# Patient Record
Sex: Male | Born: 1955
Health system: Southern US, Community
[De-identification: ages and names within clinical notes are randomized; demographics above are authoritative.]

## PROBLEM LIST (undated history)

## (undated) ENCOUNTER — Emergency Department (HOSPITAL_BASED_OUTPATIENT_CLINIC_OR_DEPARTMENT_OTHER): Payer: Self-pay | Source: Home / Self Care

## (undated) DIAGNOSIS — K458 Other specified abdominal hernia without obstruction or gangrene: Secondary | ICD-10-CM

## (undated) DIAGNOSIS — K219 Gastro-esophageal reflux disease without esophagitis: Secondary | ICD-10-CM

## (undated) DIAGNOSIS — M549 Dorsalgia, unspecified: Secondary | ICD-10-CM

## (undated) DIAGNOSIS — I10 Essential (primary) hypertension: Secondary | ICD-10-CM

## (undated) DIAGNOSIS — J189 Pneumonia, unspecified organism: Secondary | ICD-10-CM

## (undated) HISTORY — PX: FERTILITY SURGERY: SHX945

## (undated) HISTORY — PX: BACK SURGERY: SHX140

---

## 2009-07-19 ENCOUNTER — Emergency Department (HOSPITAL_BASED_OUTPATIENT_CLINIC_OR_DEPARTMENT_OTHER): Admission: EM | Admit: 2009-07-19 | Discharge: 2009-07-19 | Payer: Self-pay | Admitting: Emergency Medicine

## 2010-05-26 ENCOUNTER — Emergency Department (HOSPITAL_BASED_OUTPATIENT_CLINIC_OR_DEPARTMENT_OTHER): Admission: EM | Admit: 2010-05-26 | Discharge: 2010-05-27 | Payer: Self-pay | Admitting: Emergency Medicine

## 2010-07-10 ENCOUNTER — Emergency Department (HOSPITAL_BASED_OUTPATIENT_CLINIC_OR_DEPARTMENT_OTHER): Admission: EM | Admit: 2010-07-10 | Discharge: 2010-07-10 | Payer: Self-pay | Admitting: Emergency Medicine

## 2010-10-20 ENCOUNTER — Emergency Department (HOSPITAL_BASED_OUTPATIENT_CLINIC_OR_DEPARTMENT_OTHER)
Admission: EM | Admit: 2010-10-20 | Discharge: 2010-10-21 | Disposition: A | Discharge status: Home / Self Care | Payer: Self-pay | Attending: Emergency Medicine | Admitting: Emergency Medicine

## 2010-10-20 DIAGNOSIS — M109 Gout, unspecified: Secondary | ICD-10-CM | POA: Insufficient documentation

## 2010-10-20 DIAGNOSIS — M25579 Pain in unspecified ankle and joints of unspecified foot: Secondary | ICD-10-CM | POA: Insufficient documentation

## 2011-11-08 ENCOUNTER — Encounter (HOSPITAL_BASED_OUTPATIENT_CLINIC_OR_DEPARTMENT_OTHER): Payer: Self-pay

## 2011-11-08 ENCOUNTER — Emergency Department (HOSPITAL_BASED_OUTPATIENT_CLINIC_OR_DEPARTMENT_OTHER)
Admission: EM | Admit: 2011-11-08 | Discharge: 2011-11-08 | Disposition: A | Discharge status: Home / Self Care | Payer: Self-pay | Attending: Emergency Medicine | Admitting: Emergency Medicine

## 2011-11-08 DIAGNOSIS — M109 Gout, unspecified: Secondary | ICD-10-CM | POA: Insufficient documentation

## 2011-11-08 DIAGNOSIS — M5430 Sciatica, unspecified side: Secondary | ICD-10-CM | POA: Insufficient documentation

## 2011-11-08 MED ORDER — KETOROLAC TROMETHAMINE 60 MG/2ML IM SOLN
60.0000 mg | Freq: Once | INTRAMUSCULAR | Status: AC
  Administered 2011-11-08: 60 mg via INTRAMUSCULAR
  Filled 2011-11-08: qty 2

## 2011-11-08 MED ORDER — PREDNISONE 10 MG PO TABS: 20.0000 mg | ORAL_TABLET | Freq: Every day | ORAL | Status: DC

## 2011-11-08 MED ORDER — METHOCARBAMOL 750 MG PO TABS: 750.0000 mg | ORAL_TABLET | Freq: Four times a day (QID) | ORAL | Status: AC

## 2011-11-08 MED ORDER — OXYCODONE-ACETAMINOPHEN 5-325 MG PO TABS: 2.0000 | ORAL_TABLET | ORAL | Status: AC | PRN

## 2011-11-08 NOTE — ED Provider Notes (Signed)
History     CSN: [id number]  Arrival date & time 11/08/11  1051   First MD Initiated Contact with Patient 11/08/11 1125      Chief Complaint  Patient presents with  . Back Pain  . Dizziness    (Consider location/radiation/quality/duration/timing/severity/associated sxs/prior treatment) Patient is a 56 y.o. male presenting with back pain. The history is provided by the patient.  Back Pain    patient complains of pain to his right buttock x2 weeks after twisting. Denies any lumbar pain. Pain starts around his right sciatic notch area and radiates down his right leg. No change in his bowel or bladder function. Patient is now dragging his foot. Has been using Motrin with relief. Pain described as sharp worse with standing and movement better with rest. Denies any perineal numbness  Past Medical History  Diagnosis Date  . Gout     History reviewed. No pertinent past surgical history.  History reviewed. No pertinent family history.  History  Substance Use Topics  . Smoking status: Never Smoker   . Smokeless tobacco: Never Used  . Alcohol Use: No      Review of Systems  Musculoskeletal: Positive for back pain.  All other systems reviewed and are negative.    Allergies  Review of patient's allergies indicates no known allergies.  Home Medications   Current Outpatient Rx  Name Route Sig Dispense Refill  . DICLOFENAC SODIUM 50 MG PO TBEC Oral Take 50 mg by mouth 2 (two) times daily.      BP 153/88  Pulse 91  Temp(Src) 98.2 F (36.8 C) (Oral)  Resp 18  Ht 6\' 1"  (1.854 m)  Wt 245 lb (111.131 kg)  BMI 32.32 kg/m2  SpO2 95%  Physical Exam  Nursing note and vitals reviewed. Constitutional: He is oriented to person, place, and time. He appears well-developed and well-nourished.  Non-toxic appearance. No distress.  HENT:  Head: Normocephalic and atraumatic.  Eyes: Conjunctivae, EOM and lids are normal. Pupils are equal, round, and reactive to light.  Neck: Normal  range of motion. Neck supple. No tracheal deviation present. No mass present.  Cardiovascular: Normal rate, regular rhythm and normal heart sounds.  Exam reveals no gallop.   No murmur heard. Pulmonary/Chest: Effort normal and breath sounds normal. No stridor. No respiratory distress. He has no decreased breath sounds. He has no wheezes. He has no rhonchi. He has no rales.  Abdominal: Soft. Normal appearance and bowel sounds are normal. He exhibits no distension. There is no tenderness. There is no rebound and no CVA tenderness.  Musculoskeletal: Normal range of motion. He exhibits no edema and no tenderness.       Pain to palpation right buttock and sciatic notch.  Neurological: He is alert and oriented to person, place, and time. He has normal strength. No cranial nerve deficit or sensory deficit. GCS eye subscore is 4. GCS verbal subscore is 5. GCS motor subscore is 6.       Normal neurological function at low extremity  Skin: Skin is warm and dry. No abrasion and no rash noted.  Psychiatric: He has a normal mood and affect. His speech is normal and behavior is normal.    ED Course  Procedures (including critical care time)  Labs Reviewed - No data to display No results found.   No diagnosis found.    MDM  Patient to be treated for sciatica and discharged home. Given Toradol injection here        Ethelene Browns  Deanna Artis, MD 11/08/11 [phone]

## 2011-11-08 NOTE — Discharge Instructions (Signed)

## 2011-11-08 NOTE — ED Notes (Signed)
Pt states that he has R lower back pain for the past two weeks.  Pain radiates over the R hip and down the R leg.  Pt states that he is also having some nausea and lightheadedness.

## 2012-07-31 ENCOUNTER — Emergency Department (HOSPITAL_BASED_OUTPATIENT_CLINIC_OR_DEPARTMENT_OTHER)
Admission: EM | Admit: 2012-07-31 | Discharge: 2012-07-31 | Disposition: A | Discharge status: Home / Self Care | Payer: Self-pay | Attending: Emergency Medicine | Admitting: Emergency Medicine

## 2012-07-31 ENCOUNTER — Emergency Department (HOSPITAL_BASED_OUTPATIENT_CLINIC_OR_DEPARTMENT_OTHER): Payer: Self-pay

## 2012-07-31 ENCOUNTER — Encounter (HOSPITAL_BASED_OUTPATIENT_CLINIC_OR_DEPARTMENT_OTHER): Payer: Self-pay | Admitting: Emergency Medicine

## 2012-07-31 DIAGNOSIS — R109 Unspecified abdominal pain: Secondary | ICD-10-CM

## 2012-07-31 DIAGNOSIS — R1031 Right lower quadrant pain: Secondary | ICD-10-CM | POA: Insufficient documentation

## 2012-07-31 DIAGNOSIS — J189 Pneumonia, unspecified organism: Secondary | ICD-10-CM | POA: Insufficient documentation

## 2012-07-31 DIAGNOSIS — M109 Gout, unspecified: Secondary | ICD-10-CM | POA: Insufficient documentation

## 2012-07-31 DIAGNOSIS — Z8719 Personal history of other diseases of the digestive system: Secondary | ICD-10-CM | POA: Insufficient documentation

## 2012-07-31 DIAGNOSIS — Z791 Long term (current) use of non-steroidal anti-inflammatories (NSAID): Secondary | ICD-10-CM | POA: Insufficient documentation

## 2012-07-31 DIAGNOSIS — R350 Frequency of micturition: Secondary | ICD-10-CM | POA: Insufficient documentation

## 2012-07-31 DIAGNOSIS — Z79899 Other long term (current) drug therapy: Secondary | ICD-10-CM | POA: Insufficient documentation

## 2012-07-31 HISTORY — DX: Other specified abdominal hernia without obstruction or gangrene: K45.8

## 2012-07-31 HISTORY — DX: Pneumonia, unspecified organism: J18.9

## 2012-07-31 LAB — CBC WITH DIFFERENTIAL/PLATELET
Basophils Absolute: 0 10*3/uL (ref 0.0–0.1)
HCT: 44.8 % (ref 39.0–52.0)
Hemoglobin: 15.4 g/dL (ref 13.0–17.0)
MCH: 27.4 pg (ref 26.0–34.0)
MCHC: 34.4 g/dL (ref 30.0–36.0)
Monocytes Absolute: 0.8 10*3/uL (ref 0.1–1.0)
Neutrophils Relative %: 77 % (ref 43–77)
WBC: 5.7 10*3/uL (ref 4.0–10.5)

## 2012-07-31 LAB — COMPREHENSIVE METABOLIC PANEL
Albumin: 4 g/dL (ref 3.5–5.2)
Calcium: 9.4 mg/dL (ref 8.4–10.5)
Chloride: 101 mEq/L (ref 96–112)
Creatinine, Ser: 1.5 mg/dL — ABNORMAL HIGH (ref 0.50–1.35)
GFR calc non Af Amer: 50 mL/min — ABNORMAL LOW (ref >90)
Glucose, Bld: 92 mg/dL (ref 70–99)
Potassium: 3.9 mEq/L (ref 3.5–5.1)
Sodium: 140 mEq/L (ref 135–145)
Total Protein: 7.4 g/dL (ref 6.0–8.3)

## 2012-07-31 LAB — URINALYSIS, ROUTINE W REFLEX MICROSCOPIC
Leukocytes, UA: NEGATIVE
Nitrite: NEGATIVE
Protein, ur: NEGATIVE mg/dL
Specific Gravity, Urine: 1.019 (ref 1.005–1.030)
pH: 6 (ref 5.0–8.0)

## 2012-07-31 MED ORDER — SODIUM CHLORIDE 0.9 % IV BOLUS (SEPSIS)
1000.0000 mL | Freq: Once | INTRAVENOUS | Status: AC
  Administered 2012-07-31: 1000 mL via INTRAVENOUS

## 2012-07-31 MED ORDER — ONDANSETRON HCL 4 MG/2ML IJ SOLN
4.0000 mg | Freq: Once | INTRAMUSCULAR | Status: AC
  Administered 2012-07-31: 4 mg via INTRAVENOUS
  Filled 2012-07-31: qty 2

## 2012-07-31 MED ORDER — AZITHROMYCIN 250 MG PO TABS
500.0000 mg | ORAL_TABLET | Freq: Once | ORAL | Status: AC
  Administered 2012-07-31: 500 mg via ORAL
  Filled 2012-07-31: qty 2

## 2012-07-31 MED ORDER — IOHEXOL 300 MG/ML  SOLN
80.0000 mL | Freq: Once | INTRAMUSCULAR | Status: AC | PRN
  Administered 2012-07-31: 80 mL via INTRAVENOUS

## 2012-07-31 MED ORDER — MORPHINE SULFATE 4 MG/ML IJ SOLN
4.0000 mg | Freq: Once | INTRAMUSCULAR | Status: AC
  Administered 2012-07-31: 4 mg via INTRAVENOUS
  Filled 2012-07-31: qty 1

## 2012-07-31 MED ORDER — AZITHROMYCIN 250 MG PO TABS: 250.0000 mg | ORAL_TABLET | Freq: Every day | ORAL | Status: DC

## 2012-07-31 MED ORDER — OXYCODONE-ACETAMINOPHEN 5-325 MG PO TABS: 1.0000 | ORAL_TABLET | Freq: Four times a day (QID) | ORAL | Status: DC | PRN

## 2012-07-31 NOTE — ED Notes (Signed)
Pt reports cold symptoms x 3 days, reports headache, nausea, vomited once, has taken tylenol for headache but nothing for cold

## 2012-07-31 NOTE — ED Notes (Signed)
Pt also reports increased frequency of urination, dysuria and difficulty keeping stream going

## 2012-07-31 NOTE — ED Provider Notes (Signed)
History     CSN: [id number]  Arrival date & time 07/31/12  1649   First MD Initiated Contact with Patient 07/31/12 1713      Chief Complaint  Patient presents with  . Urinary Frequency    (Consider location/radiation/quality/duration/timing/severity/associated sxs/prior treatment) HPI Comments: Patient presents with feeling nauseated, frequent urination for the past several days.  Started this morning with right lower quadrant pain and vomiting.  He denies fevers or chills.    Patient is a 56 y.o. male presenting with abdominal pain. The history is provided by the patient.  Abdominal Pain The primary symptoms of the illness include abdominal pain. Episode onset: today. The onset of the illness was sudden. The problem has been gradually worsening.  Associated with: nothing. Additional symptoms associated with the illness include anorexia and frequency. Symptoms associated with the illness do not include chills or constipation.    Past Medical History  Diagnosis Date  . Gout   . Lumbar hernia     History reviewed. No pertinent past surgical history.  No family history on file.  History  Substance Use Topics  . Smoking status: Never Smoker   . Smokeless tobacco: Never Used  . Alcohol Use: No      Review of Systems  Constitutional: Negative for chills.  Gastrointestinal: Positive for abdominal pain and anorexia. Negative for constipation.  Genitourinary: Positive for frequency.  All other systems reviewed and are negative.    Allergies  Review of patient's allergies indicates no known allergies.  Home Medications   Current Outpatient Rx  Name  Route  Sig  Dispense  Refill  . PREDNISONE 10 MG PO TABS   Oral   Take 2 tablets (20 mg total) by mouth daily.   10 tablet   0   . PREGABALIN 100 MG PO CAPS   Oral   Take 100 mg by mouth 2 (two) times daily.         Marland Kitchen DICLOFENAC SODIUM 50 MG PO TBEC   Oral   Take 50 mg by mouth 2 (two) times daily.            BP 144/101  Pulse 115  Temp 99.1 F (37.3 C) (Oral)  Resp 22  Ht 6\' 1"  (1.854 m)  Wt 240 lb (108.863 kg)  BMI 31.66 kg/m2  SpO2 98%  Physical Exam  Nursing note and vitals reviewed. Constitutional: He is oriented to person, place, and time. He appears well-developed and well-nourished.       Appears uncomfortable.  HENT:  Head: Normocephalic and atraumatic.  Mouth/Throat: Oropharynx is clear and moist.  Neck: Normal range of motion. Neck supple.  Cardiovascular: Normal rate and regular rhythm.   No murmur heard. Pulmonary/Chest: Effort normal and breath sounds normal. No respiratory distress.  Abdominal: Soft. He exhibits no distension.       There is ttp in the right lower quadrant without rebound or guarding.  Musculoskeletal: Normal range of motion. He exhibits no edema.  Lymphadenopathy:    He has no cervical adenopathy.  Neurological: He is alert and oriented to person, place, and time.  Skin: Skin is warm and dry. He is not diaphoretic.    ED Course  Procedures (including critical care time)   Labs Reviewed  URINALYSIS, ROUTINE W REFLEX MICROSCOPIC  CBC WITH DIFFERENTIAL  COMPREHENSIVE METABOLIC PANEL   No results found.   No diagnosis found.    MDM  The patient presents here with complaints of feeling nauseated, vomiting, and having  lower abd pain.  He also reports being sick with cough and congestion for the past week.  The workup shows labs that are reassuring, ct that does not show any surgical pathology, and chest xray with the suggestion of a retrocardiac pneumonia.  Will treat with antibiotics, pain meds, return prn.        Geoffery Lyons, MD 07/31/12 [phone]

## 2012-09-02 ENCOUNTER — Emergency Department (HOSPITAL_BASED_OUTPATIENT_CLINIC_OR_DEPARTMENT_OTHER)
Admission: EM | Admit: 2012-09-02 | Discharge: 2012-09-02 | Disposition: A | Discharge status: Home / Self Care | Payer: Self-pay | Attending: Emergency Medicine | Admitting: Emergency Medicine

## 2012-09-02 ENCOUNTER — Encounter (HOSPITAL_BASED_OUTPATIENT_CLINIC_OR_DEPARTMENT_OTHER): Payer: Self-pay | Admitting: *Deleted

## 2012-09-02 DIAGNOSIS — Z79899 Other long term (current) drug therapy: Secondary | ICD-10-CM | POA: Insufficient documentation

## 2012-09-02 DIAGNOSIS — M545 Low back pain, unspecified: Secondary | ICD-10-CM | POA: Insufficient documentation

## 2012-09-02 DIAGNOSIS — Z862 Personal history of diseases of the blood and blood-forming organs and certain disorders involving the immune mechanism: Secondary | ICD-10-CM | POA: Insufficient documentation

## 2012-09-02 DIAGNOSIS — Z8639 Personal history of other endocrine, nutritional and metabolic disease: Secondary | ICD-10-CM | POA: Insufficient documentation

## 2012-09-02 DIAGNOSIS — Z8719 Personal history of other diseases of the digestive system: Secondary | ICD-10-CM | POA: Insufficient documentation

## 2012-09-02 DIAGNOSIS — M549 Dorsalgia, unspecified: Secondary | ICD-10-CM

## 2012-09-02 DIAGNOSIS — R209 Unspecified disturbances of skin sensation: Secondary | ICD-10-CM | POA: Insufficient documentation

## 2012-09-02 MED ORDER — ONDANSETRON 8 MG PO TBDP
ORAL_TABLET | ORAL | Status: AC
  Administered 2012-09-02: 8 mg
  Filled 2012-09-02: qty 1

## 2012-09-02 MED ORDER — CYCLOBENZAPRINE HCL 10 MG PO TABS
10.0000 mg | ORAL_TABLET | Freq: Once | ORAL | Status: AC
  Administered 2012-09-02: 10 mg via ORAL
  Filled 2012-09-02: qty 1

## 2012-09-02 MED ORDER — OXYCODONE-ACETAMINOPHEN 5-325 MG PO TABS: 2.0000 | ORAL_TABLET | ORAL | Status: DC | PRN

## 2012-09-02 MED ORDER — HYDROMORPHONE HCL PF 2 MG/ML IJ SOLN
2.0000 mg | Freq: Once | INTRAMUSCULAR | Status: AC
  Administered 2012-09-02: 2 mg via INTRAMUSCULAR
  Filled 2012-09-02: qty 1

## 2012-09-02 MED ORDER — CYCLOBENZAPRINE HCL 10 MG PO TABS: 10.0000 mg | ORAL_TABLET | Freq: Two times a day (BID) | ORAL | Status: DC | PRN

## 2012-09-02 NOTE — ED Notes (Signed)
Pt remains nauseated at this time ODT Zofran on board x 10 min

## 2012-09-02 NOTE — ED Provider Notes (Signed)
History     CSN: [id number]  Arrival date & time 09/02/12  1800   First MD Initiated Contact with Patient 09/02/12 1906      Chief Complaint  Patient presents with  . Back Pain    (Consider location/radiation/quality/duration/timing/severity/associated sxs/prior treatment) HPI Comments: Patient is planning on getting his back pain fixed, however is attempting to get some medical assistance like medicare or medicaid to help.    Patient is a 57 y.o. male presenting with back pain. The history is provided by the patient.  Back Pain  This is a chronic problem. The current episode started more than 2 days ago. The problem occurs constantly. The problem has not changed since onset.The pain is associated with no known injury. The pain is present in the lumbar spine. The quality of the pain is described as shooting. The pain radiates to the right thigh, right knee and right foot. The pain is at a severity of 10/10. The pain is severe. The symptoms are aggravated by twisting, bending and certain positions. The pain is the same all the time. Stiffness is present all day. Associated symptoms include tingling (R foot). Pertinent negatives include no chest pain, no fever, no abdominal pain, no abdominal swelling, no bowel incontinence, no perianal numbness, no bladder incontinence, no paresthesias, no paresis and no weakness.    Past Medical History  Diagnosis Date  . Gout   . Lumbar hernia     History reviewed. No pertinent past surgical history.  History reviewed. No pertinent family history.  History  Substance Use Topics  . Smoking status: Never Smoker   . Smokeless tobacco: Never Used  . Alcohol Use: No      Review of Systems  Constitutional: Negative for fever.  Respiratory: Negative for cough and shortness of breath.   Cardiovascular: Negative for chest pain.  Gastrointestinal: Negative for abdominal pain and bowel incontinence.  Genitourinary: Negative for bladder  incontinence.  Musculoskeletal: Positive for back pain.  Neurological: Positive for tingling (R foot). Negative for weakness and paresthesias.  All other systems reviewed and are negative.    Allergies  Review of patient's allergies indicates no known allergies.  Home Medications   Current Outpatient Rx  Name  Route  Sig  Dispense  Refill  . AZITHROMYCIN 250 MG PO TABS   Oral   Take 1 tablet (250 mg total) by mouth daily.   4 tablet   0   . DICLOFENAC SODIUM 50 MG PO TBEC   Oral   Take 50 mg by mouth 2 (two) times daily.         . OXYCODONE-ACETAMINOPHEN 5-325 MG PO TABS   Oral   Take 1-2 tablets by mouth every 6 (six) hours as needed for pain.   15 tablet   0   . PREDNISONE 10 MG PO TABS   Oral   Take 2 tablets (20 mg total) by mouth daily.   10 tablet   0   . PREGABALIN 100 MG PO CAPS   Oral   Take 100 mg by mouth 2 (two) times daily.           BP 161/109  Pulse 82  Temp 98.6 F (37 C) (Oral)  Resp 16  Ht 6\' 1"  (1.854 m)  Wt 250 lb (113.399 kg)  BMI 32.98 kg/m2  SpO2 98%  Physical Exam  Nursing note and vitals reviewed. Constitutional: He is oriented to person, place, and time. He appears well-developed and well-nourished. No distress.  HENT:  Head: Normocephalic and atraumatic.  Mouth/Throat: No oropharyngeal exudate.  Eyes: EOM are normal. Pupils are equal, round, and reactive to light.  Neck: Normal range of motion. Neck supple.  Cardiovascular: Normal rate and regular rhythm.  Exam reveals no friction rub.   No murmur heard. Pulmonary/Chest: Effort normal and breath sounds normal. No respiratory distress. He has no wheezes. He has no rales.  Abdominal: He exhibits no distension. There is no tenderness. There is no rebound.  Musculoskeletal: He exhibits no edema.       Lumbar back: He exhibits decreased range of motion and tenderness. He exhibits no spasm.  Neurological: He is alert and oriented to person, place, and time.  Skin: He is not  diaphoretic.    ED Course  Procedures (including critical care time)  Labs Reviewed - No data to display No results found.   1. Back pain       MDM   56 rolled male with history of bulging disc has been seen by spinal surgeon but has not had his back repaired presents with back pain. No acute worsening or acute event that these and a worsening. States the pain is on the right side radiates down the back of his right leg. Has some tingling in his foot. He complains of no bowel or bladder problems. He denies any perianal numbness. He denies fevers and IV drug use. No history of cancer. He has a normal set of vital signs. He has some lower back tenderness to palpation on the right side. He has +1 reflexes in his Achilles bilaterally and good strength and sensation in both his legs. I suspect his pain is due to his chronic back pain. Will give IM Dilaudid and given some pain meds to go home.        Elwin Mocha, MD 09/03/12 [phone]

## 2012-09-02 NOTE — ED Notes (Signed)
Pt c/o lower back pain which radiates down right leg x 3 days 

## 2012-09-06 NOTE — ED Provider Notes (Signed)
I saw and evaluated the patient, reviewed the resident's note and I agree with the findings and plan.  Brenten Janney K Linker, MD 09/06/12 1119 

## 2012-11-29 ENCOUNTER — Other Ambulatory Visit: Payer: Self-pay | Admitting: Neurosurgery

## 2012-11-29 DIAGNOSIS — M47817 Spondylosis without myelopathy or radiculopathy, lumbosacral region: Secondary | ICD-10-CM

## 2012-11-29 DIAGNOSIS — M48061 Spinal stenosis, lumbar region without neurogenic claudication: Secondary | ICD-10-CM

## 2012-11-29 DIAGNOSIS — M5137 Other intervertebral disc degeneration, lumbosacral region: Secondary | ICD-10-CM

## 2012-11-29 DIAGNOSIS — M545 Low back pain: Secondary | ICD-10-CM

## 2012-11-29 DIAGNOSIS — M5126 Other intervertebral disc displacement, lumbar region: Secondary | ICD-10-CM

## 2012-12-06 ENCOUNTER — Ambulatory Visit
Admission: RE | Admit: 2012-12-06 | Discharge: 2012-12-06 | Disposition: A | Discharge status: Home / Self Care | Payer: Medicaid Other | Source: Ambulatory Visit | Attending: Neurosurgery | Admitting: Neurosurgery

## 2012-12-06 DIAGNOSIS — M48061 Spinal stenosis, lumbar region without neurogenic claudication: Secondary | ICD-10-CM

## 2012-12-06 DIAGNOSIS — M545 Low back pain: Secondary | ICD-10-CM

## 2012-12-06 DIAGNOSIS — M5126 Other intervertebral disc displacement, lumbar region: Secondary | ICD-10-CM

## 2012-12-06 DIAGNOSIS — M47817 Spondylosis without myelopathy or radiculopathy, lumbosacral region: Secondary | ICD-10-CM

## 2012-12-06 DIAGNOSIS — M5137 Other intervertebral disc degeneration, lumbosacral region: Secondary | ICD-10-CM

## 2012-12-20 ENCOUNTER — Encounter (HOSPITAL_BASED_OUTPATIENT_CLINIC_OR_DEPARTMENT_OTHER): Payer: Self-pay | Admitting: *Deleted

## 2012-12-20 DIAGNOSIS — Z79899 Other long term (current) drug therapy: Secondary | ICD-10-CM | POA: Insufficient documentation

## 2012-12-20 DIAGNOSIS — Z791 Long term (current) use of non-steroidal anti-inflammatories (NSAID): Secondary | ICD-10-CM | POA: Insufficient documentation

## 2012-12-20 DIAGNOSIS — M51379 Other intervertebral disc degeneration, lumbosacral region without mention of lumbar back pain or lower extremity pain: Secondary | ICD-10-CM | POA: Insufficient documentation

## 2012-12-20 DIAGNOSIS — Z862 Personal history of diseases of the blood and blood-forming organs and certain disorders involving the immune mechanism: Secondary | ICD-10-CM | POA: Insufficient documentation

## 2012-12-20 DIAGNOSIS — Z8639 Personal history of other endocrine, nutritional and metabolic disease: Secondary | ICD-10-CM | POA: Insufficient documentation

## 2012-12-20 DIAGNOSIS — M5137 Other intervertebral disc degeneration, lumbosacral region: Secondary | ICD-10-CM | POA: Insufficient documentation

## 2012-12-20 DIAGNOSIS — Z8739 Personal history of other diseases of the musculoskeletal system and connective tissue: Secondary | ICD-10-CM | POA: Insufficient documentation

## 2012-12-20 NOTE — ED Notes (Signed)
Pt c/o lower back pain x 1 day 

## 2012-12-21 ENCOUNTER — Emergency Department (HOSPITAL_BASED_OUTPATIENT_CLINIC_OR_DEPARTMENT_OTHER)
Admission: EM | Admit: 2012-12-21 | Discharge: 2012-12-21 | Disposition: A | Discharge status: Home / Self Care | Payer: Medicaid Other | Attending: Emergency Medicine | Admitting: Emergency Medicine

## 2012-12-21 DIAGNOSIS — M5136 Other intervertebral disc degeneration, lumbar region: Secondary | ICD-10-CM

## 2012-12-21 MED ORDER — OXYCODONE-ACETAMINOPHEN 5-325 MG PO TABS: 1.0000 | ORAL_TABLET | Freq: Four times a day (QID) | ORAL | Status: DC | PRN

## 2012-12-21 MED ORDER — MELOXICAM 7.5 MG PO TABS: 7.5000 mg | ORAL_TABLET | Freq: Every day | ORAL | Status: DC

## 2012-12-21 MED ORDER — KETOROLAC TROMETHAMINE 60 MG/2ML IM SOLN
60.0000 mg | Freq: Once | INTRAMUSCULAR | Status: AC
  Administered 2012-12-21: 60 mg via INTRAMUSCULAR
  Filled 2012-12-21: qty 2

## 2012-12-21 MED ORDER — METHOCARBAMOL 500 MG PO TABS: 500.0000 mg | ORAL_TABLET | Freq: Two times a day (BID) | ORAL | Status: DC

## 2012-12-21 MED ORDER — METHOCARBAMOL 500 MG PO TABS
1000.0000 mg | ORAL_TABLET | Freq: Once | ORAL | Status: AC
  Administered 2012-12-21: 1000 mg via ORAL
  Filled 2012-12-21: qty 2

## 2012-12-21 MED ORDER — OXYCODONE-ACETAMINOPHEN 5-325 MG PO TABS
2.0000 | ORAL_TABLET | Freq: Once | ORAL | Status: AC
  Administered 2012-12-21: 2 via ORAL
  Filled 2012-12-21 (×2): qty 2

## 2012-12-21 NOTE — ED Provider Notes (Signed)
History     CSN: [id number]  Arrival date & time 12/20/12  2244   First MD Initiated Contact with Patient 12/21/12 [phone]      Chief Complaint  Patient presents with  . Back Pain    (Consider location/radiation/quality/duration/timing/severity/associated sxs/prior treatment) Patient is a 57 y.o. male presenting with back pain. The history is provided by the patient. No language interpreter was used.  Back Pain Location:  Lumbar spine Quality:  Stabbing Radiates to:  Does not radiate Pain severity:  Severe Pain is:  Same all the time Onset quality:  Gradual Duration:  12 months (more than 1 year) Progression:  Worsening Chronicity:  Chronic Context: not MCA and not MVA   Relieved by:  Nothing Worsened by:  Nothing tried Ineffective treatments:  Narcotics and NSAIDs Associated symptoms: no abdominal pain, no abdominal swelling, no bladder incontinence, no bowel incontinence, no chest pain, no dysuria, no fever, no headaches, no leg pain, no numbness, no paresthesias, no pelvic pain, no perianal numbness, no tingling, no weakness and no weight loss   Risk factors: no hx of cancer     Past Medical History  Diagnosis Date  . Gout   . Lumbar hernia     History reviewed. No pertinent past surgical history.  History reviewed. No pertinent family history.  History  Substance Use Topics  . Smoking status: Never Smoker   . Smokeless tobacco: Never Used  . Alcohol Use: No      Review of Systems  Constitutional: Negative for fever and weight loss.  Cardiovascular: Negative for chest pain.  Gastrointestinal: Negative for abdominal pain and bowel incontinence.  Genitourinary: Negative for bladder incontinence, dysuria and pelvic pain.  Musculoskeletal: Positive for back pain.  Neurological: Negative for tingling, weakness, numbness, headaches and paresthesias.  All other systems reviewed and are negative.    Allergies  Review of patient's allergies indicates no known  allergies.  Home Medications   Current Outpatient Rx  Name  Route  Sig  Dispense  Refill  . HYDROcodone-acetaminophen (NORCO/VICODIN) 5-325 MG per tablet   Oral   Take 1 tablet by mouth every 6 (six) hours as needed for pain.         Marland Kitchen azithromycin (ZITHROMAX) 250 MG tablet   Oral   Take 1 tablet (250 mg total) by mouth daily.   4 tablet   0   . cyclobenzaprine (FLEXERIL) 10 MG tablet   Oral   Take 1 tablet (10 mg total) by mouth 2 (two) times daily as needed for muscle spasms.   20 tablet   0   . diclofenac (VOLTAREN) 50 MG EC tablet   Oral   Take 50 mg by mouth 2 (two) times daily.         Marland Kitchen oxyCODONE-acetaminophen (PERCOCET) 5-325 MG per tablet   Oral   Take 2 tablets by mouth every 4 (four) hours as needed for pain.   20 tablet   0   . oxyCODONE-acetaminophen (PERCOCET/ROXICET) 5-325 MG per tablet   Oral   Take 1-2 tablets by mouth every 6 (six) hours as needed for pain.   15 tablet   0   . predniSONE (DELTASONE) 10 MG tablet   Oral   Take 2 tablets (20 mg total) by mouth daily.   10 tablet   0   . pregabalin (LYRICA) 100 MG capsule   Oral   Take 100 mg by mouth 2 (two) times daily.  BP 160/96  Pulse 96  Temp(Src) 98.5 F (36.9 C) (Oral)  Resp 18  Ht 6\' 1"  (1.854 m)  Wt 255 lb (115.667 kg)  BMI 33.65 kg/m2  SpO2 96%  Physical Exam  Constitutional: He is oriented to person, place, and time. He appears well-developed and well-nourished. No distress.  HENT:  Head: Normocephalic and atraumatic.  Mouth/Throat: Oropharynx is clear and moist.  Eyes: Conjunctivae are normal. Pupils are equal, round, and reactive to light.  Neck: Normal range of motion. Neck supple.  Cardiovascular: Normal rate, regular rhythm and intact distal pulses.   Pulmonary/Chest: Effort normal and breath sounds normal. He has no wheezes. He has no rales.  Abdominal: Soft. Bowel sounds are normal. There is no tenderness. There is no rebound and no guarding.   Musculoskeletal: Normal range of motion.  Neurological: He is alert and oriented to person, place, and time. He has normal reflexes. He exhibits normal muscle tone.  L5/s1 intact intact perineal sensation  Skin: Skin is warm and dry.    ED Course  Procedures (including critical care time)  Labs Reviewed - No data to display No results found.   No diagnosis found.    MDM  No new symptoms since MRI performed is following with Dr. Glee Arvin of neurosurgery will change meds to percocet and add a muscle relaxant and an anti inflamatory.         Jasmine Awe, MD 12/21/12 [phone]

## 2012-12-21 NOTE — ED Notes (Signed)
Pt c/o lower back pain. Pt had MRI 12/06/12. Pt states he only has vicodin at home for pain. Pt states he took a vicodin at 2pm yesterday without relief of pain.

## 2012-12-31 ENCOUNTER — Other Ambulatory Visit: Payer: Self-pay | Admitting: Neurosurgery

## 2012-12-31 ENCOUNTER — Encounter (HOSPITAL_COMMUNITY): Payer: Self-pay | Admitting: *Deleted

## 2012-12-31 ENCOUNTER — Encounter (HOSPITAL_COMMUNITY): Payer: Self-pay | Admitting: Pharmacy Technician

## 2012-12-31 NOTE — Progress Notes (Signed)
Pt said he had an EKG in the past year at [facility] . I faxed a request to [facility].  PCP is  [facility] in [place], I faxed results of [facility] Assessment.

## 2013-01-01 ENCOUNTER — Encounter (HOSPITAL_COMMUNITY)
Admission: RE | Disposition: A | Discharge status: Home / Self Care | Payer: Self-pay | Source: Ambulatory Visit | Attending: Neurosurgery

## 2013-01-01 ENCOUNTER — Encounter (HOSPITAL_COMMUNITY): Payer: Self-pay | Admitting: *Deleted

## 2013-01-01 ENCOUNTER — Other Ambulatory Visit: Payer: Self-pay

## 2013-01-01 ENCOUNTER — Ambulatory Visit (HOSPITAL_COMMUNITY): Payer: Medicaid Other

## 2013-01-01 ENCOUNTER — Ambulatory Visit (HOSPITAL_COMMUNITY): Payer: Medicaid Other | Admitting: *Deleted

## 2013-01-01 ENCOUNTER — Ambulatory Visit (HOSPITAL_COMMUNITY)
Admission: RE | Admit: 2013-01-01 | Discharge: 2013-01-03 | Disposition: A | Discharge status: Home / Self Care | Payer: Medicaid Other | Source: Ambulatory Visit | Attending: Neurosurgery | Admitting: Neurosurgery

## 2013-01-01 DIAGNOSIS — I10 Essential (primary) hypertension: Secondary | ICD-10-CM | POA: Insufficient documentation

## 2013-01-01 DIAGNOSIS — M5126 Other intervertebral disc displacement, lumbar region: Secondary | ICD-10-CM | POA: Insufficient documentation

## 2013-01-01 DIAGNOSIS — Z79899 Other long term (current) drug therapy: Secondary | ICD-10-CM | POA: Insufficient documentation

## 2013-01-01 HISTORY — DX: Gastro-esophageal reflux disease without esophagitis: K21.9

## 2013-01-01 HISTORY — PX: LUMBAR LAMINECTOMY/DECOMPRESSION MICRODISCECTOMY: SHX5026

## 2013-01-01 HISTORY — DX: Essential (primary) hypertension: I10

## 2013-01-01 HISTORY — DX: Pneumonia, unspecified organism: J18.9

## 2013-01-01 LAB — CBC
MCH: 27 pg (ref 26.0–34.0)
MCHC: 33.8 g/dL (ref 30.0–36.0)
Platelets: 155 10*3/uL (ref 150–400)
RDW: 12.9 % (ref 11.5–15.5)

## 2013-01-01 LAB — BASIC METABOLIC PANEL
BUN: 17 mg/dL (ref 6–23)
Calcium: 9.4 mg/dL (ref 8.4–10.5)
GFR calc non Af Amer: 43 mL/min — ABNORMAL LOW (ref >90)
Glucose, Bld: 95 mg/dL (ref 70–99)

## 2013-01-01 LAB — SURGICAL PCR SCREEN: MRSA, PCR: NEGATIVE

## 2013-01-01 SURGERY — LUMBAR LAMINECTOMY/DECOMPRESSION MICRODISCECTOMY 1 LEVEL
Anesthesia: General | Site: Back | Laterality: Right | Wound class: Clean

## 2013-01-01 MED ORDER — PHENOL 1.4 % MT LIQD: 1.0000 | OROMUCOSAL | Status: DC | PRN

## 2013-01-01 MED ORDER — HYDROMORPHONE HCL PF 1 MG/ML IJ SOLN
0.2500 mg | INTRAMUSCULAR | Status: DC | PRN
  Administered 2013-01-01 (×4): 0.5 mg via INTRAVENOUS

## 2013-01-01 MED ORDER — CEFAZOLIN SODIUM 1-5 GM-% IV SOLN
1.0000 g | Freq: Three times a day (TID) | INTRAVENOUS | Status: AC
  Administered 2013-01-01 (×2): 1 g via INTRAVENOUS
  Filled 2013-01-01 (×2): qty 50

## 2013-01-01 MED ORDER — PROPOFOL 10 MG/ML IV BOLUS
INTRAVENOUS | Status: DC | PRN
  Administered 2013-01-01: 200 mg via INTRAVENOUS

## 2013-01-01 MED ORDER — DEXAMETHASONE SODIUM PHOSPHATE 4 MG/ML IJ SOLN
INTRAMUSCULAR | Status: DC | PRN
  Administered 2013-01-01: 8 mg via INTRAVENOUS

## 2013-01-01 MED ORDER — MUPIROCIN 2 % EX OINT
TOPICAL_OINTMENT | CUTANEOUS | Status: AC
  Administered 2013-01-01: 1
  Filled 2013-01-01: qty 22

## 2013-01-01 MED ORDER — OXYCODONE-ACETAMINOPHEN 5-325 MG PO TABS
1.0000 | ORAL_TABLET | ORAL | Status: DC | PRN
  Administered 2013-01-01 – 2013-01-03 (×7): 2 via ORAL
  Filled 2013-01-01 (×7): qty 2

## 2013-01-01 MED ORDER — CYCLOBENZAPRINE HCL 10 MG PO TABS
10.0000 mg | ORAL_TABLET | Freq: Two times a day (BID) | ORAL | Status: DC | PRN
Start: 2013-01-01 — End: 2013-01-01

## 2013-01-01 MED ORDER — ACETAMINOPHEN 325 MG PO TABS: 650.0000 mg | ORAL_TABLET | ORAL | Status: DC | PRN

## 2013-01-01 MED ORDER — 0.9 % SODIUM CHLORIDE (POUR BTL) OPTIME
TOPICAL | Status: DC | PRN
  Administered 2013-01-01: 1000 mL

## 2013-01-01 MED ORDER — ONDANSETRON HCL 4 MG/2ML IJ SOLN: 4.0000 mg | Freq: Four times a day (QID) | INTRAMUSCULAR | Status: DC | PRN

## 2013-01-01 MED ORDER — SODIUM CHLORIDE 0.9 % IV SOLN
250.0000 mL | INTRAVENOUS | Status: DC
  Administered 2013-01-01: 250 mL via INTRAVENOUS

## 2013-01-01 MED ORDER — LIDOCAINE-EPINEPHRINE 1 %-1:100000 IJ SOLN
INTRAMUSCULAR | Status: DC | PRN
  Administered 2013-01-01: 10 mL via INTRADERMAL

## 2013-01-01 MED ORDER — OXYCODONE HCL 5 MG PO TABS: 5.0000 mg | ORAL_TABLET | Freq: Once | ORAL | Status: DC | PRN

## 2013-01-01 MED ORDER — PANTOPRAZOLE SODIUM 40 MG PO TBEC
40.0000 mg | DELAYED_RELEASE_TABLET | Freq: Every day | ORAL | Status: DC
  Administered 2013-01-01 – 2013-01-03 (×3): 40 mg via ORAL
  Filled 2013-01-01 (×3): qty 1

## 2013-01-01 MED ORDER — LIDOCAINE HCL (CARDIAC) 20 MG/ML IV SOLN
INTRAVENOUS | Status: DC | PRN
  Administered 2013-01-01: 80 mg via INTRAVENOUS

## 2013-01-01 MED ORDER — GLYCOPYRROLATE 0.2 MG/ML IJ SOLN
INTRAMUSCULAR | Status: DC | PRN
  Administered 2013-01-01: 0.6 mg via INTRAVENOUS

## 2013-01-01 MED ORDER — LACTATED RINGERS IV SOLN
INTRAVENOUS | Status: DC | PRN
  Administered 2013-01-01 (×2): via INTRAVENOUS

## 2013-01-01 MED ORDER — THROMBIN 5000 UNITS EX SOLR
CUTANEOUS | Status: DC | PRN
  Administered 2013-01-01 (×2): 5000 [IU] via TOPICAL

## 2013-01-01 MED ORDER — ROCURONIUM BROMIDE 100 MG/10ML IV SOLN
INTRAVENOUS | Status: DC | PRN
  Administered 2013-01-01: 40 mg via INTRAVENOUS

## 2013-01-01 MED ORDER — SODIUM CHLORIDE 0.9 % IV SOLN
INTRAVENOUS | Status: AC
  Filled 2013-01-01: qty 500

## 2013-01-01 MED ORDER — SUCCINYLCHOLINE CHLORIDE 20 MG/ML IJ SOLN
INTRAMUSCULAR | Status: DC | PRN
  Administered 2013-01-01: 140 mg via INTRAVENOUS

## 2013-01-01 MED ORDER — COLCHICINE 0.6 MG PO TABS
0.6000 mg | ORAL_TABLET | ORAL | Status: DC
Start: 2013-01-02 — End: 2013-01-03
  Administered 2013-01-02: 0.6 mg via ORAL
  Filled 2013-01-01: qty 1

## 2013-01-01 MED ORDER — HYDROMORPHONE HCL PF 1 MG/ML IJ SOLN
INTRAMUSCULAR | Status: AC
  Filled 2013-01-01: qty 1

## 2013-01-01 MED ORDER — BUPIVACAINE HCL (PF) 0.25 % IJ SOLN
INTRAMUSCULAR | Status: DC | PRN
  Administered 2013-01-01: 10 mL

## 2013-01-01 MED ORDER — LIDOCAINE HCL 4 % MT SOLN
OROMUCOSAL | Status: DC | PRN
  Administered 2013-01-01: 4 mL via TOPICAL

## 2013-01-01 MED ORDER — FENTANYL CITRATE 0.05 MG/ML IJ SOLN
INTRAMUSCULAR | Status: DC | PRN
  Administered 2013-01-01: 50 ug via INTRAVENOUS
  Administered 2013-01-01 (×2): 100 ug via INTRAVENOUS

## 2013-01-01 MED ORDER — ALUM & MAG HYDROXIDE-SIMETH 200-200-20 MG/5ML PO SUSP: 30.0000 mL | Freq: Four times a day (QID) | ORAL | Status: DC | PRN

## 2013-01-01 MED ORDER — CYCLOBENZAPRINE HCL 10 MG PO TABS
10.0000 mg | ORAL_TABLET | Freq: Three times a day (TID) | ORAL | Status: DC | PRN
  Administered 2013-01-01 – 2013-01-03 (×5): 10 mg via ORAL
  Filled 2013-01-01 (×5): qty 1

## 2013-01-01 MED ORDER — ONDANSETRON HCL 4 MG/2ML IJ SOLN
INTRAMUSCULAR | Status: DC | PRN
  Administered 2013-01-01: 4 mg via INTRAVENOUS

## 2013-01-01 MED ORDER — OXYCODONE HCL 5 MG/5ML PO SOLN: 5.0000 mg | Freq: Once | ORAL | Status: DC | PRN

## 2013-01-01 MED ORDER — HYDROMORPHONE HCL PF 1 MG/ML IJ SOLN
0.5000 mg | INTRAMUSCULAR | Status: DC | PRN
  Administered 2013-01-01: 1 mg via INTRAVENOUS
  Filled 2013-01-01: qty 1

## 2013-01-01 MED ORDER — CEFAZOLIN SODIUM-DEXTROSE 2-3 GM-% IV SOLR
INTRAVENOUS | Status: AC
  Administered 2013-01-01: 2 g via INTRAVENOUS
  Filled 2013-01-01: qty 50

## 2013-01-01 MED ORDER — SODIUM CHLORIDE 0.9 % IJ SOLN: 3.0000 mL | INTRAMUSCULAR | Status: DC | PRN

## 2013-01-01 MED ORDER — ACETAMINOPHEN 650 MG RE SUPP: 650.0000 mg | RECTAL | Status: DC | PRN

## 2013-01-01 MED ORDER — LISINOPRIL 5 MG PO TABS
5.0000 mg | ORAL_TABLET | Freq: Every day | ORAL | Status: DC
  Administered 2013-01-01 – 2013-01-03 (×3): 5 mg via ORAL
  Filled 2013-01-01 (×3): qty 1

## 2013-01-01 MED ORDER — SODIUM CHLORIDE 0.9 % IR SOLN
Status: DC | PRN
  Administered 2013-01-01: 10:00:00

## 2013-01-01 MED ORDER — MENTHOL 3 MG MT LOZG
1.0000 | LOZENGE | OROMUCOSAL | Status: DC | PRN
  Administered 2013-01-01: 3 mg via ORAL
  Filled 2013-01-01: qty 9

## 2013-01-01 MED ORDER — HYDROCODONE-ACETAMINOPHEN 5-325 MG PO TABS: 1.0000 | ORAL_TABLET | Freq: Four times a day (QID) | ORAL | Status: DC | PRN

## 2013-01-01 MED ORDER — BACITRACIN 50000 UNITS IM SOLR
INTRAMUSCULAR | Status: AC
  Filled 2013-01-01: qty 1

## 2013-01-01 MED ORDER — HEMOSTATIC AGENTS (NO CHARGE) OPTIME
TOPICAL | Status: DC | PRN
  Administered 2013-01-01: 1 via TOPICAL

## 2013-01-01 MED ORDER — ONDANSETRON HCL 4 MG/2ML IJ SOLN: 4.0000 mg | INTRAMUSCULAR | Status: DC | PRN

## 2013-01-01 MED ORDER — NEOSTIGMINE METHYLSULFATE 1 MG/ML IJ SOLN
INTRAMUSCULAR | Status: DC | PRN
  Administered 2013-01-01: 5 mg via INTRAVENOUS

## 2013-01-01 MED ORDER — SODIUM CHLORIDE 0.9 % IJ SOLN
3.0000 mL | Freq: Two times a day (BID) | INTRAMUSCULAR | Status: DC
  Administered 2013-01-01 – 2013-01-02 (×3): 3 mL via INTRAVENOUS

## 2013-01-01 MED ORDER — AMOXICILLIN 500 MG PO CAPS: 500.0000 mg | ORAL_CAPSULE | Freq: Two times a day (BID) | ORAL | Status: DC

## 2013-01-01 MED ORDER — MIDAZOLAM HCL 5 MG/5ML IJ SOLN
INTRAMUSCULAR | Status: DC | PRN
  Administered 2013-01-01: 2 mg via INTRAVENOUS

## 2013-01-01 MED ORDER — ARTIFICIAL TEARS OP OINT
TOPICAL_OINTMENT | OPHTHALMIC | Status: DC | PRN
  Administered 2013-01-01: 1 via OPHTHALMIC

## 2013-01-01 SURGICAL SUPPLY — 57 items
BAG DECANTER FOR FLEXI CONT (MISCELLANEOUS) ×2 IMPLANT
BENZOIN TINCTURE PRP APPL 2/3 (GAUZE/BANDAGES/DRESSINGS) ×2 IMPLANT
BLADE SURG 11 STRL SS (BLADE) ×2 IMPLANT
BLADE SURG ROTATE 9660 (MISCELLANEOUS) ×2 IMPLANT
BRUSH SCRUB EZ PLAIN DRY (MISCELLANEOUS) ×2 IMPLANT
BUR MATCHSTICK NEURO 3.0 LAGG (BURR) ×2 IMPLANT
BUR PRECISION FLUTE 6.0 (BURR) ×2 IMPLANT
CANISTER SUCTION 2500CC (MISCELLANEOUS) ×2 IMPLANT
CLOTH BEACON ORANGE TIMEOUT ST (SAFETY) ×2 IMPLANT
CONT SPEC 4OZ CLIKSEAL STRL BL (MISCELLANEOUS) ×2 IMPLANT
DECANTER SPIKE VIAL GLASS SM (MISCELLANEOUS) ×2 IMPLANT
DERMABOND ADVANCED (GAUZE/BANDAGES/DRESSINGS) ×1
DERMABOND ADVANCED .7 DNX12 (GAUZE/BANDAGES/DRESSINGS) ×1 IMPLANT
DRAPE LAPAROTOMY 100X72X124 (DRAPES) ×2 IMPLANT
DRAPE MICROSCOPE LEICA (MISCELLANEOUS) ×2 IMPLANT
DRAPE MICROSCOPE ZEISS OPMI (DRAPES) IMPLANT
DRAPE POUCH INSTRU U-SHP 10X18 (DRAPES) ×2 IMPLANT
DRAPE PROXIMA HALF (DRAPES) IMPLANT
DRAPE SURG 17X23 STRL (DRAPES) ×2 IMPLANT
DRESSING TELFA 8X3 (GAUZE/BANDAGES/DRESSINGS) ×2 IMPLANT
DRSG OPSITE 4X5.5 SM (GAUZE/BANDAGES/DRESSINGS) ×2 IMPLANT
DURAPREP 26ML APPLICATOR (WOUND CARE) ×2 IMPLANT
ELECT BLADE 4.0 EZ CLEAN MEGAD (MISCELLANEOUS) ×2
ELECT REM PT RETURN 9FT ADLT (ELECTROSURGICAL) ×2
ELECTRODE BLDE 4.0 EZ CLN MEGD (MISCELLANEOUS) ×1 IMPLANT
ELECTRODE REM PT RTRN 9FT ADLT (ELECTROSURGICAL) ×1 IMPLANT
GAUZE SPONGE 4X4 16PLY XRAY LF (GAUZE/BANDAGES/DRESSINGS) IMPLANT
GLOVE BIO SURGEON STRL SZ8 (GLOVE) ×2 IMPLANT
GLOVE BIOGEL PI IND STRL 8.5 (GLOVE) ×1 IMPLANT
GLOVE BIOGEL PI INDICATOR 8.5 (GLOVE) ×1
GLOVE EXAM NITRILE LRG STRL (GLOVE) IMPLANT
GLOVE EXAM NITRILE MD LF STRL (GLOVE) ×2 IMPLANT
GLOVE EXAM NITRILE XL STR (GLOVE) IMPLANT
GLOVE EXAM NITRILE XS STR PU (GLOVE) IMPLANT
GLOVE INDICATOR 8.5 STRL (GLOVE) ×2 IMPLANT
GLOVE SURG SS PI 8.0 STRL IVOR (GLOVE) ×4 IMPLANT
GOWN BRE IMP SLV AUR LG STRL (GOWN DISPOSABLE) IMPLANT
GOWN BRE IMP SLV AUR XL STRL (GOWN DISPOSABLE) ×4 IMPLANT
GOWN STRL REIN 2XL LVL4 (GOWN DISPOSABLE) ×2 IMPLANT
KIT BASIN OR (CUSTOM PROCEDURE TRAY) ×2 IMPLANT
KIT ROOM TURNOVER OR (KITS) ×2 IMPLANT
NEEDLE HYPO 22GX1.5 SAFETY (NEEDLE) ×2 IMPLANT
NEEDLE SPNL 22GX3.5 QUINCKE BK (NEEDLE) ×2 IMPLANT
NS IRRIG 1000ML POUR BTL (IV SOLUTION) ×2 IMPLANT
PACK LAMINECTOMY NEURO (CUSTOM PROCEDURE TRAY) ×2 IMPLANT
RUBBERBAND STERILE (MISCELLANEOUS) ×4 IMPLANT
SPONGE GAUZE 4X4 12PLY (GAUZE/BANDAGES/DRESSINGS) ×2 IMPLANT
SPONGE SURGIFOAM ABS GEL SZ50 (HEMOSTASIS) ×2 IMPLANT
STRIP CLOSURE SKIN 1/2X4 (GAUZE/BANDAGES/DRESSINGS) ×2 IMPLANT
SUT VIC AB 0 CT1 18XCR BRD8 (SUTURE) ×1 IMPLANT
SUT VIC AB 0 CT1 8-18 (SUTURE) ×1
SUT VIC AB 2-0 CT1 18 (SUTURE) ×2 IMPLANT
SUT VICRYL 4-0 PS2 18IN ABS (SUTURE) ×2 IMPLANT
SYR 20ML ECCENTRIC (SYRINGE) ×2 IMPLANT
TOWEL OR 17X24 6PK STRL BLUE (TOWEL DISPOSABLE) ×2 IMPLANT
TOWEL OR 17X26 10 PK STRL BLUE (TOWEL DISPOSABLE) ×2 IMPLANT
WATER STERILE IRR 1000ML POUR (IV SOLUTION) ×2 IMPLANT

## 2013-01-01 NOTE — Transfer of Care (Signed)
Immediate Anesthesia Transfer of Care Note  Patient: Aaron Marquez  Procedure(s) Performed: Procedure(s) with comments: LUMBAR LAMINECTOMY/DECOMPRESSION MICRODISCECTOMY 1 LEVEL (Right) - Lumbar Laminectomies Lumbar Four-Five, Decompression, Discectomy   Patient Location: PACU  Anesthesia Type:General  Level of Consciousness: sedated  Airway & Oxygen Therapy: Patient Spontanous Breathing and Patient connected to face mask oxygen  Post-op Assessment: Report given to PACU RN and Post -op Vital signs reviewed and stable  Post vital signs: Reviewed and stable  Complications: No apparent anesthesia complications

## 2013-01-01 NOTE — Op Note (Signed)
Preoperative diagnosis: Right L5 radiculopathy from herniated nucleus pulposus L4-5 right   Postoperative diagnosis: Same  Procedure: Lumbar laminectomy microdiscectomy L4-5 on the right with microscopic dissection of the right L4 nerve root microscopic discectomy.  Surgeon: Jillyn Hidden Onya Eutsler  Anesthesia: Gen.  EBL: Minimal  History of present illness: Patient is a very pleasant 57 year old some is a progress worsening back and probably right leg pain and tingling on for over a year now patient initially respond epidurals however has not responded recently. The pain would radiate down his back of his right leg and the back of his calf and top of his foot consistent with an L5 nerve root pattern imaging showed a disc herniation L4-5 displacing the right L5 nerve root and the patient takes her treatment progression of clinical syndrome and imaging findings I recommended laminectomy microdiscectomy I went over the risks benefits of the operation with him as well as perioperative course expectations about alternatives of surgery and he understands and agrees to proceed forward.  Operative procedure: Patient brought into the or was induced under general anesthesia positioned prone the Wilson frame his back was prepped and draped in routine sterile fashion. Preoperative x-ray localize the appropriate levels after infiltration of 10 cc lidocaine with epi a midline incision was made and Bovie left car was used to take down the subcutaneous tissues and subperiosteal dissections care lamina of L4-5 on the right. Interoperative X. identify the appropriate level so than the inferior aspect of the lamina of L4 medial facet complex and superior aspect of the lamina of L5 was drilled down a high-speed drill laminotomy was begun with a 3 minute Kerrison punch exposing the ligamentum flavum which was removed in piecemeal fashion exposing the thecal sac. Under microscopic illumination the L5 pedicle was palpated the L5 nerve  was identified and the foramen was unroofed to identify the medial margin of the L5 pedicle. Margins superiorly the disc spaces identified epidural veins are coagulated and using a 4 Penfield the nerve was dissected off of the disc herniations still partially contained within the ligament just on the inferior aspect of the disc space at L4-5 the disc space was incised with lumbar scalpel large free fragment was immediately teased out from underneath the L5 nerve root and the disc spaces cleanout pituitary rongeurs. At the discectomy there is no further stenosis of the L5 nerve root or central canal thecal sac the L5 foramen was widely patent the wounds and copiously irrigated meticulous hemostasis was maintained Gelfoam was overlaid top of the dura and the wounds closed in layers with interrupted Vicryl and the skin was closed with a running 4 subcuticular benzo and Steri-Strips were applied patient recovered in stable condition. At the end of case all needle counts and sponge counts were correct.

## 2013-01-01 NOTE — Anesthesia Preprocedure Evaluation (Addendum)
Anesthesia Evaluation  Patient identified by MRN, date of birth, ID band Patient awake    Reviewed: Allergy & Precautions, H&P , NPO status , Patient's Chart, lab work & pertinent test results, reviewed documented beta blocker date and time   Airway Mallampati: II  Neck ROM: full    Dental  (+) Edentulous Upper and Edentulous Lower   Pulmonary          Cardiovascular hypertension, Pt. on medications     Neuro/Psych    GI/Hepatic GERD-  Medicated,  Endo/Other  obese  Renal/GU      Musculoskeletal   Abdominal   Peds  Hematology   Anesthesia Other Findings   Reproductive/Obstetrics                          Anesthesia Physical Anesthesia Plan  ASA: II  Anesthesia Plan: General   Post-op Pain Management:    Induction: Intravenous  Airway Management Planned: Oral ETT  Additional Equipment:   Intra-op Plan:   Post-operative Plan: Extubation in OR  Informed Consent: I have reviewed the patients History and Physical, chart, labs and discussed the procedure including the risks, benefits and alternatives for the proposed anesthesia with the patient or authorized representative who has indicated his/her understanding and acceptance.   Dental advisory given  Plan Discussed with: CRNA, Anesthesiologist and Surgeon  Anesthesia Plan Comments:        Anesthesia Quick Evaluation

## 2013-01-01 NOTE — Preoperative (Signed)
Beta Blockers   Reason not to administer Beta Blockers:Not Applicable 

## 2013-01-01 NOTE — H&P (Signed)
Aaron Marquez is an 57 y.o. male.   Chief Complaint: Back and right leg pain HPI: Patient presents to 41-year-old gentleman sent over a year of back and probably right buttock and leg pain rating down to his hamstring and his calf in the top of foot his big toe he initially presented over year ago with more S1 root symptoms however those resolved and he developed more L5 root symptoms with pelvis foot and big toe experiencing numbness tingling and pain. He undergone previous epidural injections with Dr. Mariel [place] and responded initially very well however last several weeks and months of progressively worse. He denies any pain down his left leg denies any difficulty with bowel bladder the pain is been unrelenting and refractory anti-inflammatories physical therapy and narcotic pain medication. Imaging revealed lumbar spondylosis at 45 and 51 but the most significant was the right at L4-5 with it was a disc bulge possible herniation and severe lateral recess stenosis displacing the L5 nerve root on the right. This correlate with his clinical syndrome and due to his failure of conservative treatment imaging findings progression of clinical syndrome I recommended laminectomy microdiscectomy on the right L4-5. I went over the risks and benefits of the operation as well as perioperative course expectations of outcome alternatives of surgery and he understands and agrees to proceed forward.  Past Medical History  Diagnosis Date  . Gout   . Lumbar hernia   . Hypertension   . Pneumonia 07/31/12  . GERD (gastroesophageal reflux disease)     Past Surgical History  Procedure Laterality Date  . Fertility surgery      stent    History reviewed. No pertinent family history. Social History:  reports that he has never smoked. He has never used smokeless tobacco. He reports that he does not drink alcohol or use illicit drugs.  Allergies: No Known Allergies  Medications Prior to Admission  Medication  Sig Dispense Refill  . amoxicillin (AMOXIL) 500 MG capsule Take 500 mg by mouth 2 (two) times daily. On 10 day course for dental.      . colchicine 0.6 MG tablet Take 0.6 mg by mouth 2 (two) times a week.      Marland Kitchen HYDROcodone-acetaminophen (NORCO/VICODIN) 5-325 MG per tablet Take 1 tablet by mouth every 6 (six) hours as needed for pain.      Marland Kitchen lisinopril (PRINIVIL,ZESTRIL) 5 MG tablet Take 5 mg by mouth daily.      Marland Kitchen omeprazole (PRILOSEC) 20 MG capsule Take 20 mg by mouth 2 (two) times daily.      . cyclobenzaprine (FLEXERIL) 10 MG tablet Take 1 tablet (10 mg total) by mouth 2 (two) times daily as needed for muscle spasms.  20 tablet  0    Results for orders placed during the hospital encounter of 01/01/13 (from the past 48 hour(s))  BASIC METABOLIC PANEL     Status: Abnormal   Collection Time    01/01/13  7:17 AM      Result Value Range   Sodium 141  135 - 145 mEq/L   Potassium 3.8  3.5 - 5.1 mEq/L   Chloride 104  96 - 112 mEq/L   CO2 28  19 - 32 mEq/L   Glucose, Bld 95  70 - 99 mg/dL   BUN 17  6 - 23 mg/dL   Creatinine, Ser 2.95 (*) 0.50 - 1.35 mg/dL   Calcium 9.4  8.4 - 28.4 mg/dL   GFR calc non Af Amer 43 (*) >  90 mL/min   GFR calc Af Amer 49 (*) >90 mL/min   Comment:            The eGFR has been calculated     using the CKD EPI equation.     This calculation has not been     validated in all clinical     situations.     eGFR's persistently     <90 mL/min signify     possible Chronic Kidney Disease.  CBC     Status: Abnormal   Collection Time    01/01/13  7:17 AM      Result Value Range   WBC 4.8  4.0 - 10.5 K/uL   RBC 5.92 (*) 4.22 - 5.81 MIL/uL   Hemoglobin 16.0  13.0 - 17.0 g/dL   HCT 16.1  09.6 - 04.5 %   MCV 80.1  78.0 - 100.0 fL   MCH 27.0  26.0 - 34.0 pg   MCHC 33.8  30.0 - 36.0 g/dL   RDW 40.9  81.1 - 91.4 %   Platelets 155  150 - 400 K/uL   Dg Chest 2 View  01/01/2013   *RADIOLOGY REPORT*  Clinical Data: Preop lumbar surgery.  CHEST - 2 VIEW  Comparison:  07/31/2012  Findings: Heart is normal size.  Mediastinal contours within normal limits.  Lungs are clear.  No effusions.  No acute bony abnormality.  IMPRESSION: No active disease.   Original Report Authenticated By: Charlett Nose, M.D.    Review of Systems  Constitutional: Negative.   HENT: Negative.   Eyes: Negative.   Respiratory: Negative.   Cardiovascular: Negative.   Gastrointestinal: Negative.   Genitourinary: Negative.   Musculoskeletal: Positive for myalgias, back pain and joint pain.  Skin: Negative.   Neurological: Positive for tingling.  Endo/Heme/Allergies: Negative.   Psychiatric/Behavioral: Negative.     Blood pressure 141/92, pulse 92, temperature 98 F (36.7 C), temperature source Oral, resp. rate 20, height 6\' 1"  (1.854 m), weight 118.301 kg (260 lb 12.9 oz), SpO2 97.00%. Physical Exam  Constitutional: He is oriented to person, place, and time. He appears well-developed and well-nourished.  HENT:  Head: Normocephalic.  Eyes: Pupils are equal, round, and reactive to light.  Neck: Normal range of motion.  Cardiovascular: Normal rate.   Respiratory: Effort normal.  GI: Soft.  Musculoskeletal: Normal range of motion.  Neurological: He is alert and oriented to person, place, and time. He has normal strength. GCS eye subscore is 4. GCS verbal subscore is 5. GCS motor subscore is 6.  Reflex Scores:      Patellar reflexes are 0 on the right side and 0 on the left side.      Achilles reflexes are 0 on the right side and 0 on the left side. Strength is 5 out of 5 his iliopsoas, quads, hip she's, gastrocs, anterior tibialis, and EHL.     Assessment/Plan 56 year old presents for right-sided L4-5 laminectomy microdiscectomy  Terrea Bruster P 01/01/2013, 8:20 AM

## 2013-01-01 NOTE — Anesthesia Postprocedure Evaluation (Signed)
Anesthesia Post Note  Patient: Aaron Marquez  Procedure(s) Performed: Procedure(s) (LRB): LUMBAR LAMINECTOMY/DECOMPRESSION MICRODISCECTOMY 1 LEVEL (Right)  Anesthesia type: General  Patient location: PACU  Post pain: Pain level controlled and Adequate analgesia  Post assessment: Post-op Vital signs reviewed, Patient's Cardiovascular Status Stable, Respiratory Function Stable, Patent Airway and Pain level controlled  Last Vitals:  Filed Vitals:   01/01/13 1047  BP:   Pulse: 94  Temp:   Resp: 12    Post vital signs: Reviewed and stable  Level of consciousness: awake, alert  and oriented  Complications: No apparent anesthesia complications

## 2013-01-01 NOTE — Plan of Care (Signed)
Problem: Consults Goal: Diagnosis - Spinal Surgery Outcome: Completed/Met Date Met:  01/01/13 Microdiscectomy

## 2013-01-01 NOTE — Anesthesia Procedure Notes (Signed)
Procedure Name: Intubation Date/Time: 01/01/2013 8:59 AM Performed by: Aaron Marquez Pre-anesthesia Checklist: Patient identified, Emergency Drugs available, Suction available, Patient being monitored and Timeout performed Patient Re-evaluated:Patient Re-evaluated prior to inductionOxygen Delivery Method: Circle system utilized Preoxygenation: Pre-oxygenation with 100% oxygen Intubation Type: IV induction Ventilation: Mask ventilation without difficulty and Oral airway inserted - appropriate to patient size Laryngoscope Size: Mac and 3 Grade View: Grade II Tube type: Oral Tube size: 7.5 mm Number of attempts: 2 (DL x 1 with miler 2- unable to pick up epiglottis.  no attempt at placing ETT. Dl x 1 with MAC 3- grade2 view and succesful placement of ETT) Airway Equipment and Method: Lighted stylet Placement Confirmation: ETT inserted through vocal cords under direct vision Secured at: 23 cm Tube secured with: Tape Dental Injury: Teeth and Oropharynx as per pre-operative assessment

## 2013-01-02 NOTE — Progress Notes (Signed)
Subjective: Patient reports Overall is doing better leg pain is significantly improved he still has a low bit of irritation around the lateral aspect of his calf but is not experiencing any of that right upper hip and thigh pain  Objective: Vital signs in last 24 hours: Temp:  [96.9 F (36.1 C)-98.9 F (37.2 C)] 98.7 F (37.1 C) (05/29 0757) Pulse Rate:  [91-112] 96 (05/29 0757) Resp:  [10-18] 18 (05/29 0757) BP: (109-163)/(69-114) 122/83 mmHg (05/29 0757) SpO2:  [93 %-100 %] 96 % (05/29 0757)  Intake/Output from previous day: 05/28 0701 - 05/29 0700 In: 2100 [P.O.:600; I.V.:1500] Out: 725 [Urine:675; Blood:50] Intake/Output this shift:    Strength is 5 out of 5 wound is clean and dry  Lab Results:  Recent Labs  01/01/13 0717  WBC 4.8  HGB 16.0  HCT 47.4  PLT 155   BMET  Recent Labs  01/01/13 0717  NA 141  K 3.8  CL 104  CO2 28  GLUCOSE 95  BUN 17  CREATININE 1.72*  CALCIUM 9.4    Studies/Results: Dg Chest 2 View  01/01/2013   *RADIOLOGY REPORT*  Clinical Data: Preop lumbar surgery.  CHEST - 2 VIEW  Comparison: 07/31/2012  Findings: Heart is normal size.  Mediastinal contours within normal limits.  Lungs are clear.  No effusions.  No acute bony abnormality.  IMPRESSION: No active disease.   Original Report Authenticated By: Charlett Nose, M.D.   Dg Lumbar Spine 2-3 Views  01/01/2013   *RADIOLOGY REPORT*  Clinical Data: Instrument localization for right-sided L4 - L5 laminectomy  LUMBAR SPINE - 2-3 VIEW  Comparison: Lumbar spine MRI - 12/06/2012  Findings:  A single spot lateral projection intraoperative radiograph of the lumbar spine is provided for review.  Lumbar spine labeling is seen keeping with preprocedural lumbar spine MRI.  A radiopaque marking needle overlies the soft tissues posterior and superior to the L4 spinous process.  IMPRESSION: Intraoperative lumbar spine localization as above.   Original Report Authenticated By: Tacey Ruiz, MD     Assessment/Plan: Patient still has significant issues of pain control he is annulotomy his voiding but doesn't feel like the pain is under well and controlled he discharged so we'll continue the patient one more day observation with continued work to get his pain under control mobilization ambulation and we'll plan discharge tomorrow  LOS: 1 day     Aaron Marquez P 01/02/2013, 9:40 AM

## 2013-01-03 ENCOUNTER — Encounter (HOSPITAL_COMMUNITY): Payer: Self-pay | Admitting: Neurosurgery

## 2013-01-03 MED ORDER — CYCLOBENZAPRINE HCL 10 MG PO TABS: 10.0000 mg | ORAL_TABLET | Freq: Three times a day (TID) | ORAL | Status: DC | PRN

## 2013-01-03 MED ORDER — OXYCODONE-ACETAMINOPHEN 5-325 MG PO TABS: 1.0000 | ORAL_TABLET | ORAL | Status: DC | PRN

## 2013-01-03 NOTE — Progress Notes (Signed)
Pt given D/C instructions with Rx's, verbal understanding given. Pt D/C'd home via wheelchair @ 1035 per MD order. Rema Fendt, RN

## 2013-01-03 NOTE — Progress Notes (Signed)
Patient ID: Aaron Marquez, male   DOB: 04-26-1956, 57 y.o.   MRN: [id number] Patient states is doing well with improved leg pain he is ambulating he is voiding.  Strength out of 5 wound clean and dry.  Discharge him

## 2013-01-03 NOTE — Discharge Summary (Signed)
  Physician Discharge Summary  Patient ID: Aaron Marquez MRN: [id number] DOB/AGE: 11-Jan-1956 57 y.o.  Admit date: 01/01/2013 Discharge date: 01/03/2013  Admission Diagnoses: Right L5 radiculopathy from ruptured disc L4-5 right  Discharge Diagnoses: Same Active Problems:   * No active hospital problems. *   Discharged Condition: good  Hospital Course: Patient was admitted hospital underwent a laminectomy microdiscectomy postop patient did fairly well however a posterior they want still had some difficulties with pain control had some improvement is likely in the back pain was much worse he was very slow to mobilize. He was kept an additional day progressively mobilized today much better was stable and be discharged home on postop day 2  Consults: Significant Diagnostic Studies: Treatments: Right-sided L4-5 laminectomy discectomy Discharge Exam: Blood pressure 115/83, pulse 94, temperature 98.4 F (36.9 C), temperature source Oral, resp. rate 18, height 6\' 1"  (1.854 m), weight 118.301 kg (260 lb 12.9 oz), SpO2 96.00%. Strength out of 5 wound clean and  Disposition: Home     Medication List    TAKE these medications       amoxicillin 500 MG capsule  Commonly known as:  AMOXIL  Take 500 mg by mouth 2 (two) times daily. On 10 day course for dental.     colchicine 0.6 MG tablet  Take 0.6 mg by mouth 2 (two) times a week.     cyclobenzaprine 10 MG tablet  Commonly known as:  FLEXERIL  Take 1 tablet (10 mg total) by mouth 2 (two) times daily as needed for muscle spasms.     cyclobenzaprine 10 MG tablet  Commonly known as:  FLEXERIL  Take 1 tablet (10 mg total) by mouth 3 (three) times daily as needed for muscle spasms.     HYDROcodone-acetaminophen 5-325 MG per tablet  Commonly known as:  NORCO/VICODIN  Take 1 tablet by mouth every 6 (six) hours as needed for pain.     lisinopril 5 MG tablet  Commonly known as:  PRINIVIL,ZESTRIL  Take 5 mg by mouth daily.     omeprazole  20 MG capsule  Commonly known as:  PRILOSEC  Take 20 mg by mouth 2 (two) times daily.     oxyCODONE-acetaminophen 5-325 MG per tablet  Commonly known as:  PERCOCET/ROXICET  Take 1-2 tablets by mouth every 4 (four) hours as needed.           Follow-up Information   Follow up with Halston Fairclough P, MD.   Contact information:   [address] [place] [address] [phone]       Signed: Karmela Bram P 01/03/2013, 7:19 AM

## 2013-01-07 ENCOUNTER — Emergency Department (HOSPITAL_BASED_OUTPATIENT_CLINIC_OR_DEPARTMENT_OTHER): Payer: Medicaid Other

## 2013-01-07 ENCOUNTER — Emergency Department (HOSPITAL_BASED_OUTPATIENT_CLINIC_OR_DEPARTMENT_OTHER)
Admission: EM | Admit: 2013-01-07 | Discharge: 2013-01-07 | Disposition: A | Discharge status: Home / Self Care | Payer: Medicaid Other | Attending: Emergency Medicine | Admitting: Emergency Medicine

## 2013-01-07 ENCOUNTER — Encounter (HOSPITAL_BASED_OUTPATIENT_CLINIC_OR_DEPARTMENT_OTHER): Payer: Self-pay

## 2013-01-07 DIAGNOSIS — Z79899 Other long term (current) drug therapy: Secondary | ICD-10-CM | POA: Insufficient documentation

## 2013-01-07 DIAGNOSIS — Z8701 Personal history of pneumonia (recurrent): Secondary | ICD-10-CM | POA: Insufficient documentation

## 2013-01-07 DIAGNOSIS — M545 Low back pain, unspecified: Secondary | ICD-10-CM | POA: Insufficient documentation

## 2013-01-07 DIAGNOSIS — I10 Essential (primary) hypertension: Secondary | ICD-10-CM | POA: Insufficient documentation

## 2013-01-07 DIAGNOSIS — K219 Gastro-esophageal reflux disease without esophagitis: Secondary | ICD-10-CM | POA: Insufficient documentation

## 2013-01-07 DIAGNOSIS — Z862 Personal history of diseases of the blood and blood-forming organs and certain disorders involving the immune mechanism: Secondary | ICD-10-CM | POA: Insufficient documentation

## 2013-01-07 DIAGNOSIS — Z8639 Personal history of other endocrine, nutritional and metabolic disease: Secondary | ICD-10-CM | POA: Insufficient documentation

## 2013-01-07 DIAGNOSIS — Z8719 Personal history of other diseases of the digestive system: Secondary | ICD-10-CM | POA: Insufficient documentation

## 2013-01-07 DIAGNOSIS — M549 Dorsalgia, unspecified: Secondary | ICD-10-CM

## 2013-01-07 LAB — CBC WITH DIFFERENTIAL/PLATELET
Basophils Absolute: 0 10*3/uL (ref 0.0–0.1)
Eosinophils Absolute: 0.1 10*3/uL (ref 0.0–0.7)
Eosinophils Relative: 3 % (ref 0–5)
MCH: 27.6 pg (ref 26.0–34.0)
MCHC: 34.9 g/dL (ref 30.0–36.0)
MCV: 79 fL (ref 78.0–100.0)
Platelets: 155 10*3/uL (ref 150–400)
RDW: 12.6 % (ref 11.5–15.5)

## 2013-01-07 LAB — COMPREHENSIVE METABOLIC PANEL
ALT: 24 U/L (ref 0–53)
AST: 19 U/L (ref 0–37)
Calcium: 10.1 mg/dL (ref 8.4–10.5)
GFR calc Af Amer: 54 mL/min — ABNORMAL LOW (ref >90)
Glucose, Bld: 108 mg/dL — ABNORMAL HIGH (ref 70–99)
Sodium: 141 mEq/L (ref 135–145)
Total Protein: 7.5 g/dL (ref 6.0–8.3)

## 2013-01-07 MED ORDER — IOHEXOL 300 MG/ML  SOLN
100.0000 mL | Freq: Once | INTRAMUSCULAR | Status: AC | PRN
  Administered 2013-01-07: 100 mL via INTRAVENOUS

## 2013-01-07 NOTE — ED Notes (Signed)
Patient transported to CT 

## 2013-01-07 NOTE — ED Notes (Signed)
MD at bedside. 

## 2013-01-07 NOTE — ED Provider Notes (Signed)
History     CSN: [id number]  Arrival date & time 01/07/13  1716   First MD Initiated Contact with Patient 01/07/13 1729      Chief Complaint  Patient presents with  . Back Pain    (Consider location/radiation/quality/duration/timing/severity/associated sxs/prior treatment) HPI Comments: Patient with recent surgery to the lumbar spine 5 days ago.  He presents complaining of increased pain and swelling to the right lumbar region.  There is no bony ttp and the incision appears to be healing appropriately.  He denies any bowel or bladder complaints.  No fevers or chills.    Patient is a 57 y.o. male presenting with back pain. The history is provided by the patient.  Back Pain Location:  Lumbar spine Quality:  Stabbing Radiates to:  Does not radiate Pain severity:  Severe Pain is:  Same all the time Onset quality:  Gradual   Past Medical History  Diagnosis Date  . Gout   . Lumbar hernia   . Hypertension   . Pneumonia 07/31/12  . GERD (gastroesophageal reflux disease)     Past Surgical History  Procedure Laterality Date  . Fertility surgery      stent  . Lumbar laminectomy/decompression microdiscectomy Right 01/01/2013    Procedure: LUMBAR LAMINECTOMY/DECOMPRESSION MICRODISCECTOMY 1 LEVEL;  Surgeon: Mariam Dollar, MD;  Location: [facility];  Service: Neurosurgery;  Laterality: Right;  Lumbar Laminectomies Lumbar Four-Five, Decompression, Discectomy     No family history on file.  History  Substance Use Topics  . Smoking status: Never Smoker   . Smokeless tobacco: Never Used  . Alcohol Use: No      Review of Systems  Musculoskeletal: Positive for back pain.  All other systems reviewed and are negative.    Allergies  Review of patient's allergies indicates no known allergies.  Home Medications   Current Outpatient Rx  Name  Route  Sig  Dispense  Refill  . amoxicillin (AMOXIL) 500 MG capsule   Oral   Take 500 mg by mouth 2 (two) times daily. On 10 day course  for dental.         . colchicine 0.6 MG tablet   Oral   Take 0.6 mg by mouth 2 (two) times a week.         . cyclobenzaprine (FLEXERIL) 10 MG tablet   Oral   Take 1 tablet (10 mg total) by mouth 2 (two) times daily as needed for muscle spasms.   20 tablet   0   . cyclobenzaprine (FLEXERIL) 10 MG tablet   Oral   Take 1 tablet (10 mg total) by mouth 3 (three) times daily as needed for muscle spasms.   80 tablet   1   . HYDROcodone-acetaminophen (NORCO/VICODIN) 5-325 MG per tablet   Oral   Take 1 tablet by mouth every 6 (six) hours as needed for pain.         Marland Kitchen lisinopril (PRINIVIL,ZESTRIL) 5 MG tablet   Oral   Take 5 mg by mouth daily.         Marland Kitchen omeprazole (PRILOSEC) 20 MG capsule   Oral   Take 20 mg by mouth 2 (two) times daily.         Marland Kitchen oxyCODONE-acetaminophen (PERCOCET/ROXICET) 5-325 MG per tablet   Oral   Take 1-2 tablets by mouth every 4 (four) hours as needed.   80 tablet   0     BP 172/108  Temp(Src) 99.2 F (37.3 C) (Oral)  Resp 16  Ht 6\' 1"  (1.854 m)  Wt 260 lb (117.935 kg)  BMI 34.31 kg/m2  SpO2 98%  Physical Exam  Nursing note and vitals reviewed. Constitutional: He is oriented to person, place, and time. He appears well-developed and well-nourished. No distress.  HENT:  Head: Normocephalic and atraumatic.  Mouth/Throat: Oropharynx is clear and moist.  Neck: Normal range of motion. Neck supple.  Musculoskeletal: Normal range of motion.       Arms: There is a large, swollen are and ttp of the right lumbar region.      Neurological: He is alert and oriented to person, place, and time.  Strength is 5/5 in the ble.  Able to ambulate, but with pain.  Skin: Skin is warm and dry. He is not diaphoretic.    ED Course  Procedures (including critical care time)  Labs Reviewed  CBC WITH DIFFERENTIAL  COMPREHENSIVE METABOLIC PANEL   No results found.   No diagnosis found.    MDM  There is no evidence for spinal leak or hematoma on  the ct scan.  He appears comfortable and does not appear toxic.  He is able to stand and walk without difficulty and there is no bowel or bladder complaint.  Will discharge with continued pain meds, follow up with his surgeon in the next two days if not improving.          Geoffery Lyons, MD 01/07/13 [phone]

## 2013-01-07 NOTE — ED Notes (Signed)
C/o pain /swelling to right lower back this am-surgery 1 week ago to back-states he called surgeon office and was advised to come to ED-f/iu appt is in 2 weeks

## 2013-05-09 ENCOUNTER — Encounter (HOSPITAL_BASED_OUTPATIENT_CLINIC_OR_DEPARTMENT_OTHER): Payer: Self-pay

## 2013-05-09 ENCOUNTER — Emergency Department (HOSPITAL_BASED_OUTPATIENT_CLINIC_OR_DEPARTMENT_OTHER)
Admission: EM | Admit: 2013-05-09 | Discharge: 2013-05-09 | Disposition: A | Discharge status: Home / Self Care | Payer: Medicaid Other | Attending: Emergency Medicine | Admitting: Emergency Medicine

## 2013-05-09 DIAGNOSIS — Z8701 Personal history of pneumonia (recurrent): Secondary | ICD-10-CM | POA: Insufficient documentation

## 2013-05-09 DIAGNOSIS — K219 Gastro-esophageal reflux disease without esophagitis: Secondary | ICD-10-CM | POA: Insufficient documentation

## 2013-05-09 DIAGNOSIS — M109 Gout, unspecified: Secondary | ICD-10-CM | POA: Insufficient documentation

## 2013-05-09 DIAGNOSIS — Z79899 Other long term (current) drug therapy: Secondary | ICD-10-CM | POA: Insufficient documentation

## 2013-05-09 DIAGNOSIS — I10 Essential (primary) hypertension: Secondary | ICD-10-CM | POA: Insufficient documentation

## 2013-05-09 MED ORDER — OXYCODONE-ACETAMINOPHEN 5-325 MG PO TABS
2.0000 | ORAL_TABLET | Freq: Once | ORAL | Status: AC
Start: 2013-05-09 — End: 2013-05-09
  Administered 2013-05-09: 2 via ORAL
  Filled 2013-05-09: qty 2

## 2013-05-09 MED ORDER — OXYCODONE-ACETAMINOPHEN 5-325 MG PO TABS: 1.0000 | ORAL_TABLET | ORAL | Status: DC | PRN

## 2013-05-09 MED ORDER — PREDNISONE 10 MG PO TABS: 20.0000 mg | ORAL_TABLET | Freq: Every day | ORAL | Status: DC

## 2013-05-09 MED ORDER — PREDNISONE 50 MG PO TABS
60.0000 mg | ORAL_TABLET | Freq: Once | ORAL | Status: AC
  Administered 2013-05-09: 60 mg via ORAL
  Filled 2013-05-09 (×2): qty 1

## 2013-05-09 NOTE — ED Provider Notes (Signed)
CSN: [id number]     Arrival date & time 05/09/13  2131 History  This chart was scribed for Hilario Quarry, MD by Ronal Fear, ED Scribe. This patient was seen in room MH04/MH04 and the patient's care was started at 9:46 PM.    Chief Complaint  Patient presents with  . Elbow Pain    The history is provided by the patient. No language interpreter was used.  HPI Comments: Aaron Marquez is a 57 y.o. male with a hx of gout who presents to the Emergency Department complaining of elbow pain. The pain in his elbows feels like a flare up of gout onset 5x days ago.  Pt went to see his PCP 2x days ago and was given colchicein for the gout. Pt has a hx of gout that began in his late 40's typically in his feet and elbows but today's episode is the worst yet. Pt's shoulders have full rom. Pt denies any other medical problem, or allergies. Pt has a family hx of gout. A few months back pt was given steroids that relieved his pain.   Past Medical History  Diagnosis Date  . Gout   . Lumbar hernia   . Hypertension   . Pneumonia 07/31/12  . GERD (gastroesophageal reflux disease)    Past Surgical History  Procedure Laterality Date  . Fertility surgery      stent  . Lumbar laminectomy/decompression microdiscectomy Right 01/01/2013    Procedure: LUMBAR LAMINECTOMY/DECOMPRESSION MICRODISCECTOMY 1 LEVEL;  Surgeon: Mariam Dollar, MD;  Location: [facility];  Service: Neurosurgery;  Laterality: Right;  Lumbar Laminectomies Lumbar Four-Five, Decompression, Discectomy   . Back surgery     No family history on file. History  Substance Use Topics  . Smoking status: Never Smoker   . Smokeless tobacco: Never Used  . Alcohol Use: No    Review of Systems  Musculoskeletal: Positive for arthralgias.  All other systems reviewed and are negative.    Allergies  Review of patient's allergies indicates no known allergies.  Home Medications   Current Outpatient Rx  Name  Route  Sig  Dispense  Refill  .  amoxicillin (AMOXIL) 500 MG capsule   Oral   Take 500 mg by mouth 2 (two) times daily. On 10 day course for dental.         . colchicine 0.6 MG tablet   Oral   Take 0.6 mg by mouth 2 (two) times a week.         . cyclobenzaprine (FLEXERIL) 10 MG tablet   Oral   Take 1 tablet (10 mg total) by mouth 2 (two) times daily as needed for muscle spasms.   20 tablet   0   . cyclobenzaprine (FLEXERIL) 10 MG tablet   Oral   Take 1 tablet (10 mg total) by mouth 3 (three) times daily as needed for muscle spasms.   80 tablet   1   . HYDROcodone-acetaminophen (NORCO/VICODIN) 5-325 MG per tablet   Oral   Take 1 tablet by mouth every 6 (six) hours as needed for pain.         Marland Kitchen lisinopril (PRINIVIL,ZESTRIL) 5 MG tablet   Oral   Take 5 mg by mouth daily.         Marland Kitchen omeprazole (PRILOSEC) 20 MG capsule   Oral   Take 20 mg by mouth 2 (two) times daily.         Marland Kitchen oxyCODONE-acetaminophen (PERCOCET/ROXICET) 5-325 MG per tablet   Oral  Take 1-2 tablets by mouth every 4 (four) hours as needed.   80 tablet   0    BP 156/93  Pulse 96  Temp(Src) 98.7 F (37.1 C) (Oral)  Resp 20  Ht 6\' 1"  (1.854 m)  Wt 264 lb (119.75 kg)  BMI 34.84 kg/m2  SpO2 97% Physical Exam  Nursing note and vitals reviewed. Constitutional: He is oriented to person, place, and time. He appears well-developed and well-nourished. No distress.  HENT:  Head: Normocephalic and atraumatic.  Eyes: EOM are normal.  Neck: Neck supple. No tracheal deviation present.  Cardiovascular: Normal rate.   Pulmonary/Chest: Effort normal. No respiratory distress.  Musculoskeletal: He exhibits tenderness.  . Shoulders have full ROM.  Pain with swelling bilateral elbows with limited rom   Neurological: He is alert and oriented to person, place, and time.  Skin: Skin is warm and dry.  Psychiatric: He has a normal mood and affect. His behavior is normal.    ED Course  Procedures (including critical care time)  DIAGNOSTIC  STUDIES: Oxygen Saturation is 97% on RA, adequate by my interpretation.    COORDINATION OF CARE: 9:53 PM- Pt advised of plan for treatment prescription steroids and pain medications for his gout and pt agrees.       Labs Review Labs Reviewed - No data to display Imaging Review No results found.  MDM  No diagnosis found.  I personally performed the services described in this documentation, which was scribed in my presence. The recorded information has been reviewed and considered.    Hilario Quarry, MD 05/09/13 [phone]

## 2013-05-09 NOTE — ED Notes (Signed)
C/o "gout to both elbows" x 4 days-seen by PCP this week-was given injection and pain med-pain unrelieved

## 2013-09-19 IMAGING — CT CT L SPINE W/ CM
3 of 4 series · 12 of 33 positions shown, 13 images · IV contrast (omnipaque)
Comparison: MRI lumbar spine 12/06/2012.

CLINICAL DATA: Recent back surgery.  Right lumbar region swelling.

CT LUMBAR SPINE WITH CONTRAST
TECHNIQUE: Multidetector CT imaging of the lumbar spine was
performed with intravenous contrast administration. Multiplanar CT
image reconstructions were also generated. Body habitus is
increased, decreasing image quality.
Contrast: 100mL OMNIPAQUE IOHEXOL 300 MG/ML  SOLN

[Series 3: spine 2.0 b31s st · axial · 0.31mm/px · z∈[-294,-100]mm · 4 of 141 slices shown, 5 images]
[im 22/141  soft-tissue]
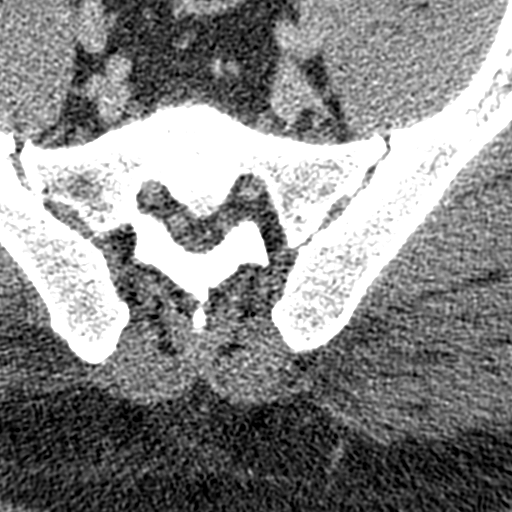
[im 22/141  bone]
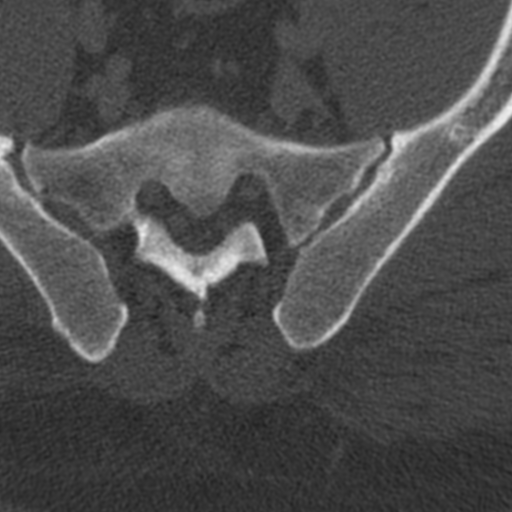
[im 54/141  bone]
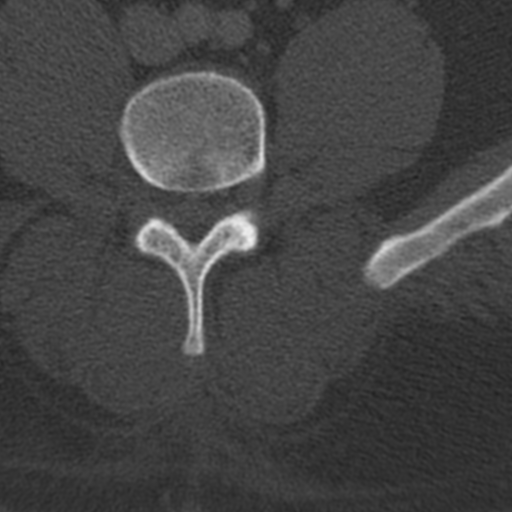
[im 87/141  bone]
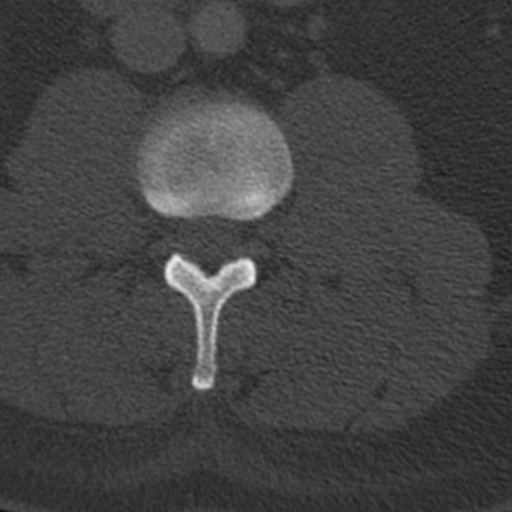
[im 119/141  bone]
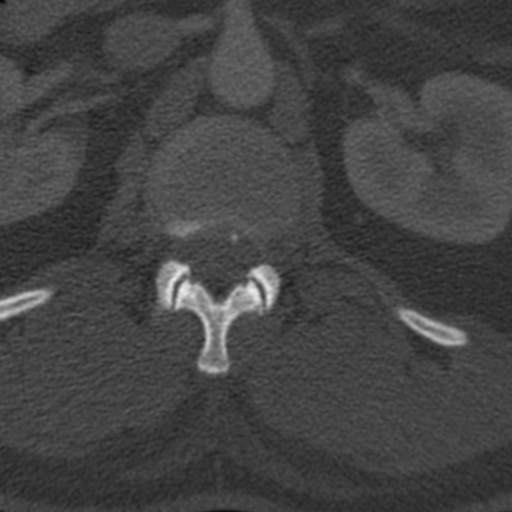

[Series 5: spine 2.0 sagittal bone · sagittal · 0.32mm/px · 5 of 81 slices shown]
[im 14/81  bone]
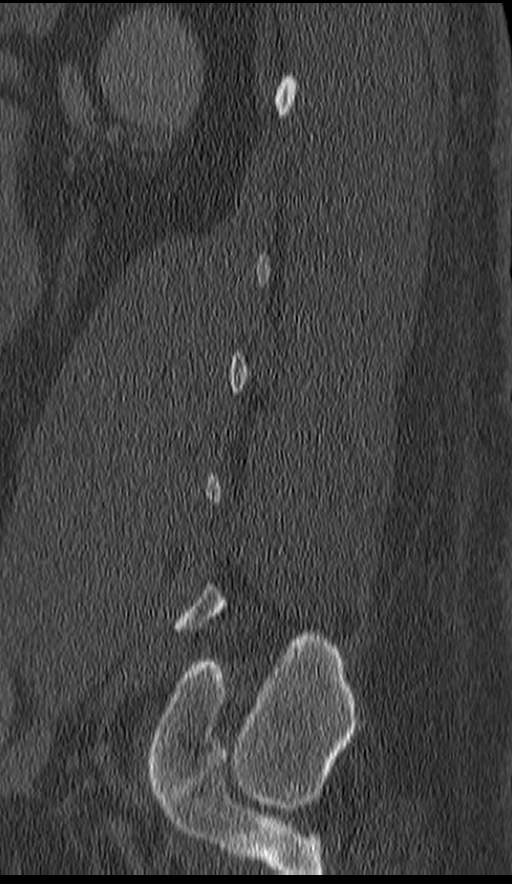
[im 27/81  bone]
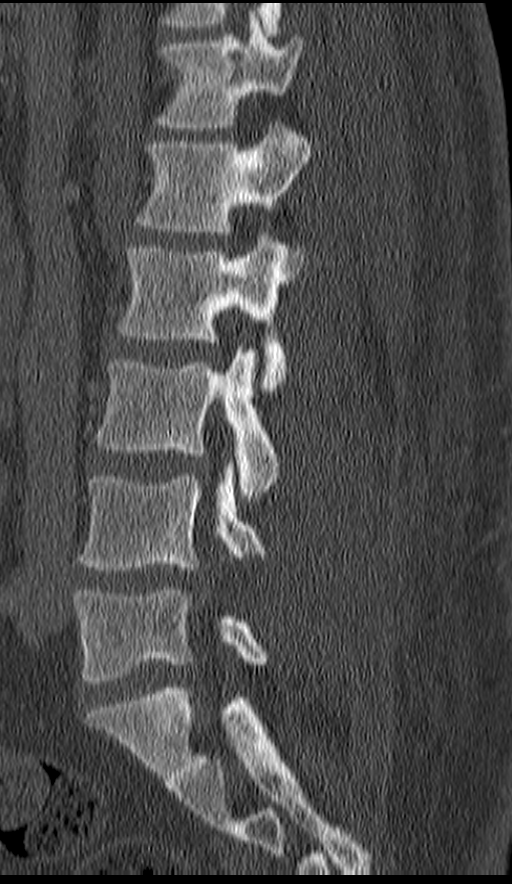
[im 41/81  bone]
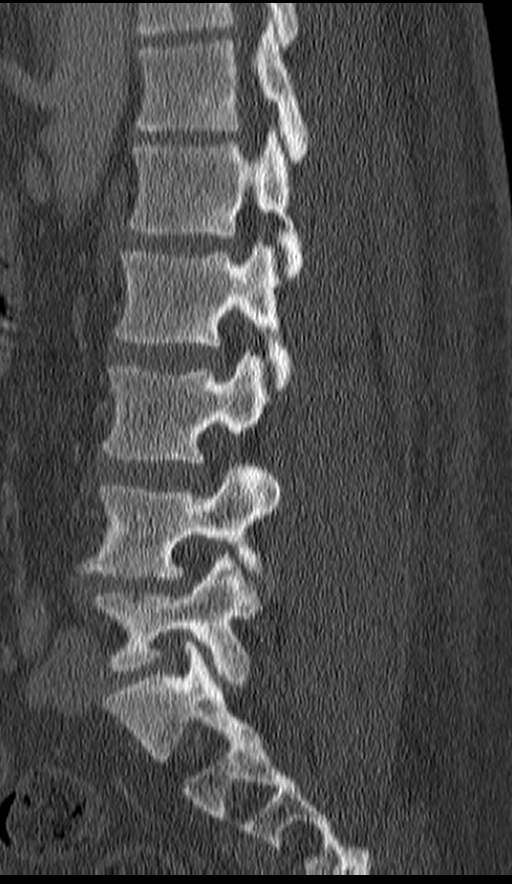
[im 54/81  bone]
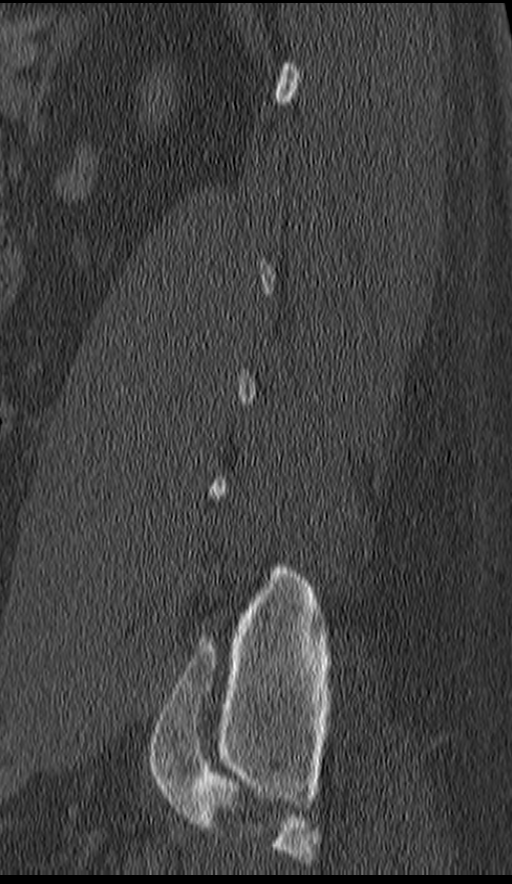
[im 67/81  bone]
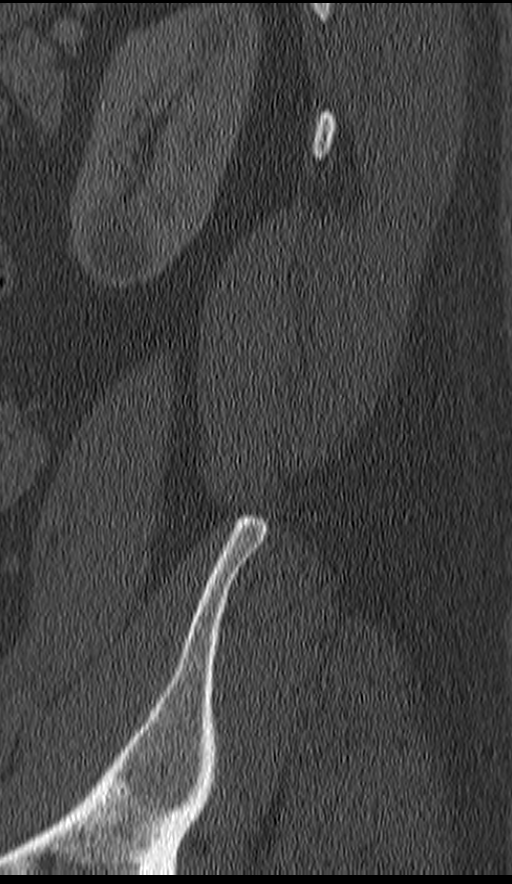

[Series 6: spine 2.0 coronal st · coronal · 0.33mm/px · 3 of 81 slices shown]
[im 17/81  bone]
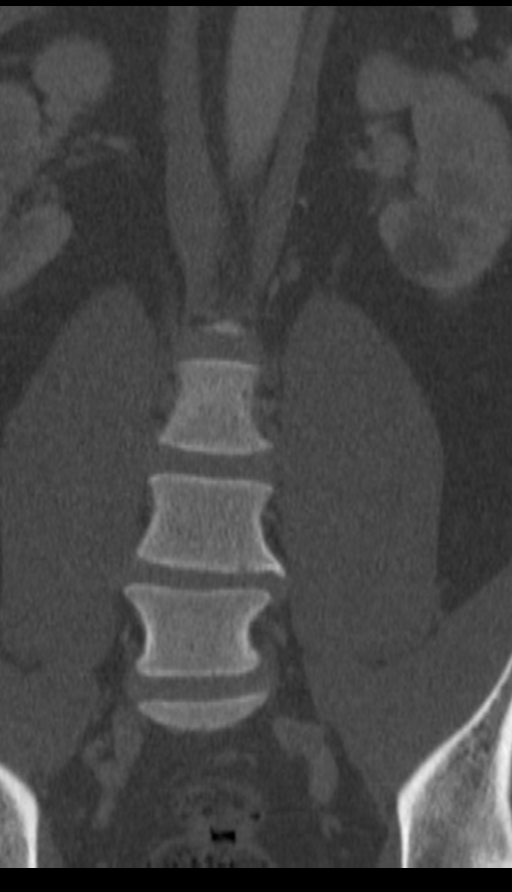
[im 33/81  bone]
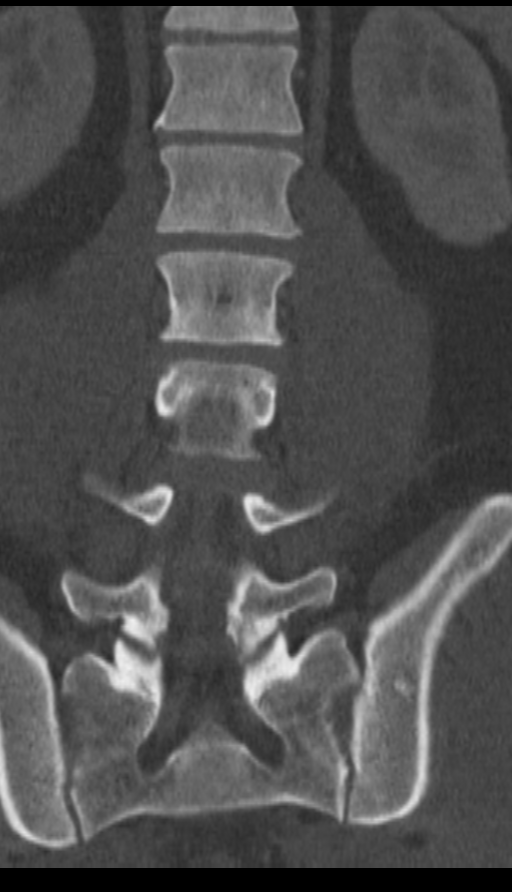
[im 49/81  bone]
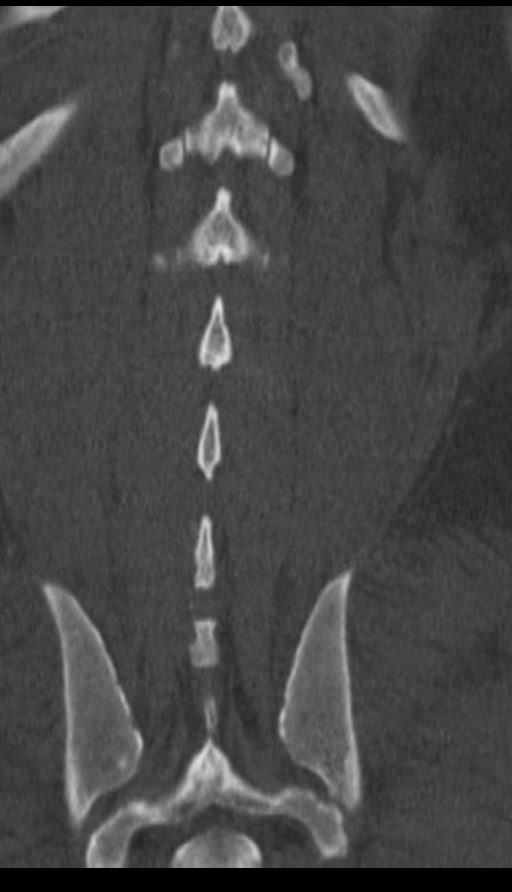

[12 of 33 positions shown; findings below may reference images not displayed]

FINDINGS: There has been a right L4-5 hemilaminectomy and
discectomy  on 01/01/2013.  There is no visible postoperative
complications such as large CSF leak or large subcutaneous
hematoma.  Mild stranding of the subcutaneous tissues with a small
amount of air is an expected postoperative finding.  The alignment
remains anatomic. No abnormal post contrast enhancement is evident.
No retroperitoneal lesions are seen.  There is chronic neural
foraminal narrowing at L5-S1 on the left related to bony
overgrowth.  There is no visible intraspinal hematoma within limits
of evaluation on this CT examination.  If there is strong concern
for a neural compressive lesion, MRI is more sensitive.
IMPRESSION: Anatomic alignment status post L4-L5 discectomy.  No visible
subcutaneous hematoma or cerebrospinal fluid leak.  No gross
intraspinal abnormality although if there is concern for
significant neural compression, MRI is more sensitive.

## 2014-01-27 ENCOUNTER — Emergency Department (HOSPITAL_BASED_OUTPATIENT_CLINIC_OR_DEPARTMENT_OTHER)
Admission: EM | Admit: 2014-01-27 | Discharge: 2014-01-27 | Disposition: A | Discharge status: Home / Self Care | Payer: Medicaid Other | Attending: Emergency Medicine | Admitting: Emergency Medicine

## 2014-01-27 ENCOUNTER — Encounter (HOSPITAL_BASED_OUTPATIENT_CLINIC_OR_DEPARTMENT_OTHER): Payer: Self-pay | Admitting: Emergency Medicine

## 2014-01-27 DIAGNOSIS — Z792 Long term (current) use of antibiotics: Secondary | ICD-10-CM | POA: Insufficient documentation

## 2014-01-27 DIAGNOSIS — M7711 Lateral epicondylitis, right elbow: Secondary | ICD-10-CM

## 2014-01-27 DIAGNOSIS — IMO0002 Reserved for concepts with insufficient information to code with codable children: Secondary | ICD-10-CM | POA: Insufficient documentation

## 2014-01-27 DIAGNOSIS — Z79899 Other long term (current) drug therapy: Secondary | ICD-10-CM | POA: Insufficient documentation

## 2014-01-27 DIAGNOSIS — I10 Essential (primary) hypertension: Secondary | ICD-10-CM | POA: Insufficient documentation

## 2014-01-27 DIAGNOSIS — M7710 Lateral epicondylitis, unspecified elbow: Secondary | ICD-10-CM | POA: Insufficient documentation

## 2014-01-27 DIAGNOSIS — K219 Gastro-esophageal reflux disease without esophagitis: Secondary | ICD-10-CM | POA: Insufficient documentation

## 2014-01-27 DIAGNOSIS — M109 Gout, unspecified: Secondary | ICD-10-CM | POA: Insufficient documentation

## 2014-01-27 DIAGNOSIS — J189 Pneumonia, unspecified organism: Secondary | ICD-10-CM | POA: Insufficient documentation

## 2014-01-27 MED ORDER — IBUPROFEN 800 MG PO TABS: 800.0000 mg | ORAL_TABLET | Freq: Three times a day (TID) | ORAL | Status: DC

## 2014-01-27 MED ORDER — CYCLOBENZAPRINE HCL 10 MG PO TABS: 10.0000 mg | ORAL_TABLET | Freq: Two times a day (BID) | ORAL | Status: DC | PRN

## 2014-01-27 MED ORDER — OXYCODONE-ACETAMINOPHEN 5-325 MG PO TABS: 1.0000 | ORAL_TABLET | ORAL | Status: DC | PRN

## 2014-01-27 NOTE — Discharge Instructions (Signed)
Tennis Elbow Your caregiver has diagnosed you with a condition often referred to as "tennis elbow." This results from small tears or soreness (inflammation) at the start (origin) of the extensor muscles of the forearm. Although the condition is often called tennis or golfer's elbow, it is caused by any repetitive action performed by your elbow. HOME CARE INSTRUCTIONS  If the condition has been short lived, rest may be the only treatment required. Using your opposite hand or arm to perform the task may help. Even changing your grip may help rest the extremity. These may even prevent the condition from recurring.  Longer standing problems, however, will often be relieved faster by:  Using anti-inflammatory agents.  Applying ice packs for 30 minutes at the end of the working day, at bed time, or when activities are finished.  Your caregiver may also have you wear a splint or sling. This will allow the inflamed tendon to heal. At times, steroid injections aided with a local anesthetic will be required along with splinting for 1 to 2 weeks. Two to three steroid injections will often solve the problem. In some long standing cases, the inflamed tendon does not respond to conservative (non-surgical) therapy. Then surgery may be required to repair it. MAKE SURE YOU:   Understand these instructions.  Will watch your condition.  Will get help right away if you are not doing well or get worse. Document Released: 07/24/2005 Document Revised: 10/16/2011 Document Reviewed: 03/11/2008 [employer] Patient Information 2015 [employer]. This information is not intended to replace advice given to you by your health care provider. Make sure you discuss any questions you have with your health care provider.  

## 2014-01-27 NOTE — ED Notes (Signed)
Pt c/o right posterior arm pain worse with movement. "may have injured it trying to get up". CMS intact, skin intact.

## 2014-01-27 NOTE — ED Provider Notes (Signed)
CSN: [id number]     Arrival date & time 01/27/14  1132 History   First MD Initiated Contact with Patient 01/27/14 1212     Chief Complaint  Patient presents with  . Arm Pain     (Consider location/radiation/quality/duration/timing/severity/associated sxs/prior Treatment) Patient is a 58 y.o. male presenting with arm pain. The history is provided by the patient. No language interpreter was used.  Arm Pain This is a new problem. The current episode started in the past 7 days. The problem occurs constantly. Pertinent negatives include no chills or fever. Associated symptoms comments: Pain in the right elbow for several days without known direct trauma. Worse with movement of the elbow or wrist. No redness, significant swelling or fever. .    Past Medical History  Diagnosis Date  . Gout   . Lumbar hernia   . Hypertension   . Pneumonia 07/31/12  . GERD (gastroesophageal reflux disease)    Past Surgical History  Procedure Laterality Date  . Fertility surgery      stent  . Lumbar laminectomy/decompression microdiscectomy Right 01/01/2013    Procedure: LUMBAR LAMINECTOMY/DECOMPRESSION MICRODISCECTOMY 1 LEVEL;  Surgeon: Mariam DollarGary P Cram, MD;  Location: [facility];  Service: Neurosurgery;  Laterality: Right;  Lumbar Laminectomies Lumbar Four-Five, Decompression, Discectomy   . Back surgery     No family history on file. History  Substance Use Topics  . Smoking status: Never Smoker   . Smokeless tobacco: Never Used  . Alcohol Use: No    Review of Systems  Constitutional: Negative for fever and chills.  Musculoskeletal: Negative.        See HPI  Skin: Negative.   Neurological: Negative.       Allergies  Review of patient's allergies indicates no known allergies.  Home Medications   Prior to Admission medications   Medication Sig Start Date End Date Taking? Authorizing Provider  amoxicillin (AMOXIL) 500 MG capsule Take 500 mg by mouth 2 (two) times daily. On 10 day course for  dental.    Historical Provider, MD  colchicine 0.6 MG tablet Take 0.6 mg by mouth 2 (two) times a week.    Historical Provider, MD  cyclobenzaprine (FLEXERIL) 10 MG tablet Take 1 tablet (10 mg total) by mouth 2 (two) times daily as needed for muscle spasms. 09/02/12   Dagmar HaitWilliam Blair Walden, MD  cyclobenzaprine (FLEXERIL) 10 MG tablet Take 1 tablet (10 mg total) by mouth 3 (three) times daily as needed for muscle spasms. 01/03/13   Mariam DollarGary P Cram, MD  HYDROcodone-acetaminophen (NORCO/VICODIN) 5-325 MG per tablet Take 1 tablet by mouth every 6 (six) hours as needed for pain.    Historical Provider, MD  lisinopril (PRINIVIL,ZESTRIL) 5 MG tablet Take 5 mg by mouth daily.    Historical Provider, MD  omeprazole (PRILOSEC) 20 MG capsule Take 20 mg by mouth 2 (two) times daily.    Historical Provider, MD  oxyCODONE-acetaminophen (PERCOCET/ROXICET) 5-325 MG per tablet Take 1-2 tablets by mouth every 4 (four) hours as needed. 01/03/13   Mariam DollarGary P Cram, MD  oxyCODONE-acetaminophen (PERCOCET/ROXICET) 5-325 MG per tablet Take 1 tablet by mouth every 4 (four) hours as needed for pain. 05/09/13   Hilario Quarryanielle S Ray, MD  predniSONE (DELTASONE) 10 MG tablet Take 2 tablets (20 mg total) by mouth daily. 05/09/13   Hilario Quarryanielle S Ray, MD   BP 164/95  Pulse 78  Temp(Src) 97.9 F (36.6 C) (Oral)  Resp 20  Ht 6\' 1"  (1.854 m)  Wt 262 lb (118.842 kg)  BMI 34.57 kg/m2  SpO2 98% Physical Exam  Constitutional: He is oriented to person, place, and time. He appears well-developed and well-nourished. No distress.  Neck: Normal range of motion.  Pulmonary/Chest: Effort normal.  Musculoskeletal: Normal range of motion.  Tenderness over proximal forearm without swelling or redness. No warmth No posterior elbow pain or swelling. FROM all joints of the arm with increased pain on wrist extension.   Neurological: He is alert and oriented to person, place, and time.  Skin: Skin is warm and dry.  Psychiatric: He has a normal mood and affect.     ED Course  Procedures (including critical care time) Labs Review Labs Reviewed - No data to display  Imaging Review No results found.   EKG Interpretation None      MDM   Final diagnoses:  None    1. Extensor tendonitis  No evidence of infection - no redness, warmth or induration. Tenderness and extensor symptoms c/w tendonitis.    Arnoldo HookerShari A Upstill, PA-C 01/27/14 1250

## 2014-01-27 NOTE — ED Provider Notes (Signed)
Medical screening examination/treatment/procedure(s) were performed by non-physician practitioner and as supervising physician I was immediately available for consultation/collaboration.   EKG Interpretation None        Melanie Belfi, MD 01/27/14 1434 

## 2014-03-01 ENCOUNTER — Encounter (HOSPITAL_BASED_OUTPATIENT_CLINIC_OR_DEPARTMENT_OTHER): Payer: Self-pay | Admitting: Emergency Medicine

## 2014-03-01 DIAGNOSIS — M109 Gout, unspecified: Secondary | ICD-10-CM | POA: Insufficient documentation

## 2014-03-01 DIAGNOSIS — I10 Essential (primary) hypertension: Secondary | ICD-10-CM | POA: Insufficient documentation

## 2014-03-01 DIAGNOSIS — Z79899 Other long term (current) drug therapy: Secondary | ICD-10-CM | POA: Insufficient documentation

## 2014-03-01 DIAGNOSIS — Z8701 Personal history of pneumonia (recurrent): Secondary | ICD-10-CM | POA: Insufficient documentation

## 2014-03-01 DIAGNOSIS — IMO0002 Reserved for concepts with insufficient information to code with codable children: Secondary | ICD-10-CM | POA: Insufficient documentation

## 2014-03-01 DIAGNOSIS — M5430 Sciatica, unspecified side: Secondary | ICD-10-CM | POA: Insufficient documentation

## 2014-03-01 DIAGNOSIS — K219 Gastro-esophageal reflux disease without esophagitis: Secondary | ICD-10-CM | POA: Insufficient documentation

## 2014-03-01 DIAGNOSIS — R0789 Other chest pain: Secondary | ICD-10-CM | POA: Insufficient documentation

## 2014-03-01 DIAGNOSIS — R079 Chest pain, unspecified: Secondary | ICD-10-CM | POA: Insufficient documentation

## 2014-03-02 ENCOUNTER — Emergency Department (HOSPITAL_BASED_OUTPATIENT_CLINIC_OR_DEPARTMENT_OTHER)
Admission: EM | Admit: 2014-03-02 | Discharge: 2014-03-02 | Disposition: A | Discharge status: Other Acute Inpatient Hospital | Payer: Medicaid Other | Attending: Emergency Medicine | Admitting: Emergency Medicine

## 2014-03-02 ENCOUNTER — Emergency Department (HOSPITAL_BASED_OUTPATIENT_CLINIC_OR_DEPARTMENT_OTHER): Payer: Medicaid Other

## 2014-03-02 DIAGNOSIS — M5431 Sciatica, right side: Secondary | ICD-10-CM

## 2014-03-02 DIAGNOSIS — R079 Chest pain, unspecified: Secondary | ICD-10-CM

## 2014-03-02 LAB — TROPONIN I: Troponin I: <0.3 ng/mL (ref <0.30)

## 2014-03-02 LAB — BASIC METABOLIC PANEL
Anion gap: 15 (ref 5–15)
BUN: 17 mg/dL (ref 6–23)
CO2: 23 meq/L (ref 19–32)
Calcium: 9.5 mg/dL (ref 8.4–10.5)
Chloride: 103 mEq/L (ref 96–112)
Creatinine, Ser: 1.6 mg/dL — ABNORMAL HIGH (ref 0.50–1.35)
GFR calc Af Amer: 54 mL/min — ABNORMAL LOW (ref >90)
GFR, EST NON AFRICAN AMERICAN: 46 mL/min — AB (ref >90)
GLUCOSE: 116 mg/dL — AB (ref 70–99)
POTASSIUM: 3.8 meq/L (ref 3.7–5.3)
Sodium: 141 mEq/L (ref 137–147)

## 2014-03-02 LAB — CBC
HCT: 47.6 % (ref 39.0–52.0)
HEMOGLOBIN: 16.3 g/dL (ref 13.0–17.0)
MCH: 26.9 pg (ref 26.0–34.0)
MCHC: 34.2 g/dL (ref 30.0–36.0)
MCV: 78.4 fL (ref 78.0–100.0)
Platelets: 144 10*3/uL — ABNORMAL LOW (ref 150–400)
RBC: 6.07 MIL/uL — AB (ref 4.22–5.81)
RDW: 13.5 % (ref 11.5–15.5)
WBC: 5 10*3/uL (ref 4.0–10.5)

## 2014-03-02 MED ORDER — NITROGLYCERIN 0.4 MG SL SUBL
0.4000 mg | SUBLINGUAL_TABLET | SUBLINGUAL | Status: DC | PRN
  Administered 2014-03-02: 0.4 mg via SUBLINGUAL
  Filled 2014-03-02: qty 1

## 2014-03-02 MED ORDER — ACETAMINOPHEN 325 MG PO TABS
650.0000 mg | ORAL_TABLET | Freq: Once | ORAL | Status: AC
  Administered 2014-03-02: 650 mg via ORAL
  Filled 2014-03-02: qty 2

## 2014-03-02 MED ORDER — ASPIRIN 81 MG PO CHEW
324.0000 mg | CHEWABLE_TABLET | Freq: Once | ORAL | Status: AC
  Administered 2014-03-02: 324 mg via ORAL
  Filled 2014-03-02: qty 4

## 2014-03-02 MED ORDER — ONDANSETRON HCL 4 MG/2ML IJ SOLN
4.0000 mg | Freq: Once | INTRAMUSCULAR | Status: AC
  Administered 2014-03-02: 4 mg via INTRAVENOUS
  Filled 2014-03-02: qty 2

## 2014-03-02 NOTE — ED Notes (Signed)
Pt. Resting quietly. Sinus on tele. Awaiting 2nd troponin results. VSS.

## 2014-03-02 NOTE — ED Notes (Signed)
Report given to [facility] 

## 2014-03-02 NOTE — ED Notes (Signed)
[facility] here to transport pt 

## 2014-03-02 NOTE — ED Provider Notes (Signed)
CSN: [id number]     Arrival date & time 03/01/14  2350 History   First MD Initiated Contact with Patient 03/02/14 0200     Chief Complaint  Patient presents with  . Chest Pain     (Consider location/radiation/quality/duration/timing/severity/associated sxs/prior Treatment) HPI This is a 58 year old male who states he began "feeling bad" yesterday evening about 6 PM. He states his left upper chest began hurting. He describes the pain vaguely but uses terms like "weight" and "tight". The pain was moderate at its worst and is improving and nearly gone presently. The pain was improved by lying on his right side and worsened with movement of his left arm. It was accompanied by nausea, shortness of breath and general malaise. He also developed epigastric discomfort eating dinner yesterday and had to quit eating. He did subsequently have a bowel movement. He is also complaining of a two-day history of right lower back pain radiating to his right buttock consistent with previous back pain. This pain is worse with movement of the right leg.  Past Medical History  Diagnosis Date  . Gout   . Lumbar hernia   . Hypertension   . Pneumonia 07/31/12  . GERD (gastroesophageal reflux disease)    Past Surgical History  Procedure Laterality Date  . Fertility surgery      stent  . Lumbar laminectomy/decompression microdiscectomy Right 01/01/2013    Procedure: LUMBAR LAMINECTOMY/DECOMPRESSION MICRODISCECTOMY 1 LEVEL;  Surgeon: Mariam DollarGary P Cram, MD;  Location: [facility];  Service: Neurosurgery;  Laterality: Right;  Lumbar Laminectomies Lumbar Four-Five, Decompression, Discectomy   . Back surgery     History reviewed. No pertinent family history. History  Substance Use Topics  . Smoking status: Never Smoker   . Smokeless tobacco: Never Used  . Alcohol Use: No    Review of Systems  All other systems reviewed and are negative.   Allergies  Review of patient's allergies indicates no known  allergies.  Home Medications   Prior to Admission medications   Medication Sig Start Date End Date Taking? Authorizing Provider  ibuprofen (ADVIL,MOTRIN) 800 MG tablet Take 1 tablet (800 mg total) by mouth 3 (three) times daily. 01/27/14  Yes Shari A Upstill, PA-C  lisinopril (PRINIVIL,ZESTRIL) 5 MG tablet Take 5 mg by mouth daily.   Yes Historical Provider, MD  omeprazole (PRILOSEC) 20 MG capsule Take 20 mg by mouth 2 (two) times daily.   Yes Historical Provider, MD  amoxicillin (AMOXIL) 500 MG capsule Take 500 mg by mouth 2 (two) times daily. On 10 day course for dental.    Historical Provider, MD  colchicine 0.6 MG tablet Take 0.6 mg by mouth 2 (two) times a week.    Historical Provider, MD  cyclobenzaprine (FLEXERIL) 10 MG tablet Take 1 tablet (10 mg total) by mouth 2 (two) times daily as needed for muscle spasms. 09/02/12   Dagmar HaitWilliam Blair Walden, MD  cyclobenzaprine (FLEXERIL) 10 MG tablet Take 1 tablet (10 mg total) by mouth 3 (three) times daily as needed for muscle spasms. 01/03/13   Mariam DollarGary P Cram, MD  cyclobenzaprine (FLEXERIL) 10 MG tablet Take 1 tablet (10 mg total) by mouth 2 (two) times daily as needed for muscle spasms. 01/27/14   Shari A Upstill, PA-C  HYDROcodone-acetaminophen (NORCO/VICODIN) 5-325 MG per tablet Take 1 tablet by mouth every 6 (six) hours as needed for pain.    Historical Provider, MD  oxyCODONE-acetaminophen (PERCOCET/ROXICET) 5-325 MG per tablet Take 1-2 tablets by mouth every 4 (four) hours as needed.  01/03/13   Mariam Dollar, MD  oxyCODONE-acetaminophen (PERCOCET/ROXICET) 5-325 MG per tablet Take 1 tablet by mouth every 4 (four) hours as needed for pain. 05/09/13   Hilario Quarry, MD  oxyCODONE-acetaminophen (PERCOCET/ROXICET) 5-325 MG per tablet Take 1-2 tablets by mouth every 4 (four) hours as needed for severe pain. 01/27/14   Shari A Upstill, PA-C  predniSONE (DELTASONE) 10 MG tablet Take 2 tablets (20 mg total) by mouth daily. 05/09/13   Hilario Quarry, MD   BP 126/72   Pulse 85  Resp 17  SpO2 96%  Physical Exam General: Well-developed, well-nourished male in no acute distress; appearance consistent with age of record HENT: normocephalic; atraumatic Eyes: pupils equal, round and reactive to light; extraocular muscles intact Neck: supple Heart: regular rate and rhythm; no murmur Lungs: clear to auscultation bilaterally Chest: Left upper chest wall tenderness which the patient states reproduces the pain of the chief complaint, pain also worse with passive range of motion of the left shoulder Abdomen: soft; nondistended; nontender; no masses or hepatosplenomegaly; bowel sounds present Back: Right lower back pain tenderness and pain on passive movement of right hip Extremities: No deformity; full range of motion except right hip limited due to pain; pulses normal Neurologic: Awake, alert and oriented; motor function intact in all extremities and symmetric; no facial droop Skin: Warm and dry Psychiatric: Flat affect    ED Course  Procedures (including critical care time)   MDM   Nursing notes and vitals signs, including pulse oximetry, reviewed.  Summary of this visit's results, reviewed by myself:   EKG Interpretation  Date/Time:  Sunday March 01 2014 23:58:07 EDT Ventricular Rate:  81 PR Interval:  188 QRS Duration: 76 QT Interval:  354 QTC Calculation: 411 R Axis:   8 Text Interpretation:  Normal sinus rhythm Possible Left atrial enlargement T wave abnormality, consider lateral ischemia Abnormal ECG No significant change was found Confirmed by Toyia Jelinek  MD, Jonny Ruiz ([id number]) on 03/02/2014 2:03:05 AM       Labs:  Results for orders placed during the hospital encounter of 03/02/14 (from the past 24 hour(s))  CBC     Status: Abnormal   Collection Time    03/02/14 12:20 AM      Result Value Ref Range   WBC 5.0  4.0 - 10.5 K/uL   RBC 6.07 (*) 4.22 - 5.81 MIL/uL   Hemoglobin 16.3  13.0 - 17.0 g/dL   HCT 60.4  54.0 - 98.1 %   MCV 78.4  78.0 -  100.0 fL   MCH 26.9  26.0 - 34.0 pg   MCHC 34.2  30.0 - 36.0 g/dL   RDW 19.1  47.8 - 29.5 %   Platelets 144 (*) 150 - 400 K/uL  BASIC METABOLIC PANEL     Status: Abnormal   Collection Time    03/02/14 12:20 AM      Result Value Ref Range   Sodium 141  137 - 147 mEq/L   Potassium 3.8  3.7 - 5.3 mEq/L   Chloride 103  96 - 112 mEq/L   CO2 23  19 - 32 mEq/L   Glucose, Bld 116 (*) 70 - 99 mg/dL   BUN 17  6 - 23 mg/dL   Creatinine, Ser 6.21 (*) 0.50 - 1.35 mg/dL   Calcium 9.5  8.4 - 30.8 mg/dL   GFR calc non Af Amer 46 (*) >90 mL/min   GFR calc Af Amer 54 (*) >90 mL/min   Anion gap  15  5 - 15  TROPONIN I     Status: None   Collection Time    03/02/14 12:20 AM      Result Value Ref Range   Troponin I <0.30  <0.30 ng/mL  TROPONIN I     Status: None   Collection Time    03/02/14  3:54 AM      Result Value Ref Range   Troponin I <0.30  <0.30 ng/mL    Imaging Studies: Dg Chest 2 View  03/02/2014   CLINICAL DATA:  Chest pain.  Leg pain.  Hip pain.  Nausea.  EXAM: CHEST  2 VIEW  COMPARISON:  01/01/2013  FINDINGS: Shallow inspiration. The heart size and mediastinal contours are within normal limits. Both lungs are clear. The visualized skeletal structures are unremarkable.  IMPRESSION: No active cardiopulmonary disease.   Electronically Signed   By: Burman Nieves M.D.   On: 03/02/2014 01:41   4:26 AM Pain-free after sublingual nitroglycerin. Chest no longer tender.  Dr. Linard Millers accepts for transfer to [facility].  Hanley Seamen, MD 03/02/14 [phone]

## 2014-03-02 NOTE — ED Notes (Signed)
Pt having right hip pain, pt pain radiates down his right leg. Pt states more pain in leg than chest, but his chest is hurting as well.

## 2014-03-02 NOTE — ED Notes (Signed)
Report given to Fulton MoleAlice, Charity fundraiserN at [facility]

## 2014-03-02 NOTE — ED Notes (Signed)
Pt left with [facility] at 0530

## 2014-03-24 ENCOUNTER — Encounter (HOSPITAL_BASED_OUTPATIENT_CLINIC_OR_DEPARTMENT_OTHER): Payer: Self-pay | Admitting: Emergency Medicine

## 2014-03-24 ENCOUNTER — Emergency Department (HOSPITAL_BASED_OUTPATIENT_CLINIC_OR_DEPARTMENT_OTHER)
Admission: EM | Admit: 2014-03-24 | Discharge: 2014-03-24 | Discharge status: Against Medical Advice | Payer: Medicaid Other | Attending: Emergency Medicine | Admitting: Emergency Medicine

## 2014-03-24 DIAGNOSIS — M549 Dorsalgia, unspecified: Secondary | ICD-10-CM | POA: Insufficient documentation

## 2014-03-24 DIAGNOSIS — M542 Cervicalgia: Secondary | ICD-10-CM | POA: Insufficient documentation

## 2014-03-24 DIAGNOSIS — I10 Essential (primary) hypertension: Secondary | ICD-10-CM | POA: Insufficient documentation

## 2014-03-24 NOTE — ED Notes (Signed)
Pt to room 1 in w/c, able to walk from w/c to bed. Pt reports 2 weeks of back and neck pain, denies any injury or trauma.

## 2014-03-24 NOTE — ED Notes (Signed)
Pt insists that he has to leave. Pt strongly encouraged to stay and await md eval. Pt states he has apt with his pcp on the 20th for pain meds, and that he has to be somewhere and cannot wait. Pt signs out ama, informed of all the risks of signing out ama and the benefits of staying for md exam. Pt verbalizes understanding, walked to check out desk with this rn.

## 2015-01-16 ENCOUNTER — Emergency Department (HOSPITAL_BASED_OUTPATIENT_CLINIC_OR_DEPARTMENT_OTHER)
Admission: EM | Admit: 2015-01-16 | Discharge: 2015-01-16 | Disposition: A | Discharge status: Home / Self Care | Payer: Medicare Other | Attending: Emergency Medicine | Admitting: Emergency Medicine

## 2015-01-16 ENCOUNTER — Encounter (HOSPITAL_BASED_OUTPATIENT_CLINIC_OR_DEPARTMENT_OTHER): Payer: Self-pay

## 2015-01-16 DIAGNOSIS — L237 Allergic contact dermatitis due to plants, except food: Secondary | ICD-10-CM | POA: Diagnosis not present

## 2015-01-16 DIAGNOSIS — K219 Gastro-esophageal reflux disease without esophagitis: Secondary | ICD-10-CM | POA: Insufficient documentation

## 2015-01-16 DIAGNOSIS — M109 Gout, unspecified: Secondary | ICD-10-CM | POA: Diagnosis not present

## 2015-01-16 DIAGNOSIS — Z79899 Other long term (current) drug therapy: Secondary | ICD-10-CM | POA: Insufficient documentation

## 2015-01-16 DIAGNOSIS — Z8701 Personal history of pneumonia (recurrent): Secondary | ICD-10-CM | POA: Diagnosis not present

## 2015-01-16 DIAGNOSIS — L255 Unspecified contact dermatitis due to plants, except food: Secondary | ICD-10-CM

## 2015-01-16 DIAGNOSIS — I10 Essential (primary) hypertension: Secondary | ICD-10-CM | POA: Insufficient documentation

## 2015-01-16 DIAGNOSIS — Z791 Long term (current) use of non-steroidal anti-inflammatories (NSAID): Secondary | ICD-10-CM | POA: Insufficient documentation

## 2015-01-16 DIAGNOSIS — R21 Rash and other nonspecific skin eruption: Secondary | ICD-10-CM | POA: Diagnosis present

## 2015-01-16 MED ORDER — HYDROXYZINE HCL 25 MG PO TABS: 25.0000 mg | ORAL_TABLET | Freq: Four times a day (QID) | ORAL | Status: DC

## 2015-01-16 MED ORDER — PREDNISONE 20 MG PO TABS: 60.0000 mg | ORAL_TABLET | Freq: Every day | ORAL | Status: DC

## 2015-01-16 MED ORDER — DIPHENHYDRAMINE HCL 2 % EX GEL: CUTANEOUS | Status: DC

## 2015-01-16 NOTE — Discharge Instructions (Signed)
Please follow with your primary care doctor in the next 2 days for a check-up. They must obtain records for further management.   Do not hesitate to return to the Emergency Department for any new, worsening or concerning symptoms.    Poison [employer] ivy is a rash caused by touching the leaves of the poison ivy plant. The rash often shows up 48 hours later. You might just have bumps, redness, and itching. Sometimes, blisters appear and break open. Your eyes may get puffy (swollen). Poison ivy often heals in 2 to 3 weeks without treatment. HOME CARE  If you touch poison ivy:  Wash your skin with soap and water right away. Wash under your fingernails. Do not rub the skin very hard.  Wash any clothes you were wearing.  Avoid poison ivy in the future. Poison ivy has 3 leaves on a stem.  Use medicine to help with itching as told by your doctor. Do not drive when you take this medicine.  Keep open sores dry, clean, and covered with a bandage and medicated cream, if needed.  Ask your doctor about medicine for children. GET HELP RIGHT AWAY IF:  You have open sores.  Redness spreads beyond the area of the rash.  There is yellowish white fluid (pus) coming from the rash.  Pain gets worse.  You have a temperature by mouth above 102 F (38.9 C), not controlled by medicine. MAKE SURE YOU:  Understand these instructions.  Will watch your condition.  Will get help right away if you are not doing well or get worse. Document Released: 08/26/2010 Document Revised: 10/16/2011 Document Reviewed: 08/26/2010 [facility] Patient Information 2015 [place], [place]. This information is not intended to replace advice given to you by your health care provider. Make sure you discuss any questions you have with your health care provider.

## 2015-01-16 NOTE — ED Notes (Signed)
Pt reports rash to left arm, groin, abdomen x2 weeks - rash itches, has worsened since being diagnosed with poison oak/ivy and being placed on prednisone and protonix.

## 2015-01-16 NOTE — ED Provider Notes (Signed)
CSN: [id number]     Arrival date & time 01/16/15  1737 History   First MD Initiated Contact with Patient 01/16/15 1758     Chief Complaint  Patient presents with  . Rash     (Consider location/radiation/quality/duration/timing/severity/associated sxs/prior Treatment) HPI  Aaron Marquez is a 59 y.o. male complaining of severe pruritic rash to left arm onset approximately 10 days ago. Patient saw his primary care physician at the beginning of the week, he was given a prescription for prednisone taper, he's taken approximately 5 days worth of medication he notes that the rash to the arm has improved however it is spread to the left abdomen and left groin area. Patient denies fever, chills, nausea, vomiting. Patient has been trying topical hydrocortisone ointment and calamine with little relief.   Past Medical History  Diagnosis Date  . Gout   . Lumbar hernia   . Hypertension   . Pneumonia 07/31/12  . GERD (gastroesophageal reflux disease)    Past Surgical History  Procedure Laterality Date  . Fertility surgery      stent  . Lumbar laminectomy/decompression microdiscectomy Right 01/01/2013    Procedure: LUMBAR LAMINECTOMY/DECOMPRESSION MICRODISCECTOMY 1 LEVEL;  Surgeon: Mariam Dollar, MD;  Location: [facility];  Service: Neurosurgery;  Laterality: Right;  Lumbar Laminectomies Lumbar Four-Five, Decompression, Discectomy   . Back surgery     History reviewed. No pertinent family history. History  Substance Use Topics  . Smoking status: Never Smoker   . Smokeless tobacco: Never Used  . Alcohol Use: No    Review of Systems  10 systems reviewed and found to be negative, except as noted in the HPI.   Allergies  Lisinopril  Home Medications   Prior to Admission medications   Medication Sig Start Date End Date Taking? Authorizing Provider  colchicine 0.6 MG tablet Take 0.6 mg by mouth 2 (two) times a week.   Yes Historical Provider, MD  ibuprofen (ADVIL,MOTRIN) 800 MG tablet  Take 1 tablet (800 mg total) by mouth 3 (three) times daily. 01/27/14  Yes Shari Upstill, PA-C  metoprolol succinate (TOPROL-XL) 50 MG 24 hr tablet Take 50 mg by mouth 2 (two) times daily. Take with or immediately following a meal.   Yes Historical Provider, MD  Pantoprazole Sodium (PROTONIX PO) Take by mouth.   Yes Historical Provider, MD  amoxicillin (AMOXIL) 500 MG capsule Take 500 mg by mouth 2 (two) times daily. On 10 day course for dental.    Historical Provider, MD  cyclobenzaprine (FLEXERIL) 10 MG tablet Take 1 tablet (10 mg total) by mouth 2 (two) times daily as needed for muscle spasms. 09/02/12   Elwin Mocha, MD  cyclobenzaprine (FLEXERIL) 10 MG tablet Take 1 tablet (10 mg total) by mouth 3 (three) times daily as needed for muscle spasms. 01/03/13   Donalee Citrin, MD  cyclobenzaprine (FLEXERIL) 10 MG tablet Take 1 tablet (10 mg total) by mouth 2 (two) times daily as needed for muscle spasms. 01/27/14   Elpidio Anis, PA-C  DIPHENHYDRAMINE HCL, TOPICAL, 2 % GEL Apply QID PRN 01/16/15   Joni Reining Amaurie Schreckengost, PA-C  HYDROcodone-acetaminophen (NORCO/VICODIN) 5-325 MG per tablet Take 1 tablet by mouth every 6 (six) hours as needed for pain.    Historical Provider, MD  hydrOXYzine (ATARAX/VISTARIL) 25 MG tablet Take 1 tablet (25 mg total) by mouth every 6 (six) hours. 01/16/15   Brendalee Matthies, PA-C  lisinopril (PRINIVIL,ZESTRIL) 5 MG tablet Take 5 mg by mouth daily.    Historical Provider, MD  omeprazole (PRILOSEC) 20 MG capsule Take 20 mg by mouth 2 (two) times daily.    Historical Provider, MD  predniSONE (DELTASONE) 20 MG tablet Take 3 tablets (60 mg total) by mouth daily. Take 60 mg by mouth daily week 1, then  by mouth daily for week 2, then  daily for week 3 01/16/15   Merril Isakson, PA-C   BP 129/90 mmHg  Pulse 90  Temp(Src) 98.6 F (37 C) (Oral)  Resp 16  Ht  (1.854 m)  Wt 286 lb (129.729 kg)  BMI 37.74 kg/m2  SpO2 95% Physical Exam  Constitutional: He is oriented to person,  place, and time. He appears well-developed and well-nourished. No distress.  HENT:  Head: Normocephalic and atraumatic.  Mouth/Throat: Oropharynx is clear and moist.  Eyes: Conjunctivae and EOM are normal. Pupils are equal, round, and reactive to light.  Neck: Normal range of motion.  Cardiovascular: Normal rate, regular rhythm and intact distal pulses.   Pulmonary/Chest: Effort normal and breath sounds normal.  Abdominal: Soft. There is no tenderness.  Musculoskeletal: Normal range of motion.  Neurological: He is alert and oriented to person, place, and time.  Skin: Rash noted. He is not diaphoretic.  Maculopapular erythematous rash to 4 left forearm, left arm abdomen and left groin. Lesions are blanchable, the spare the palms soles and mucous membranes. There is no warmth or tenderness to palpation  Psychiatric: He has a normal mood and affect.  Nursing note and vitals reviewed.   ED Course  Procedures (including critical care time) Labs Review Labs Reviewed - No data to display  Imaging Review No results found.   EKG Interpretation None      MDM   Final diagnoses:  Contact dermatitis due to poison vine    Filed Vitals:   01/16/15 1745  BP: 129/90  Pulse: 90  Temp: 98.6 F (37 C)  TempSrc: Oral  Resp: 16  Height:  (1.854 m)  Weight: 286 lb (129.729 kg)  SpO2: 95%     Aaron Marquez is a pleasant 59 y.o. male presenting with  Pruritic rash c/w was an IV dermatitis. Patient was started on a prednisone taper and although the rash to the forearm has improved it is now on the left abdomen, can this is likely secondary to the patient touching this area before the oils from the poison oak or ivy were sufficiently washed off of the hand. I will extend his oral prednisone, right him for hydroxyzine and also a topical diphenhydramine.  Evaluation does not show pathology that would require ongoing emergent intervention or inpatient treatment. Pt is hemodynamically  stable and mentating appropriately. Discussed findings and plan with patient/guardian, who agrees with care plan. All questions answered. Return precautions discussed and outpatient follow up given.   New Prescriptions   DIPHENHYDRAMINE HCL, TOPICAL, 2 % GEL    Apply QID PRN   HYDROXYZINE (ATARAX/VISTARIL) 25 MG TABLET    Take 1 tablet (25 mg total) by mouth every 6 (six) hours.   PREDNISONE (DELTASONE) 20 MG TABLET    Take 3 tablets (60 mg total) by mouth daily. Take 60 mg by mouth daily week 1, then  by mouth daily for week 2, then  daily for week 3         Wynetta Emery, PA-C 01/16/15 1836  Arby Barrette, MD 01/16/15 [phone]

## 2015-05-13 ENCOUNTER — Emergency Department (HOSPITAL_BASED_OUTPATIENT_CLINIC_OR_DEPARTMENT_OTHER)
Admission: EM | Admit: 2015-05-13 | Discharge: 2015-05-13 | Disposition: A | Discharge status: Home / Self Care | Payer: Medicare Other | Attending: Emergency Medicine | Admitting: Emergency Medicine

## 2015-05-13 DIAGNOSIS — K219 Gastro-esophageal reflux disease without esophagitis: Secondary | ICD-10-CM | POA: Insufficient documentation

## 2015-05-13 DIAGNOSIS — M654 Radial styloid tenosynovitis [de Quervain]: Secondary | ICD-10-CM | POA: Diagnosis not present

## 2015-05-13 DIAGNOSIS — Z8701 Personal history of pneumonia (recurrent): Secondary | ICD-10-CM | POA: Insufficient documentation

## 2015-05-13 DIAGNOSIS — M109 Gout, unspecified: Secondary | ICD-10-CM | POA: Diagnosis not present

## 2015-05-13 DIAGNOSIS — M25531 Pain in right wrist: Secondary | ICD-10-CM | POA: Diagnosis present

## 2015-05-13 DIAGNOSIS — I10 Essential (primary) hypertension: Secondary | ICD-10-CM | POA: Insufficient documentation

## 2015-05-13 DIAGNOSIS — Z79899 Other long term (current) drug therapy: Secondary | ICD-10-CM | POA: Diagnosis not present

## 2015-05-13 DIAGNOSIS — Z791 Long term (current) use of non-steroidal anti-inflammatories (NSAID): Secondary | ICD-10-CM | POA: Insufficient documentation

## 2015-05-13 MED ORDER — PREDNISONE 10 MG PO TABS: 20.0000 mg | ORAL_TABLET | Freq: Two times a day (BID) | ORAL | Status: DC

## 2015-05-13 NOTE — Discharge Instructions (Signed)
Wear splint as applied for the next week.  Prednisone as prescribed.  Follow up with your primary Dr. if not improving in the next week.   De Quervain Tenosynovitis Tendons attach muscles to bones. They also help with joint movements. When tendons become irritated or swollen, it is called tendinitis. The extensor pollicis brevis (EPB) tendon connects the EPB muscle to a bone that is near the base of the thumb. The EPB muscle helps to straighten and extend the thumb. De Quervain tenosynovitis is a condition in which the EPB tendon lining (sheath) becomes irritated, thickened, and swollen. This condition is sometimes called stenosing tenosynovitis. This condition causes pain on the thumb side of the back of the wrist. CAUSES Causes of this condition include:  Activities that repeatedly cause your thumb and wrist to extend.  A sudden increase in activity or change in activity that affects your wrist. RISK FACTORS: This condition is more likely to develop in:  Females.  People who have diabetes.  Women who have recently given birth.  People who are over 36 years of age.  People who do activities that involve repeated hand and wrist motions, such as tennis, racquetball, volleyball, gardening, and taking care of children.  People who do heavy labor.  People who have poor wrist strength and flexibility.  People who do not warm up properly before activities. SYMPTOMS Symptoms of this condition include:  Pain or tenderness over the thumb side of the back of the wrist when your thumb and wrist are not moving.  Pain that gets worse when you straighten your thumb or extend your thumb or wrist.  Pain when the injured area is touched.  Locking or catching of the thumb joint while you bend and straighten your thumb.  Decreased thumb motion due to pain.  Swelling over the affected area. DIAGNOSIS This condition is diagnosed with a medical history and physical exam. Your health care  provider will ask for details about your injury and ask about your symptoms. TREATMENT Treatment may include the use of icing and medicines to reduce pain and swelling. You may also be advised to wear a splint or brace to limit your thumb and wrist motion. In less severe cases, treatment may also include working with a physical therapist to strengthen your wrist and calm the irritation around your EPB tendon sheath. In severe cases, surgery may be needed. HOME CARE INSTRUCTIONS If You Have a Splint or Brace:  Wear it as told by your health care provider. Remove it only as told by your health care provider.  Loosen the splint or brace if your fingers become numb and tingle, or if they turn cold and blue.  Keep the splint or brace clean and dry. Managing Pain, Stiffness, and Swelling   If directed, apply ice to the injured area.  Put ice in a plastic bag.  Place a towel between your skin and the bag.  Leave the ice on for 20 minutes, 2-3 times per day.  Move your fingers often to avoid stiffness and to lessen swelling.  Raise (elevate) the injured area above the level of your heart while you are sitting or lying down. General Instructions  Return to your normal activities as told by your health care provider. Ask your health care provider what activities are safe for you.  Take over-the-counter and prescription medicines only as told by your health care provider.  Keep all follow-up visits as told by your health care provider. This is important.  Do  not drive or operate heavy machinery while taking prescription pain medicine. SEEK MEDICAL CARE IF:  Your pain, tenderness, or swelling gets worse, even if you have had treatment.  You have numbness or tingling in your wrist, hand, or fingers on the injured side.   This information is not intended to replace advice given to you by your health care provider. Make sure you discuss any questions you have with your health care provider.    Document Released: 07/24/2005 Document Revised: 04/14/2015 Document Reviewed: 09/29/2014 Elsevier Interactive Patient Education [employer]

## 2015-05-13 NOTE — ED Provider Notes (Signed)
CSN: [id number]     Arrival date & time 05/13/15  1019 History   First MD Initiated Contact with Patient 05/13/15 1052     No chief complaint on file.    (Consider location/radiation/quality/duration/timing/severity/associated sxs/prior Treatment) HPI Comments: Patient is a 59 year old male who presents with complaints of right wrist discomfort and difficulty grasping objects. He states that this started approximately one week ago and is worsens. He denies any specific injury or trauma. He denies any pain in his neck or shoulder. He denies any involvement of his leg or foot. His pain is worsened with movement of the wrist and palpation of the distal forearm.  The history is provided by the patient.    Past Medical History  Diagnosis Date  . Gout   . Lumbar hernia   . Hypertension   . Pneumonia 07/31/12  . GERD (gastroesophageal reflux disease)    Past Surgical History  Procedure Laterality Date  . Fertility surgery      stent  . Lumbar laminectomy/decompression microdiscectomy Right 01/01/2013    Procedure: LUMBAR LAMINECTOMY/DECOMPRESSION MICRODISCECTOMY 1 LEVEL;  Surgeon: Mariam Dollar, MD;  Location: [facility];  Service: Neurosurgery;  Laterality: Right;  Lumbar Laminectomies Lumbar Four-Five, Decompression, Discectomy   . Back surgery     No family history on file. Social History  Substance Use Topics  . Smoking status: Never Smoker   . Smokeless tobacco: Never Used  . Alcohol Use: No    Review of Systems  All other systems reviewed and are negative.     Allergies  Lisinopril  Home Medications   Prior to Admission medications   Medication Sig Start Date End Date Taking? Authorizing Provider  amoxicillin (AMOXIL) 500 MG capsule Take 500 mg by mouth 2 (two) times daily. On 10 day course for dental.    Historical Provider, MD  colchicine 0.6 MG tablet Take 0.6 mg by mouth 2 (two) times a week.    Historical Provider, MD  cyclobenzaprine (FLEXERIL) 10 MG tablet  Take 1 tablet (10 mg total) by mouth 2 (two) times daily as needed for muscle spasms. 09/02/12   Elwin Mocha, MD  cyclobenzaprine (FLEXERIL) 10 MG tablet Take 1 tablet (10 mg total) by mouth 3 (three) times daily as needed for muscle spasms. 01/03/13   Donalee Citrin, MD  cyclobenzaprine (FLEXERIL) 10 MG tablet Take 1 tablet (10 mg total) by mouth 2 (two) times daily as needed for muscle spasms. 01/27/14   Elpidio Anis, PA-C  DIPHENHYDRAMINE HCL, TOPICAL, 2 % GEL Apply QID PRN 01/16/15   Joni Reining Pisciotta, PA-C  HYDROcodone-acetaminophen (NORCO/VICODIN) 5-325 MG per tablet Take 1 tablet by mouth every 6 (six) hours as needed for pain.    Historical Provider, MD  hydrOXYzine (ATARAX/VISTARIL) 25 MG tablet Take 1 tablet (25 mg total) by mouth every 6 (six) hours. 01/16/15   Nicole Pisciotta, PA-C  ibuprofen (ADVIL,MOTRIN) 800 MG tablet Take 1 tablet (800 mg total) by mouth 3 (three) times daily. 01/27/14   Elpidio Anis, PA-C  lisinopril (PRINIVIL,ZESTRIL) 5 MG tablet Take 5 mg by mouth daily.    Historical Provider, MD  metoprolol succinate (TOPROL-XL) 50 MG 24 hr tablet Take 50 mg by mouth 2 (two) times daily. Take with or immediately following a meal.    Historical Provider, MD  omeprazole (PRILOSEC) 20 MG capsule Take 20 mg by mouth 2 (two) times daily.    Historical Provider, MD  Pantoprazole Sodium (PROTONIX PO) Take by mouth.    Historical Provider, MD  predniSONE (DELTASONE) 20 MG tablet Take 3 tablets (60 mg total) by mouth daily. Take 60 mg by mouth daily week 1, then  by mouth daily for week 2, then  daily for week 3 01/16/15   Joni Reining Pisciotta, PA-C   BP 167/92 mmHg  Pulse 82  Temp(Src) 98.4 F (36.9 C) (Oral)  Resp 20  Ht  (1.854 m)  Wt 283 lb (128.368 kg)  BMI 37.35 kg/m2  SpO2 98% Physical Exam  Constitutional: He is oriented to person, place, and time. He appears well-developed and well-nourished.  HENT:  Head: Normocephalic and atraumatic.  Neck: Normal range of motion.  Neck supple.  Musculoskeletal:  Patient has tenderness to palpation over the radial aspect of the distal forearm and wrist. He is able to flex and extend all fingers. He can oppose his thumb without difficulty.  Finkelstein's test is positive.  Neurological: He is alert and oriented to person, place, and time.  Skin: Skin is warm and dry.  Nursing note and vitals reviewed.   ED Course  Procedures (including critical care time) Labs Review Labs Reviewed - No data to display  Imaging Review No results found. I have personally reviewed and evaluated these images and lab results as part of my medical decision-making.   EKG Interpretation None      MDM   Final diagnoses:  None    This appears to be a de Quervain's tenosynovitis. He will be treated with immobilization, anti-inflammatory's, and when necessary follow-up with his primary doctor.    Geoffery Lyons, MD 05/13/15 [id number]

## 2015-06-12 ENCOUNTER — Emergency Department (HOSPITAL_BASED_OUTPATIENT_CLINIC_OR_DEPARTMENT_OTHER)
Admission: EM | Admit: 2015-06-12 | Discharge: 2015-06-12 | Disposition: A | Discharge status: Home / Self Care | Payer: Medicare Other | Attending: Physician Assistant | Admitting: Physician Assistant

## 2015-06-12 ENCOUNTER — Encounter (HOSPITAL_BASED_OUTPATIENT_CLINIC_OR_DEPARTMENT_OTHER): Payer: Self-pay | Admitting: Emergency Medicine

## 2015-06-12 ENCOUNTER — Emergency Department (HOSPITAL_BASED_OUTPATIENT_CLINIC_OR_DEPARTMENT_OTHER): Payer: Medicare Other

## 2015-06-12 DIAGNOSIS — R51 Headache: Secondary | ICD-10-CM | POA: Insufficient documentation

## 2015-06-12 DIAGNOSIS — I10 Essential (primary) hypertension: Secondary | ICD-10-CM | POA: Diagnosis not present

## 2015-06-12 DIAGNOSIS — R0602 Shortness of breath: Secondary | ICD-10-CM | POA: Insufficient documentation

## 2015-06-12 DIAGNOSIS — K219 Gastro-esophageal reflux disease without esophagitis: Secondary | ICD-10-CM | POA: Insufficient documentation

## 2015-06-12 DIAGNOSIS — R05 Cough: Secondary | ICD-10-CM | POA: Diagnosis not present

## 2015-06-12 DIAGNOSIS — Z79899 Other long term (current) drug therapy: Secondary | ICD-10-CM | POA: Diagnosis not present

## 2015-06-12 DIAGNOSIS — R0981 Nasal congestion: Secondary | ICD-10-CM | POA: Diagnosis not present

## 2015-06-12 DIAGNOSIS — R112 Nausea with vomiting, unspecified: Secondary | ICD-10-CM | POA: Insufficient documentation

## 2015-06-12 DIAGNOSIS — M545 Low back pain: Secondary | ICD-10-CM | POA: Diagnosis present

## 2015-06-12 DIAGNOSIS — M549 Dorsalgia, unspecified: Secondary | ICD-10-CM | POA: Insufficient documentation

## 2015-06-12 DIAGNOSIS — Z9889 Other specified postprocedural states: Secondary | ICD-10-CM | POA: Diagnosis not present

## 2015-06-12 DIAGNOSIS — R109 Unspecified abdominal pain: Secondary | ICD-10-CM | POA: Insufficient documentation

## 2015-06-12 DIAGNOSIS — Z8701 Personal history of pneumonia (recurrent): Secondary | ICD-10-CM | POA: Insufficient documentation

## 2015-06-12 DIAGNOSIS — M109 Gout, unspecified: Secondary | ICD-10-CM | POA: Insufficient documentation

## 2015-06-12 LAB — URINALYSIS, ROUTINE W REFLEX MICROSCOPIC
BILIRUBIN URINE: NEGATIVE
Glucose, UA: NEGATIVE mg/dL
Hgb urine dipstick: NEGATIVE
KETONES UR: NEGATIVE mg/dL
LEUKOCYTES UA: NEGATIVE
NITRITE: NEGATIVE
PROTEIN: NEGATIVE mg/dL
Specific Gravity, Urine: 1.027 (ref 1.005–1.030)
Urobilinogen, UA: 1 mg/dL (ref 0.0–1.0)
pH: 5 (ref 5.0–8.0)

## 2015-06-12 LAB — COMPREHENSIVE METABOLIC PANEL
ALBUMIN: 3.7 g/dL (ref 3.5–5.0)
ALT: 22 U/L (ref 17–63)
ANION GAP: 7 (ref 5–15)
AST: 24 U/L (ref 15–41)
Alkaline Phosphatase: 64 U/L (ref 38–126)
BUN: 16 mg/dL (ref 6–20)
CO2: 26 mmol/L (ref 22–32)
Calcium: 8.3 mg/dL — ABNORMAL LOW (ref 8.9–10.3)
Chloride: 106 mmol/L (ref 101–111)
Creatinine, Ser: 1.78 mg/dL — ABNORMAL HIGH (ref 0.61–1.24)
GFR calc Af Amer: 47 mL/min — ABNORMAL LOW (ref >60)
GFR calc non Af Amer: 40 mL/min — ABNORMAL LOW (ref >60)
Glucose, Bld: 96 mg/dL (ref 65–99)
POTASSIUM: 3.3 mmol/L — AB (ref 3.5–5.1)
SODIUM: 139 mmol/L (ref 135–145)
TOTAL PROTEIN: 6.7 g/dL (ref 6.5–8.1)
Total Bilirubin: 0.6 mg/dL (ref 0.3–1.2)

## 2015-06-12 LAB — CBC WITH DIFFERENTIAL/PLATELET
BASOS PCT: 0 %
Basophils Absolute: 0 10*3/uL (ref 0.0–0.1)
EOS ABS: 0.1 10*3/uL (ref 0.0–0.7)
Eosinophils Relative: 1 %
HCT: 44.9 % (ref 39.0–52.0)
Hemoglobin: 15.2 g/dL (ref 13.0–17.0)
Lymphocytes Relative: 25 %
Lymphs Abs: 1.8 10*3/uL (ref 0.7–4.0)
MCH: 26.8 pg (ref 26.0–34.0)
MCHC: 33.9 g/dL (ref 30.0–36.0)
MCV: 79 fL (ref 78.0–100.0)
Monocytes Absolute: 0.7 10*3/uL (ref 0.1–1.0)
Monocytes Relative: 10 %
NEUTROS ABS: 4.5 10*3/uL (ref 1.7–7.7)
NEUTROS PCT: 64 %
PLATELETS: 161 10*3/uL (ref 150–400)
RBC: 5.68 MIL/uL (ref 4.22–5.81)
RDW: 13.3 % (ref 11.5–15.5)
WBC: 7.1 10*3/uL (ref 4.0–10.5)

## 2015-06-12 MED ORDER — POTASSIUM CHLORIDE CRYS ER 20 MEQ PO TBCR
40.0000 meq | EXTENDED_RELEASE_TABLET | Freq: Once | ORAL | Status: AC
  Administered 2015-06-12: 40 meq via ORAL
  Filled 2015-06-12: qty 2

## 2015-06-12 MED ORDER — OXYCODONE-ACETAMINOPHEN 5-325 MG PO TABS
1.0000 | ORAL_TABLET | Freq: Once | ORAL | Status: AC
  Administered 2015-06-12: 1 via ORAL
  Filled 2015-06-12: qty 1

## 2015-06-12 MED ORDER — IBUPROFEN 800 MG PO TABS
800.0000 mg | ORAL_TABLET | Freq: Three times a day (TID) | ORAL | Status: DC
Start: 2015-06-12 — End: 2015-08-21

## 2015-06-12 MED ORDER — OXYCODONE-ACETAMINOPHEN 5-325 MG PO TABS: 1.0000 | ORAL_TABLET | Freq: Four times a day (QID) | ORAL | Status: DC | PRN

## 2015-06-12 NOTE — ED Provider Notes (Signed)
CSN: [id number]     Arrival date & time 06/12/15  1532 History  By signing my name below, I, Arianna Nassar, attest that this documentation has been prepared under the direction and in the presence of Gregorio Worley Randall An, MD. Electronically Signed: Octavia Heir, ED Scribe. 06/12/2015. 5:53 PM.    Chief Complaint  Patient presents with  . Shortness of Breath      The history is provided by the patient. No language interpreter was used.   HPI Comments: Aaron Marquez is a 59 y.o. male who presents to the Emergency Department complaining of intermittent, gradual worsening lower back pain onset today. Pt has an associated headache, nausea and vomiting onset this morning. Pt states he has also been coughing up phlegm. Pt reports having lower left sided flank pain that radiates to his LLQ. He endorses that moving his left leg increases the pain. Pt had back surgery one year ago and reports his pain in in the same area. He has taken tylenol to alleviate the pain with no relief. Pt denies hx of kidney stones, fever, and bladder/bowel incontinence.  Past Medical History  Diagnosis Date  . Gout   . Lumbar hernia   . Hypertension   . Pneumonia 07/31/12  . GERD (gastroesophageal reflux disease)    Past Surgical History  Procedure Laterality Date  . Fertility surgery      stent  . Lumbar laminectomy/decompression microdiscectomy Right 01/01/2013    Procedure: LUMBAR LAMINECTOMY/DECOMPRESSION MICRODISCECTOMY 1 LEVEL;  Surgeon: Mariam Dollar, MD;  Location: [facility];  Service: Neurosurgery;  Laterality: Right;  Lumbar Laminectomies Lumbar Four-Five, Decompression, Discectomy   . Back surgery     History reviewed. No pertinent family history. Social History  Substance Use Topics  . Smoking status: Never Smoker   . Smokeless tobacco: Never Used  . Alcohol Use: No    Review of Systems  Constitutional: Negative for fever.  HENT: Positive for congestion.   Gastrointestinal: Positive for  nausea and vomiting.  Neurological: Positive for headaches.  All other systems reviewed and are negative.     Allergies  Lisinopril  Home Medications   Prior to Admission medications   Medication Sig Start Date End Date Taking? Authorizing Provider  amoxicillin (AMOXIL) 500 MG capsule Take 500 mg by mouth 2 (two) times daily. On 10 day course for dental.    Historical Provider, MD  colchicine 0.6 MG tablet Take 0.6 mg by mouth 2 (two) times a week.    Historical Provider, MD  cyclobenzaprine (FLEXERIL) 10 MG tablet Take 1 tablet (10 mg total) by mouth 2 (two) times daily as needed for muscle spasms. 09/02/12   Elwin Mocha, MD  cyclobenzaprine (FLEXERIL) 10 MG tablet Take 1 tablet (10 mg total) by mouth 3 (three) times daily as needed for muscle spasms. 01/03/13   Donalee Citrin, MD  cyclobenzaprine (FLEXERIL) 10 MG tablet Take 1 tablet (10 mg total) by mouth 2 (two) times daily as needed for muscle spasms. 01/27/14   Elpidio Anis, PA-C  DIPHENHYDRAMINE HCL, TOPICAL, 2 % GEL Apply QID PRN 01/16/15   Joni Reining Pisciotta, PA-C  HYDROcodone-acetaminophen (NORCO/VICODIN) 5-325 MG per tablet Take 1 tablet by mouth every 6 (six) hours as needed for pain.    Historical Provider, MD  hydrOXYzine (ATARAX/VISTARIL) 25 MG tablet Take 1 tablet (25 mg total) by mouth every 6 (six) hours. 01/16/15   Nicole Pisciotta, PA-C  ibuprofen (ADVIL,MOTRIN) 800 MG tablet Take 1 tablet (800 mg total) by mouth 3 (three)  times daily. 01/27/14   Elpidio AnisShari Upstill, PA-C  ibuprofen (ADVIL,MOTRIN) 800 MG tablet Take 1 tablet (800 mg total) by mouth 3 (three) times daily. 06/12/15   Espn Zeman Lyn Hasan Douse, MD  lisinopril (PRINIVIL,ZESTRIL) 5 MG tablet Take 5 mg by mouth daily.    Historical Provider, MD  metoprolol succinate (TOPROL-XL) 50 MG 24 hr tablet Take 50 mg by mouth 2 (two) times daily. Take with or immediately following a meal.    Historical Provider, MD  omeprazole (PRILOSEC) 20 MG capsule Take 20 mg by mouth 2 (two) times daily.     Historical Provider, MD  oxyCODONE-acetaminophen (PERCOCET/ROXICET) 5-325 MG tablet Take 1 tablet by mouth every 6 (six) hours as needed for severe pain. 06/12/15   Jeweldean Drohan Lyn Winnifred Dufford, MD  Pantoprazole Sodium (PROTONIX PO) Take by mouth.    Historical Provider, MD  predniSONE (DELTASONE) 10 MG tablet Take 2 tablets (20 mg total) by mouth 2 (two) times daily. 05/13/15   Geoffery Lyonsouglas Delo, MD   Triage vitals: BP 168/101 mmHg  Pulse 80  Temp(Src) 98.5 F (36.9 C) (Oral)  Resp 16  Ht 6\' 1"  (1.854 m)  Wt 283 lb (128.368 kg)  BMI 37.35 kg/m2  SpO2 82% Physical Exam  Constitutional: He is oriented to person, place, and time. He appears well-developed and well-nourished.  HENT:  Head: Normocephalic and atraumatic.  Posterior pharynx has no erythema  Eyes: EOM are normal.  Neck: Normal range of motion.  Cardiovascular: Normal rate, regular rhythm, normal heart sounds and intact distal pulses.   Pulmonary/Chest: Effort normal and breath sounds normal. No respiratory distress.  Abdominal: Soft. He exhibits no distension. There is no tenderness.  Musculoskeletal: Normal range of motion.  Good strength, good ROM bilaterally  Neurological: He is alert and oriented to person, place, and time.  Skin: Skin is warm and dry.  Psychiatric: He has a normal mood and affect. Judgment normal.  Nursing note and vitals reviewed.   ED Course  Procedures  DIAGNOSTIC STUDIES: Oxygen Saturation is 82% on RA, low by my interpretation.  COORDINATION OF CARE:  5:51 PM Discussed treatment plan with pt at bedside and pt agreed to plan.  Labs Review Labs Reviewed  COMPREHENSIVE METABOLIC PANEL - Abnormal; Notable for the following:    Potassium 3.3 (*)    Creatinine, Ser 1.78 (*)    Calcium 8.3 (*)    GFR calc non Af Amer 40 (*)    GFR calc Af Amer 47 (*)    All other components within normal limits  URINALYSIS, ROUTINE W REFLEX MICROSCOPIC ([facility])  CBC WITH DIFFERENTIAL/PLATELET    Imaging  Review Dg Chest 2 View  06/12/2015  CLINICAL DATA:  Shortness of breath.  Productive cough. EXAM: CHEST  2 VIEW COMPARISON:  03/02/2014 chest radiograph FINDINGS: Slightly low lung volumes on the frontal view. Stable cardiomediastinal silhouette with normal heart size. No pneumothorax. No pleural effusion. Clear lungs, with no focal lung consolidation and no pulmonary edema. IMPRESSION: No active cardiopulmonary disease. Electronically Signed   By: Delbert PhenixJason A Poff M.D.   On: 06/12/2015 16:35   I have personally reviewed and evaluated these images and lab results as part of my medical decision-making.   EKG Interpretation None      MDM   Final diagnoses:  Back pain, unspecified location    Patient is a 59 year old male presenting with left back pain. He states it feels similar Today when he had back pain exacerbations in the past. We'll make sure the  patient does not have a kidney stone and labs are within normal. NO acute trauma. Plan to treat symptomatically and have patient follow up with primary care physician.   I personally performed the services described in this documentation, which was scribed in my presence. The recorded information has been reviewed and is accurate.    Shonique Pelphrey Randall An, MD 06/13/15 2222

## 2015-06-12 NOTE — Discharge Instructions (Signed)
Please follow up with her primary care physician. Please return with any trouble urinating, fevers or increasing back pain.   Back Exercises The following exercises strengthen the muscles that help to support the back. They also help to keep the lower back flexible. Doing these exercises can help to prevent back pain or lessen existing pain. If you have back pain or discomfort, try doing these exercises 2-3 times each day or as told by your health care provider. When the pain goes away, do them once each day, but increase the number of times that you repeat the steps for each exercise (do more repetitions). If you do not have back pain or discomfort, do these exercises once each day or as told by your health care provider. EXERCISES Single Knee to Chest Repeat these steps 3-5 times for each leg:  Lie on your back on a firm bed or the floor with your legs extended.  Bring one knee to your chest. Your other leg should stay extended and in contact with the floor.  Hold your knee in place by grabbing your knee or thigh.  Pull on your knee until you feel a gentle stretch in your lower back.  Hold the stretch for 10-30 seconds.  Slowly release and straighten your leg. Pelvic Tilt Repeat these steps 5-10 times:  Lie on your back on a firm bed or the floor with your legs extended.  Bend your knees so they are pointing toward the ceiling and your feet are flat on the floor.  Tighten your lower abdominal muscles to press your lower back against the floor. This motion will tilt your pelvis so your tailbone points up toward the ceiling instead of pointing to your feet or the floor.  With gentle tension and even breathing, hold this position for 5-10 seconds. Cat-Cow Repeat these steps until your lower back becomes more flexible:  Get into a hands-and-knees position on a firm surface. Keep your hands under your shoulders, and keep your knees under your hips. You may place padding under your knees  for comfort.  Let your head hang down, and point your tailbone toward the floor so your lower back becomes rounded like the back of a cat.  Hold this position for 5 seconds.  Slowly lift your head and point your tailbone up toward the ceiling so your back forms a sagging arch like the back of a cow.  Hold this position for 5 seconds. Press-Ups Repeat these steps 5-10 times:  Lie on your abdomen (face-down) on the floor.  Place your palms near your head, about shoulder-width apart.  While you keep your back as relaxed as possible and keep your hips on the floor, slowly straighten your arms to raise the top half of your body and lift your shoulders. Do not use your back muscles to raise your upper torso. You may adjust the placement of your hands to make yourself more comfortable.  Hold this position for 5 seconds while you keep your back relaxed.  Slowly return to lying flat on the floor. Bridges Repeat these steps 10 times: 1. Lie on your back on a firm surface. 2. Bend your knees so they are pointing toward the ceiling and your feet are flat on the floor. 3. Tighten your buttocks muscles and lift your buttocks off of the floor until your waist is at almost the same height as your knees. You should feel the muscles working in your buttocks and the back of your thighs. If you  do not feel these muscles, slide your feet 1-2 inches farther away from your buttocks. 4. Hold this position for 3-5 seconds. 5. Slowly lower your hips to the starting position, and allow your buttocks muscles to relax completely. If this exercise is too easy, try doing it with your arms crossed over your chest. Abdominal Crunches Repeat these steps 5-10 times: 1. Lie on your back on a firm bed or the floor with your legs extended. 2. Bend your knees so they are pointing toward the ceiling and your feet are flat on the floor. 3. Cross your arms over your chest. 4. Tip your chin slightly toward your chest without  bending your neck. 5. Tighten your abdominal muscles and slowly raise your trunk (torso) high enough to lift your shoulder blades a tiny bit off of the floor. Avoid raising your torso higher than that, because it can put too much stress on your low back and it does not help to strengthen your abdominal muscles. 6. Slowly return to your starting position. Back Lifts Repeat these steps 5-10 times: 1. Lie on your abdomen (face-down) with your arms at your sides, and rest your forehead on the floor. 2. Tighten the muscles in your legs and your buttocks. 3. Slowly lift your chest off of the floor while you keep your hips pressed to the floor. Keep the back of your head in line with the curve in your back. Your eyes should be looking at the floor. 4. Hold this position for 3-5 seconds. 5. Slowly return to your starting position. SEEK MEDICAL CARE IF:  Your back pain or discomfort gets much worse when you do an exercise.  Your back pain or discomfort does not lessen within 2 hours after you exercise. If you have any of these problems, stop doing these exercises right away. Do not do them again unless your health care provider says that you can. SEEK IMMEDIATE MEDICAL CARE IF:  You develop sudden, severe back pain. If this happens, stop doing the exercises right away. Do not do them again unless your health care provider says that you can.   This information is not intended to replace advice given to you by your health care provider. Make sure you discuss any questions you have with your health care provider.   Document Released: 08/31/2004 Document Revised: 04/14/2015 Document Reviewed: 09/17/2014 Elsevier Interactive Patient Education 2016 Elsevier Inc.  Back Pain, Adult Back pain is very common in adults.The cause of back pain is rarely dangerous and the pain often gets better over time.The cause of your back pain may not be known. Some common causes of back pain include:  Strain of the  muscles or ligaments supporting the spine.  Wear and tear (degeneration) of the spinal disks.  Arthritis.  Direct injury to the back. For many people, back pain may return. Since back pain is rarely dangerous, most people can learn to manage this condition on their own. HOME CARE INSTRUCTIONS Watch your back pain for any changes. The following actions may help to lessen any discomfort you are feeling:  Remain active. It is stressful on your back to sit or stand in one place for long periods of time. Do not sit, drive, or stand in one place for more than 30 minutes at a time. Take short walks on even surfaces as soon as you are able.Try to increase the length of time you walk each day.  Exercise regularly as directed by your health care provider. Exercise helps your  back heal faster. It also helps avoid future injury by keeping your muscles strong and flexible.  Do not stay in bed.Resting more than 1-2 days can delay your recovery.  Pay attention to your body when you bend and lift. The most comfortable positions are those that put less stress on your recovering back. Always use proper lifting techniques, including:  Bending your knees.  Keeping the load close to your body.  Avoiding twisting.  Find a comfortable position to sleep. Use a firm mattress and lie on your side with your knees slightly bent. If you lie on your back, put a pillow under your knees.  Avoid feeling anxious or stressed.Stress increases muscle tension and can worsen back pain.It is important to recognize when you are anxious or stressed and learn ways to manage it, such as with exercise.  Take medicines only as directed by your health care provider. Over-the-counter medicines to reduce pain and inflammation are often the most helpful.Your health care provider may prescribe muscle relaxant drugs.These medicines help dull your pain so you can more quickly return to your normal activities and healthy  exercise.  Apply ice to the injured area:  Put ice in a plastic bag.  Place a towel between your skin and the bag.  Leave the ice on for 20 minutes, 2-3 times a day for the first 2-3 days. After that, ice and heat may be alternated to reduce pain and spasms.  Maintain a healthy weight. Excess weight puts extra stress on your back and makes it difficult to maintain good posture. SEEK MEDICAL CARE IF:  You have pain that is not relieved with rest or medicine.  You have increasing pain going down into the legs or buttocks.  You have pain that does not improve in one week.  You have night pain.  You lose weight.  You have a fever or chills. SEEK IMMEDIATE MEDICAL CARE IF:   You develop new bowel or bladder control problems.  You have unusual weakness or numbness in your arms or legs.  You develop nausea or vomiting.  You develop abdominal pain.  You feel faint.   This information is not intended to replace advice given to you by your health care provider. Make sure you discuss any questions you have with your health care provider.   Document Released: 07/24/2005 Document Revised: 08/14/2014 Document Reviewed: 11/25/2013 Elsevier Interactive Patient Education [employer] Elsevier Inc.

## 2015-06-12 NOTE — ED Notes (Signed)
Presents today with having N/V with onset this am, now c/o left sided pain which radiates to back, pain increases with walking. Also c/o having HA

## 2015-06-12 NOTE — ED Notes (Signed)
Patient states that he is having SOB, and generalized aches and pains - abdominal pains x 2 -3 days

## 2015-08-21 ENCOUNTER — Emergency Department (HOSPITAL_BASED_OUTPATIENT_CLINIC_OR_DEPARTMENT_OTHER)
Admission: EM | Admit: 2015-08-21 | Discharge: 2015-08-21 | Disposition: A | Discharge status: Other Acute Inpatient Hospital | Payer: Medicare Other | Attending: Emergency Medicine | Admitting: Emergency Medicine

## 2015-08-21 ENCOUNTER — Encounter (HOSPITAL_BASED_OUTPATIENT_CLINIC_OR_DEPARTMENT_OTHER): Payer: Self-pay | Admitting: Emergency Medicine

## 2015-08-21 ENCOUNTER — Emergency Department (HOSPITAL_BASED_OUTPATIENT_CLINIC_OR_DEPARTMENT_OTHER): Payer: Medicare Other

## 2015-08-21 DIAGNOSIS — M25552 Pain in left hip: Secondary | ICD-10-CM | POA: Diagnosis not present

## 2015-08-21 DIAGNOSIS — Z8701 Personal history of pneumonia (recurrent): Secondary | ICD-10-CM | POA: Insufficient documentation

## 2015-08-21 DIAGNOSIS — I10 Essential (primary) hypertension: Secondary | ICD-10-CM | POA: Diagnosis not present

## 2015-08-21 DIAGNOSIS — M109 Gout, unspecified: Secondary | ICD-10-CM | POA: Insufficient documentation

## 2015-08-21 DIAGNOSIS — R0602 Shortness of breath: Secondary | ICD-10-CM | POA: Diagnosis not present

## 2015-08-21 DIAGNOSIS — M545 Low back pain: Secondary | ICD-10-CM | POA: Diagnosis not present

## 2015-08-21 DIAGNOSIS — R002 Palpitations: Secondary | ICD-10-CM | POA: Insufficient documentation

## 2015-08-21 DIAGNOSIS — R0789 Other chest pain: Secondary | ICD-10-CM | POA: Insufficient documentation

## 2015-08-21 DIAGNOSIS — R079 Chest pain, unspecified: Secondary | ICD-10-CM | POA: Diagnosis present

## 2015-08-21 DIAGNOSIS — Z79899 Other long term (current) drug therapy: Secondary | ICD-10-CM | POA: Diagnosis not present

## 2015-08-21 LAB — CBC WITH DIFFERENTIAL/PLATELET
Basophils Absolute: 0 10*3/uL (ref 0.0–0.1)
Basophils Relative: 0 %
Eosinophils Absolute: 0.1 10*3/uL (ref 0.0–0.7)
Eosinophils Relative: 1 %
HEMATOCRIT: 46.7 % (ref 39.0–52.0)
Hemoglobin: 15.5 g/dL (ref 13.0–17.0)
LYMPHS ABS: 1.7 10*3/uL (ref 0.7–4.0)
LYMPHS PCT: 31 %
MCH: 26.1 pg (ref 26.0–34.0)
MCHC: 33.2 g/dL (ref 30.0–36.0)
MCV: 78.6 fL (ref 78.0–100.0)
MONO ABS: 0.5 10*3/uL (ref 0.1–1.0)
MONOS PCT: 9 %
NEUTROS ABS: 3.1 10*3/uL (ref 1.7–7.7)
Neutrophils Relative %: 59 %
Platelets: 149 10*3/uL — ABNORMAL LOW (ref 150–400)
RBC: 5.94 MIL/uL — ABNORMAL HIGH (ref 4.22–5.81)
RDW: 13.3 % (ref 11.5–15.5)
WBC: 5.3 10*3/uL (ref 4.0–10.5)

## 2015-08-21 LAB — URINALYSIS, ROUTINE W REFLEX MICROSCOPIC
Bilirubin Urine: NEGATIVE
GLUCOSE, UA: NEGATIVE mg/dL
Hgb urine dipstick: NEGATIVE
KETONES UR: NEGATIVE mg/dL
LEUKOCYTES UA: NEGATIVE
Nitrite: NEGATIVE
PH: 5.5 (ref 5.0–8.0)
Protein, ur: NEGATIVE mg/dL
SPECIFIC GRAVITY, URINE: 1.023 (ref 1.005–1.030)

## 2015-08-21 LAB — BASIC METABOLIC PANEL
Anion gap: 7 (ref 5–15)
BUN: 18 mg/dL (ref 6–20)
CALCIUM: 8.9 mg/dL (ref 8.9–10.3)
CHLORIDE: 106 mmol/L (ref 101–111)
CO2: 26 mmol/L (ref 22–32)
CREATININE: 1.76 mg/dL — AB (ref 0.61–1.24)
GFR calc Af Amer: 47 mL/min — ABNORMAL LOW (ref >60)
GFR calc non Af Amer: 41 mL/min — ABNORMAL LOW (ref >60)
GLUCOSE: 96 mg/dL (ref 65–99)
Potassium: 4 mmol/L (ref 3.5–5.1)
Sodium: 139 mmol/L (ref 135–145)

## 2015-08-21 LAB — TROPONIN I: Troponin I: 0.04 ng/mL — ABNORMAL HIGH (ref <0.031)

## 2015-08-21 MED ORDER — MORPHINE SULFATE (PF) 4 MG/ML IV SOLN
4.0000 mg | Freq: Once | INTRAVENOUS | Status: AC
  Administered 2015-08-21: 4 mg via INTRAVENOUS
  Filled 2015-08-21: qty 1

## 2015-08-21 MED ORDER — ACETAMINOPHEN 325 MG PO TABS
650.0000 mg | ORAL_TABLET | Freq: Once | ORAL | Status: AC
  Administered 2015-08-21: 650 mg via ORAL
  Filled 2015-08-21: qty 2

## 2015-08-21 MED ORDER — ONDANSETRON HCL 4 MG/2ML IJ SOLN
4.0000 mg | Freq: Once | INTRAMUSCULAR | Status: AC
  Administered 2015-08-21: 4 mg via INTRAVENOUS
  Filled 2015-08-21: qty 2

## 2015-08-21 MED ORDER — ASPIRIN 81 MG PO CHEW
324.0000 mg | CHEWABLE_TABLET | Freq: Once | ORAL | Status: AC
  Administered 2015-08-21: 324 mg via ORAL
  Filled 2015-08-21: qty 4

## 2015-08-21 NOTE — ED Notes (Signed)
[place]. Pt awaiting [facility] at this time. PT stable.

## 2015-08-21 NOTE — ED Notes (Addendum)
Attempted to call [facility] , was placed on  hold for 10 minutes.

## 2015-08-21 NOTE — ED Notes (Signed)
Pt reports left sided hip pain x 4 days, states he attempted to walk to mailbox yesterday and developed chest pain and shortness of breath that resolved with rest. Pt states he only came today for evaluation because his wife made him

## 2015-08-21 NOTE — ED Notes (Signed)
Pt left premises via [facility].

## 2015-08-21 NOTE — ED Notes (Signed)
Pt's wife given hospital transfer information with phone number. Pt's wife # [phone].

## 2015-08-21 NOTE — ED Provider Notes (Signed)
CSN: [id number]     Arrival date & time 08/21/15  1611 History   First MD Initiated Contact with Patient 08/21/15 1808     Chief Complaint  Patient presents with  . Hip Pain  . Chest Pain     (Consider location/radiation/quality/duration/timing/severity/associated sxs/prior Treatment) HPI Comments: Patient with history of lumbar spine surgery on the right side in 2014 -- presents with complaint of left-sided hip pain/lower back pain and chest pain and shortness of breath. Patient has no cardiac history. Risk factors include high cholesterol and hypertension. No history of diabetes, smoking. Father had a heart attack in his late 77s. Patient states that he feels a fluttering sensation in his chest when he walks. This has been ongoing for several months. Patient noted yesterday after walking over to a neighbor's house, that he became very short of breath and had a pressure type pain in his mid chest that did not radiate. This was activity he would have been able to do without problem. It is not associated with vomiting. Patient rested and the symptoms resolved. He did not do anything strenuous the rest of the day. Patient told a family member today and decided to come to the hospital. This incident which occurred yesterday was frightening to the patient. No treatments prior to arrival including ASA.  Patient also complains of 4 days of pain, worse with movement in his lower back and left hip. When he stands up the pain shoots into his left leg. Feels the same as previous right-sided symptoms, however on the opposite side. The treatments prior to arrival. Patient denies warning symptoms of back pain including: fecal incontinence, urinary retention or overflow incontinence, night sweats, waking from sleep with back pain, unexplained fevers or weight loss, h/o cancer, IVDU, recent trauma.     Patient is a 60 y.o. male presenting with hip pain and chest pain. The history is provided by the patient and  medical records.  Hip Pain Associated symptoms include arthralgias and chest pain. Pertinent negatives include no abdominal pain, coughing, diaphoresis, fever, nausea, neck pain, numbness, rash, vomiting or weakness.  Chest Pain Associated symptoms: back pain, palpitations and shortness of breath   Associated symptoms: no abdominal pain, no cough, no diaphoresis, no fever, no nausea, no numbness, not vomiting and no weakness     Past Medical History  Diagnosis Date  . Gout   . Lumbar hernia   . Hypertension   . Pneumonia 07/31/12  . GERD (gastroesophageal reflux disease)    Past Surgical History  Procedure Laterality Date  . Fertility surgery      stent  . Lumbar laminectomy/decompression microdiscectomy Right 01/01/2013    Procedure: LUMBAR LAMINECTOMY/DECOMPRESSION MICRODISCECTOMY 1 LEVEL;  Surgeon: Mariam Dollar, MD;  Location: [facility];  Service: Neurosurgery;  Laterality: Right;  Lumbar Laminectomies Lumbar Four-Five, Decompression, Discectomy   . Back surgery     History reviewed. No pertinent family history. Social History  Substance Use Topics  . Smoking status: Never Smoker   . Smokeless tobacco: Never Used  . Alcohol Use: No    Review of Systems  Constitutional: Negative for fever, diaphoresis and unexpected weight change.  Eyes: Negative for redness.  Respiratory: Positive for shortness of breath. Negative for cough.   Cardiovascular: Positive for chest pain and palpitations. Negative for leg swelling.  Gastrointestinal: Negative for nausea, vomiting, abdominal pain and constipation.       Neg for fecal incontinence  Genitourinary: Negative for dysuria, hematuria, flank pain and difficulty  urinating.       Negative for urinary incontinence or retention  Musculoskeletal: Positive for back pain and arthralgias. Negative for neck pain.  Skin: Negative for rash.  Neurological: Negative for syncope, weakness, light-headedness and numbness.       Negative for saddle  paresthesias   Psychiatric/Behavioral: The patient is not nervous/anxious.     Allergies  Lisinopril  Home Medications   Prior to Admission medications   Medication Sig Start Date End Date Taking? Authorizing Provider  colchicine 0.6 MG tablet Take 0.6 mg by mouth 2 (two) times a week.   Yes Historical Provider, MD  metoprolol succinate (TOPROL-XL) 50 MG 24 hr tablet Take 50 mg by mouth 2 (two) times daily. Take with or immediately following a meal.   Yes Historical Provider, MD  omeprazole (PRILOSEC) 20 MG capsule Take 20 mg by mouth 2 (two) times daily.   Yes Historical Provider, MD   BP 159/91 mmHg  Pulse 77  Temp(Src) 98.3 F (36.8 C) (Oral)  Resp 14  Ht 6\' 1"  (1.854 m)  Wt 127.914 kg  BMI 37.21 kg/m2  SpO2 94%   Physical Exam  Constitutional: He appears well-developed and well-nourished.  HENT:  Head: Normocephalic and atraumatic.  Mouth/Throat: Mucous membranes are normal. Mucous membranes are not dry.  Eyes: Conjunctivae are normal.  Neck: Trachea normal and normal range of motion. Neck supple. Normal carotid pulses and no JVD present. No muscular tenderness present. Carotid bruit is not present. No tracheal deviation present.  Cardiovascular: Normal rate, regular rhythm, S1 normal, S2 normal, normal heart sounds, intact distal pulses and normal pulses.  Exam reveals no distant heart sounds.   No murmur heard. Pulmonary/Chest: Effort normal and breath sounds normal. No respiratory distress. He has no wheezes. He exhibits no tenderness.  Abdominal: Soft. Normal aorta and bowel sounds are normal. There is no tenderness. There is no rebound, no guarding and no CVA tenderness.  Musculoskeletal: He exhibits no edema.       Left hip: He exhibits decreased range of motion. He exhibits normal strength.       Cervical back: Normal.       Thoracic back: Normal.       Lumbar back: He exhibits tenderness.       Back:  No step-off noted with palpation of spine.   Neurological:  He is alert. He has normal reflexes. No sensory deficit. He exhibits normal muscle tone.  5/5 strength in entire lower extremities bilaterally. No sensation deficit.   Skin: Skin is warm and dry. He is not diaphoretic. No cyanosis. No pallor.  Psychiatric: He has a normal mood and affect.  Nursing note and vitals reviewed.   ED Course  Procedures (including critical care time) Labs Review Labs Reviewed  CBC WITH DIFFERENTIAL/PLATELET - Abnormal; Notable for the following:    RBC 5.94 (*)    Platelets 149 (*)    All other components within normal limits  BASIC METABOLIC PANEL - Abnormal; Notable for the following:    Creatinine, Ser 1.76 (*)    GFR calc non Af Amer 41 (*)    GFR calc Af Amer 47 (*)    All other components within normal limits  TROPONIN I - Abnormal; Notable for the following:    Troponin I 0.04 (*)    All other components within normal limits  URINALYSIS, ROUTINE W REFLEX MICROSCOPIC (NOT AT [facility])    Imaging Review Dg Chest 2 View  08/21/2015  CLINICAL DATA:  Chest  pain and shortness of breath for 1 day EXAM: CHEST  2 VIEW COMPARISON:  June 12, 2015 FINDINGS: There is no edema or consolidation. The heart size and pulmonary vascularity are normal. No adenopathy. No pneumothorax. No bone lesions. IMPRESSION: No edema or consolidation. Electronically Signed   By: Bretta BangWilliam  Woodruff III M.D.   On: 08/21/2015 18:52   I have personally reviewed and evaluated these images and lab results as part of my medical decision-making.   EKG Interpretation   Date/Time:  Saturday August 21 2015 17:04:54 EST Ventricular Rate:  77 PR Interval:  190 QRS Duration: 74 QT Interval:  392 QTC Calculation: 443 R Axis:   -8 Text Interpretation:  Normal sinus rhythm Possible Left atrial enlargement  T wave abnormality, consider lateral ischemia Abnormal ECG similar to  previous No significant change was found Confirmed by Manus GunningANCOUR  MD, STEPHEN  [phone]([id number]) on 08/21/2015 6:11:24 PM        6:43 PM Patient seen and examined. Work-up initiated. Medications ordered.   Vital signs reviewed and are as follows: BP 159/91 mmHg  Pulse 77  Temp(Src) 98.3 F (36.8 C) (Oral)  Resp 14  Ht 6\' 1"  (1.854 m)  Wt 127.914 kg  BMI 37.21 kg/m2  SpO2 94%  Dr. Manus Gunningancour has reviewed EKG with on-call STEMI MD Dr. Sharyn LullHarwani, who agrees patient does not meet criteria for STEMI. Pt also has no active pain.   8:07 PM [facility] with [facility] at [facility]. He accepts patient. Awaiting bed assignment.   Dr. Manus Gunningancour has seen patient. Agrees hip pain is reproducible and MSK in nature. Would not scan looking for dissection at this time.   8:23 PM Bed assigned. Rm 467.    MDM   Final diagnoses:  Other chest pain  Hip pain, left   Admit to [facility] for CP work-up.     Renne CriglerJoshua Dontrel Smethers, PA-C 08/21/15 2024  Glynn OctaveStephen Rancour, MD 08/22/15 0157

## 2015-08-21 NOTE — ED Notes (Signed)
Report called to Morrie SheldonAshley RN at [facility]. Questions answered.

## 2015-09-08 ENCOUNTER — Emergency Department (HOSPITAL_BASED_OUTPATIENT_CLINIC_OR_DEPARTMENT_OTHER)
Admission: EM | Admit: 2015-09-08 | Discharge: 2015-09-08 | Disposition: A | Discharge status: Home / Self Care | Payer: Medicare Other | Attending: Emergency Medicine | Admitting: Emergency Medicine

## 2015-09-08 ENCOUNTER — Encounter (HOSPITAL_BASED_OUTPATIENT_CLINIC_OR_DEPARTMENT_OTHER): Payer: Self-pay

## 2015-09-08 DIAGNOSIS — K219 Gastro-esophageal reflux disease without esophagitis: Secondary | ICD-10-CM | POA: Diagnosis not present

## 2015-09-08 DIAGNOSIS — Z8701 Personal history of pneumonia (recurrent): Secondary | ICD-10-CM | POA: Diagnosis not present

## 2015-09-08 DIAGNOSIS — Z7982 Long term (current) use of aspirin: Secondary | ICD-10-CM | POA: Insufficient documentation

## 2015-09-08 DIAGNOSIS — M109 Gout, unspecified: Secondary | ICD-10-CM

## 2015-09-08 DIAGNOSIS — I10 Essential (primary) hypertension: Secondary | ICD-10-CM | POA: Insufficient documentation

## 2015-09-08 DIAGNOSIS — Z79899 Other long term (current) drug therapy: Secondary | ICD-10-CM | POA: Insufficient documentation

## 2015-09-08 DIAGNOSIS — M10079 Idiopathic gout, unspecified ankle and foot: Secondary | ICD-10-CM | POA: Diagnosis not present

## 2015-09-08 HISTORY — DX: Dorsalgia, unspecified: M54.9

## 2015-09-08 MED ORDER — COLCHICINE 0.6 MG PO TABS
0.6000 mg | ORAL_TABLET | Freq: Once | ORAL | Status: AC
  Administered 2015-09-08: 0.6 mg via ORAL
  Filled 2015-09-08: qty 1

## 2015-09-08 MED ORDER — COLCHICINE 0.6 MG PO TABS: 0.6000 mg | ORAL_TABLET | Freq: Every day | ORAL | Status: DC

## 2015-09-08 MED FILL — COLCHICINE 0.6 MG TABLET: 0.6 | 3 days supply | Qty: 5 | Fill #0

## 2015-09-08 NOTE — ED Notes (Signed)
Pt reports Gout flare - has pain to right elbow and bilateral feet.

## 2015-09-08 NOTE — Discharge Instructions (Signed)
Please follow up with your primary care provider tomorrow for re-evaluation and further management  Gout Gout is an inflammatory arthritis caused by a buildup of uric acid crystals in the joints. Uric acid is a chemical that is normally present in the blood. When the level of uric acid in the blood is too high it can form crystals that deposit in your joints and tissues. This causes joint redness, soreness, and swelling (inflammation). Repeat attacks are common. Over time, uric acid crystals can form into masses (tophi) near a joint, destroying bone and causing disfigurement. Gout is treatable and often preventable. CAUSES  The disease begins with elevated levels of uric acid in the blood. Uric acid is produced by your body when it breaks down a naturally found substance called purines. Certain foods you eat, such as meats and fish, contain high amounts of purines. Causes of an elevated uric acid level include:  Being passed down from parent to child (heredity).  Diseases that cause increased uric acid production (such as obesity, psoriasis, and certain cancers).  Excessive alcohol use.  Diet, especially diets rich in meat and seafood.  Medicines, including certain cancer-fighting medicines (chemotherapy), water pills (diuretics), and aspirin.  Chronic kidney disease. The kidneys are no longer able to remove uric acid well.  Problems with metabolism. Conditions strongly associated with gout include:  Obesity.  High blood pressure.  High cholesterol.  Diabetes. Not everyone with elevated uric acid levels gets gout. It is not understood why some people get gout and others do not. Surgery, joint injury, and eating too much of certain foods are some of the factors that can lead to gout attacks. SYMPTOMS   An attack of gout comes on quickly. It causes intense pain with redness, swelling, and warmth in a joint.  Fever can occur.  Often, only one joint is involved. Certain joints are more  commonly involved:  Base of the big toe.  Knee.  Ankle.  Wrist.  Finger. Without treatment, an attack usually goes away in a few days to weeks. Between attacks, you usually will not have symptoms, which is different from many other forms of arthritis. DIAGNOSIS  Your caregiver will suspect gout based on your symptoms and exam. In some cases, tests may be recommended. The tests may include:  Blood tests.  Urine tests.  X-rays.  Joint fluid exam. This exam requires a needle to remove fluid from the joint (arthrocentesis). Using a microscope, gout is confirmed when uric acid crystals are seen in the joint fluid. TREATMENT  There are two phases to gout treatment: treating the sudden onset (acute) attack and preventing attacks (prophylaxis).  Treatment of an Acute Attack.  Medicines are used. These include anti-inflammatory medicines or steroid medicines.  An injection of steroid medicine into the affected joint is sometimes necessary.  The painful joint is rested. Movement can worsen the arthritis.  You may use warm or cold treatments on painful joints, depending which works best for you.  Treatment to Prevent Attacks.  If you suffer from frequent gout attacks, your caregiver may advise preventive medicine. These medicines are started after the acute attack subsides. These medicines either help your kidneys eliminate uric acid from your body or decrease your uric acid production. You may need to stay on these medicines for a very long time.  The early phase of treatment with preventive medicine can be associated with an increase in acute gout attacks. For this reason, during the first few months of treatment, your caregiver may  also advise you to take medicines usually used for acute gout treatment. Be sure you understand your caregiver's directions. Your caregiver may make several adjustments to your medicine dose before these medicines are effective.  Discuss dietary treatment  with your caregiver or dietitian. Alcohol and drinks high in sugar and fructose and foods such as meat, poultry, and seafood can increase uric acid levels. Your caregiver or dietitian can advise you on drinks and foods that should be limited. HOME CARE INSTRUCTIONS   Do not take aspirin to relieve pain. This raises uric acid levels.  Only take over-the-counter or prescription medicines for pain, discomfort, or fever as directed by your caregiver.  Rest the joint as much as possible. When in bed, keep sheets and blankets off painful areas.  Keep the affected joint raised (elevated).  Apply warm or cold treatments to painful joints. Use of warm or cold treatments depends on which works best for you.  Use crutches if the painful joint is in your leg.  Drink enough fluids to keep your urine clear or pale yellow. This helps your body get rid of uric acid. Limit alcohol, sugary drinks, and fructose drinks.  Follow your dietary instructions. Pay careful attention to the amount of protein you eat. Your daily diet should emphasize fruits, vegetables, whole grains, and fat-free or low-fat milk products. Discuss the use of coffee, vitamin C, and cherries with your caregiver or dietitian. These may be helpful in lowering uric acid levels.  Maintain a healthy body weight. SEEK MEDICAL CARE IF:   You develop diarrhea, vomiting, or any side effects from medicines.  You do not feel better in 24 hours, or you are getting worse. SEEK IMMEDIATE MEDICAL CARE IF:   Your joint becomes suddenly more tender, and you have chills or a fever. MAKE SURE YOU:   Understand these instructions.  Will watch your condition.  Will get help right away if you are not doing well or get worse.   This information is not intended to replace advice given to you by your health care provider. Make sure you discuss any questions you have with your health care provider.   Document Released: 07/21/2000 Document Revised:  08/14/2014 Document Reviewed: 03/06/2012 Elsevier Interactive Patient Education [employer]

## 2015-09-08 NOTE — ED Provider Notes (Signed)
CSN: [id number]     Arrival date & time 09/08/15  1017 History   First MD Initiated Contact with Patient 09/08/15 1031     Chief Complaint  Patient presents with  . Gout   HPI   60 year old male presents today with pain to his feet and elbow. Patient reports that 3 days ago he began to experience discomfort in his elbow and feet, this appeared to be improving until last night around 1 AM when he had an acute onset of pain to the feet bilateral. He reports this pain is sharp, "achy" and feels like a toothache. He reports the sudden of the previous gout flares. He denies any systemic symptoms including fever, chills, nausea, vomiting. Denies any redness or significant swelling to the feet or elbow. He denies any extension up into the ankles, trauma to the feet, or any signs of infectious etiology. Patient has not tried any medications prior to arrival. Patient denies any significant past medical history including diabetes, hypertension, renal impairment, hepatic impairment.    Past Medical History  Diagnosis Date  . Gout   . Lumbar hernia   . Hypertension   . Pneumonia 07/31/12  . GERD (gastroesophageal reflux disease)   . Back pain    Past Surgical History  Procedure Laterality Date  . Fertility surgery      stent  . Lumbar laminectomy/decompression microdiscectomy Right 01/01/2013    Procedure: LUMBAR LAMINECTOMY/DECOMPRESSION MICRODISCECTOMY 1 LEVEL;  Surgeon: Mariam Dollar, MD;  Location: [facility];  Service: Neurosurgery;  Laterality: Right;  Lumbar Laminectomies Lumbar Four-Five, Decompression, Discectomy   . Back surgery     History reviewed. No pertinent family history. Social History  Substance Use Topics  . Smoking status: Never Smoker   . Smokeless tobacco: Never Used  . Alcohol Use: No    Review of Systems  All other systems reviewed and are negative.   Allergies  Lisinopril  Home Medications   Prior to Admission medications   Medication Sig Start Date End Date  Taking? Authorizing Provider  aspirin 81 MG tablet Take 81 mg by mouth daily.   Yes Historical Provider, MD  atorvastatin (LIPITOR) 80 MG tablet Take 80 mg by mouth daily.   Yes Historical Provider, MD  metoprolol (LOPRESSOR) 50 MG tablet Take 50 mg by mouth 2 (two) times daily.   Yes Historical Provider, MD  Pantoprazole Sodium (PROTONIX PO) Take by mouth.   Yes Historical Provider, MD  colchicine 0.6 MG tablet Take 1 tablet (0.6 mg total) by mouth daily. Please take 1 tab twice today, 1 tab tomorrow, and 1 tab the following day 09/08/15   Eyvonne Mechanic, PA-C  metoprolol succinate (TOPROL-XL) 50 MG 24 hr tablet Take 50 mg by mouth 2 (two) times daily. Take with or immediately following a meal.    Historical Provider, MD  omeprazole (PRILOSEC) 20 MG capsule Take 20 mg by mouth 2 (two) times daily.    Historical Provider, MD   BP 123/74 mmHg  Pulse 72  Temp(Src) 98.4 F (36.9 C) (Oral)  Resp 16  Ht  (1.854 m)  Wt 127.914 kg  BMI 37.21 kg/m2  SpO2 100%   Physical Exam  Constitutional: He is oriented to person, place, and time. He appears well-developed and well-nourished.  HENT:  Head: Normocephalic and atraumatic.  Eyes: Conjunctivae are normal. Pupils are equal, round, and reactive to light. Right eye exhibits no discharge. Left eye exhibits no discharge. No scleral icterus.  Neck: Normal range of  motion. No JVD present. No tracheal deviation present.  Pulmonary/Chest: Effort normal. No stridor.  Musculoskeletal:  No obvious swelling redness, or deformities to the feet bilateral. Patient is exquisitely tender to even very light palpation of the feet. Ankles normal no swelling, reduced range of motion due to pain in the foot. No rashes noted  Elbow full active range of motion, nontender to palpation, no swelling or edema, no redness noted  Neurological: He is alert and oriented to person, place, and time. Coordination normal.  Psychiatric: He has a normal mood and affect. His  behavior is normal. Judgment and thought content normal.  Nursing note and vitals reviewed.   ED Course  Procedures (including critical care time) Labs Review Labs Reviewed - No data to display  Imaging Review No results found. I have personally reviewed and evaluated these images and lab results as part of my medical decision-making.   EKG Interpretation None      MDM   Final diagnoses:  Acute gout of foot, unspecified cause, unspecified laterality    Labs:  Imaging:  Consults:  Therapeutics: colchicine   Discharge Meds:  Colchicine   Assessment/Plan: 60 year old male presents today with likely gout flare. Patient has a history of same, reports this is identical to previous. Patient has no systemic symptoms, afebrile, nontoxic. I have very low suspicion for any septic arthritis. Patient will be given a dose of colchicine here. He is instructed to take 2 more doses today, followed by one dose tomorrow, one dose the day after. He is instructed follow up with his primary care provider tomorrow for reevaluation. He is given strict return precautions, verbalized understanding and agreement for today's plan.        Eyvonne Mechanic, PA-C 09/08/15 1119  Loren Racer, MD 09/08/15 [phone]

## 2015-09-27 ENCOUNTER — Emergency Department (HOSPITAL_BASED_OUTPATIENT_CLINIC_OR_DEPARTMENT_OTHER)
Admission: EM | Admit: 2015-09-27 | Discharge: 2015-09-28 | Disposition: A | Discharge status: Home / Self Care | Payer: Medicare Other | Attending: Emergency Medicine | Admitting: Emergency Medicine

## 2015-09-27 ENCOUNTER — Encounter (HOSPITAL_BASED_OUTPATIENT_CLINIC_OR_DEPARTMENT_OTHER): Payer: Self-pay | Admitting: Emergency Medicine

## 2015-09-27 DIAGNOSIS — I10 Essential (primary) hypertension: Secondary | ICD-10-CM | POA: Diagnosis not present

## 2015-09-27 DIAGNOSIS — Z7982 Long term (current) use of aspirin: Secondary | ICD-10-CM | POA: Diagnosis not present

## 2015-09-27 DIAGNOSIS — Z8701 Personal history of pneumonia (recurrent): Secondary | ICD-10-CM | POA: Insufficient documentation

## 2015-09-27 DIAGNOSIS — K219 Gastro-esophageal reflux disease without esophagitis: Secondary | ICD-10-CM | POA: Insufficient documentation

## 2015-09-27 DIAGNOSIS — Z79899 Other long term (current) drug therapy: Secondary | ICD-10-CM | POA: Insufficient documentation

## 2015-09-27 DIAGNOSIS — M109 Gout, unspecified: Secondary | ICD-10-CM

## 2015-09-27 DIAGNOSIS — M25521 Pain in right elbow: Secondary | ICD-10-CM | POA: Diagnosis present

## 2015-09-27 DIAGNOSIS — M10021 Idiopathic gout, right elbow: Secondary | ICD-10-CM | POA: Insufficient documentation

## 2015-09-27 NOTE — ED Notes (Signed)
Pt reports Gout flare - has pain to right elbow

## 2015-09-27 NOTE — ED Notes (Signed)
Pa  at bedside. 

## 2015-09-28 MED ORDER — KETOROLAC TROMETHAMINE 30 MG/ML IJ SOLN
30.0000 mg | Freq: Once | INTRAMUSCULAR | Status: AC
  Administered 2015-09-28: 30 mg via INTRAMUSCULAR
  Filled 2015-09-28: qty 1

## 2015-09-28 MED ORDER — PREDNISONE 50 MG PO TABS
60.0000 mg | ORAL_TABLET | Freq: Once | ORAL | Status: AC
  Administered 2015-09-28: 60 mg via ORAL
  Filled 2015-09-28: qty 1

## 2015-09-28 MED ORDER — NAPROXEN 500 MG PO TABS: 500.0000 mg | ORAL_TABLET | Freq: Two times a day (BID) | ORAL | Status: DC

## 2015-09-28 MED ORDER — PREDNISONE 10 MG PO TABS: 20.0000 mg | ORAL_TABLET | Freq: Every day | ORAL | Status: DC

## 2015-09-28 MED ORDER — COLCHICINE 0.6 MG PO TABS: 0.6000 mg | ORAL_TABLET | Freq: Once | ORAL | Status: DC

## 2015-09-28 MED ORDER — OXYCODONE-ACETAMINOPHEN 5-325 MG PO TABS: 2.0000 | ORAL_TABLET | ORAL | Status: DC | PRN

## 2015-09-28 MED ORDER — OXYCODONE-ACETAMINOPHEN 5-325 MG PO TABS
1.0000 | ORAL_TABLET | Freq: Once | ORAL | Status: AC
  Administered 2015-09-28: 1 via ORAL
  Filled 2015-09-28: qty 1

## 2015-09-28 NOTE — ED Provider Notes (Signed)
CSN: [id number]     Arrival date & time 09/27/15  2054 History   First MD Initiated Contact with Patient 09/27/15 2347     Chief Complaint  Patient presents with  . Elbow Pain   HPI   60 year old male presents today with complaints of right elbow pain. Patient reports symptoms started 2 days ago with sharp pain in his elbow. He describes this is achy with sharp episodes. Patient reports this is identical to previous gout flares, most recent at the beginning of February treated with colchicine. Patient reports he is unable to afford colchicine, and has not tried any medications prior to arrival. Patient denies any systemic symptoms including fever, chills, nausea, vomiting. Patient reports he is able to flex and extend the elbow, denies any associated redness, reports some minor warmth to touch of the elbow. He denies any other joints involved, and reports he does not drink alcohol and does not eat high protein diet. Patient denies any significant past medical history including diabetes Or hypertension, renal, hepatic impairment  Past Medical History  Diagnosis Date  . Gout   . Lumbar hernia   . Hypertension   . Pneumonia 07/31/12  . GERD (gastroesophageal reflux disease)   . Back pain    Past Surgical History  Procedure Laterality Date  . Fertility surgery      stent  . Lumbar laminectomy/decompression microdiscectomy Right 01/01/2013    Procedure: LUMBAR LAMINECTOMY/DECOMPRESSION MICRODISCECTOMY 1 LEVEL;  Surgeon: Mariam Dollar, MD;  Location: [facility];  Service: Neurosurgery;  Laterality: Right;  Lumbar Laminectomies Lumbar Four-Five, Decompression, Discectomy   . Back surgery     History reviewed. No pertinent family history. Social History  Substance Use Topics  . Smoking status: Never Smoker   . Smokeless tobacco: Never Used  . Alcohol Use: No    Review of Systems  All other systems reviewed and are negative.   Allergies  Lisinopril  Home Medications   Prior to  Admission medications   Medication Sig Start Date End Date Taking? Authorizing Provider  aspirin 81 MG tablet Take 81 mg by mouth daily.    Historical Provider, MD  atorvastatin (LIPITOR) 80 MG tablet Take 80 mg by mouth daily.    Historical Provider, MD  colchicine 0.6 MG tablet Take 1 tablet (0.6 mg total) by mouth daily. Please take 1 tab twice today, 1 tab tomorrow, and 1 tab the following day 09/08/15   Eyvonne Mechanic, PA-C  metoprolol (LOPRESSOR) 50 MG tablet Take 50 mg by mouth 2 (two) times daily.    Historical Provider, MD  metoprolol succinate (TOPROL-XL) 50 MG 24 hr tablet Take 50 mg by mouth 2 (two) times daily. Take with or immediately following a meal.    Historical Provider, MD  naproxen (NAPROSYN) 500 MG tablet Take 1 tablet (500 mg total) by mouth 2 (two) times daily. 09/28/15   Eyvonne Mechanic, PA-C  omeprazole (PRILOSEC) 20 MG capsule Take 20 mg by mouth 2 (two) times daily.    Historical Provider, MD  oxyCODONE-acetaminophen (PERCOCET/ROXICET) 5-325 MG tablet Take 2 tablets by mouth every 4 (four) hours as needed for severe pain. 09/28/15   Eyvonne Mechanic, PA-C  Pantoprazole Sodium (PROTONIX PO) Take by mouth.    Historical Provider, MD  predniSONE (DELTASONE) 10 MG tablet Take 2 tablets (20 mg total) by mouth daily. 09/28/15   Eyvonne Mechanic, PA-C   BP 141/91 mmHg  Pulse 75  Temp(Src) 99 F (37.2 C) (Oral)  Resp 18  Ht   (1.854 m)  Wt 127.914 kg  BMI 37.21 kg/m2  SpO2 100% Physical Exam  Constitutional: He is oriented to person, place, and time. He appears well-developed and well-nourished.  HENT:  Head: Normocephalic and atraumatic.  Eyes: Conjunctivae are normal. Pupils are equal, round, and reactive to light. Right eye exhibits no discharge. Left eye exhibits no discharge. No scleral icterus.  Neck: Normal range of motion. No JVD present. No tracheal deviation present.  Pulmonary/Chest: Effort normal. No stridor.  Musculoskeletal:  Tenderness to palpation of the  right elbow, minor swelling, minor warmth to touch, no significant redness. No other joints with similar presentation. Patient is able to flex the elbow greater than 90.  Neurological: He is alert and oriented to person, place, and time. Coordination normal.  Psychiatric: He has a normal mood and affect. His behavior is normal. Judgment and thought content normal.  Nursing note and vitals reviewed.   ED Course  Procedures (including critical care time) Labs Review Labs Reviewed - No data to display  Imaging Review No results found. I have personally reviewed and evaluated these images and lab results as part of my medical decision-making.   EKG Interpretation None      MDM   Final diagnoses:  Acute gout of right elbow, unspecified cause    Labs:  Imaging:  Consults:  Therapeutics: Prednisone, Toradol, Percocet  Discharge Meds: Prednisone, naproxen, Percocet  Assessment/Plan: Patient's presentation is most consistent with gout. He reports he cannot afford colchicine. Patient will be given a shot of Toradol here, Percocet. He'll be discharged home with prednisone, Percocet, ibuprofen. He is instructed follow-up with his primary care provider for reevaluation, return to ED if any new or worsening signs or symptoms present. Patient is afebrile, nontoxic in good health. Patient verbalized understanding and agreement to this plan and had no further questions or concerns at this time discharged         Eyvonne Mechanic, PA-C 09/28/15 0145  Eyvonne Mechanic, PA-C 09/28/15 6295  Paula Libra, MD 09/28/15 2841

## 2015-09-28 NOTE — Discharge Instructions (Signed)

## 2016-01-17 ENCOUNTER — Encounter (HOSPITAL_BASED_OUTPATIENT_CLINIC_OR_DEPARTMENT_OTHER): Payer: Self-pay

## 2016-01-17 ENCOUNTER — Emergency Department (HOSPITAL_BASED_OUTPATIENT_CLINIC_OR_DEPARTMENT_OTHER)
Admission: EM | Admit: 2016-01-17 | Discharge: 2016-01-17 | Disposition: A | Discharge status: Home / Self Care | Payer: Medicare Other | Attending: Emergency Medicine | Admitting: Emergency Medicine

## 2016-01-17 DIAGNOSIS — Z79899 Other long term (current) drug therapy: Secondary | ICD-10-CM | POA: Diagnosis not present

## 2016-01-17 DIAGNOSIS — Z7982 Long term (current) use of aspirin: Secondary | ICD-10-CM | POA: Diagnosis not present

## 2016-01-17 DIAGNOSIS — I10 Essential (primary) hypertension: Secondary | ICD-10-CM | POA: Diagnosis not present

## 2016-01-17 DIAGNOSIS — M109 Gout, unspecified: Secondary | ICD-10-CM | POA: Diagnosis not present

## 2016-01-17 DIAGNOSIS — M25572 Pain in left ankle and joints of left foot: Secondary | ICD-10-CM | POA: Diagnosis present

## 2016-01-17 MED ORDER — PREDNISONE 50 MG PO TABS
60.0000 mg | ORAL_TABLET | Freq: Once | ORAL | Status: AC
  Administered 2016-01-17: 60 mg via ORAL
  Filled 2016-01-17: qty 1

## 2016-01-17 MED ORDER — PREDNISONE 50 MG PO TABS
ORAL_TABLET | ORAL | Status: AC
Start: 2016-01-18 — End: 2016-01-22

## 2016-01-17 MED ORDER — NAPROXEN 500 MG PO TABS: 500.0000 mg | ORAL_TABLET | Freq: Two times a day (BID) | ORAL | Status: DC

## 2016-01-17 MED ORDER — KETOROLAC TROMETHAMINE 60 MG/2ML IM SOLN
60.0000 mg | Freq: Once | INTRAMUSCULAR | Status: AC
  Administered 2016-01-17: 60 mg via INTRAMUSCULAR
  Filled 2016-01-17: qty 2

## 2016-01-17 MED FILL — predniSONE 50 MG TABS: 50 | 5 days supply | Qty: 5 | Fill #0

## 2016-01-17 MED FILL — NAPROXEN 500 MG TABLET: 500 | 15 days supply | Qty: 30 | Fill #0

## 2016-01-17 NOTE — ED Notes (Addendum)
C/o left ankle pain x 1 week-states feels like gout pain-denies injury-to triage in w/c-with own cane in lap

## 2016-01-17 NOTE — Discharge Instructions (Signed)

## 2016-01-17 NOTE — ED Provider Notes (Signed)
CSN: [id number]     Arrival date & time 01/17/16  1638 History   First MD Initiated Contact with Patient 01/17/16 1654     Chief Complaint  Patient presents with  . Ankle Pain   HPI  Aaron Marquez is a 60 year old male with PMHx of HTN and gout presenting with left ankle pain. Onset of symptoms was one week ago with worsening today. Denies injury to his ankle. He reports a history of gout which typically flares in his ankles and elbows. Pt states his current pain feels identical to old gout flares. The pain is aching and throbbing. The pain does not radiate. He has been having difficulty ambulating due to the pain and has been using a cane. Denies redness or warmth of the ankle. He has colchicine at home and has taken it daily for the past 3 days without relief of symptoms. He does note that he ate a steak last evening prior to worsening of the pain. Denies pain in his elbows or any other joint involvement. Denies fevers, chills, nausea, vomiting, dizziness, calf pain, numbness or weakness. His PCP is Dr. Bary CastillaKalil.    Past Medical History  Diagnosis Date  . Gout   . Lumbar hernia   . Hypertension   . Pneumonia 07/31/12  . GERD (gastroesophageal reflux disease)   . Back pain    Past Surgical History  Procedure Laterality Date  . Fertility surgery      stent  . Lumbar laminectomy/decompression microdiscectomy Right 01/01/2013    Procedure: LUMBAR LAMINECTOMY/DECOMPRESSION MICRODISCECTOMY 1 LEVEL;  Surgeon: Mariam DollarGary P Cram, MD;  Location: [facility];  Service: Neurosurgery;  Laterality: Right;  Lumbar Laminectomies Lumbar Four-Five, Decompression, Discectomy   . Back surgery     No family history on file. Social History  Substance Use Topics  . Smoking status: Never Smoker   . Smokeless tobacco: Never Used  . Alcohol Use: No    Review of Systems  All other systems reviewed and are negative.     Allergies  Lisinopril  Home Medications   Prior to Admission medications   Medication Sig  Start Date End Date Taking? Authorizing Provider  aspirin 81 MG tablet Take 81 mg by mouth daily.    Historical Provider, MD  atorvastatin (LIPITOR) 80 MG tablet Take 80 mg by mouth daily.    Historical Provider, MD  colchicine 0.6 MG tablet Take 1 tablet (0.6 mg total) by mouth daily. Please take 1 tab twice today, 1 tab tomorrow, and 1 tab the following day 09/08/15   Eyvonne MechanicJeffrey Hedges, PA-C  metoprolol (LOPRESSOR) 50 MG tablet Take 50 mg by mouth 2 (two) times daily.    Historical Provider, MD  metoprolol succinate (TOPROL-XL) 50 MG 24 hr tablet Take 50 mg by mouth 2 (two) times daily. Take with or immediately following a meal.    Historical Provider, MD  naproxen (NAPROSYN) 500 MG tablet Take 1 tablet (500 mg total) by mouth 2 (two) times daily. 01/17/16   Roben Schliep, PA-C  omeprazole (PRILOSEC) 20 MG capsule Take 20 mg by mouth 2 (two) times daily.    Historical Provider, MD  predniSONE (DELTASONE) 50 MG tablet Take 1 tablet once a day for 5 days 01/18/16 01/22/16  Alisabeth Selkirk, PA-C   BP 140/88 mmHg  Pulse 82  Temp(Src) 98.5 F (36.9 C) (Oral)  Resp 18  Ht 6\' 1"  (1.854 m)  Wt 126.1 kg  BMI 36.69 kg/m2  SpO2 96% Physical Exam  Constitutional:  He appears well-developed and well-nourished. No distress.  HENT:  Head: Normocephalic and atraumatic.  Right Ear: External ear normal.  Left Ear: External ear normal.  Eyes: Conjunctivae are normal. Right eye exhibits no discharge. Left eye exhibits no discharge. No scleral icterus.  Neck: Normal range of motion.  Cardiovascular: Normal rate and intact distal pulses.   Pedal pulse palpable  Pulmonary/Chest: Effort normal.  Musculoskeletal:       Left ankle: He exhibits decreased range of motion. He exhibits no swelling. Tenderness.       Feet:  Left ankle with significant TTP as indicated in diagram. Decreased active ROM secondary to pain. FROM of toes intact. No significant swelling of the ankle. No erythema or warmth. Right ankle is  non-tender with FROM intact.   Neurological: He is alert. Coordination normal.  Strength testing deferred due to pain. Sensation to light touch intact.   Skin: Skin is warm and dry. No erythema.  Psychiatric: He has a normal mood and affect. His behavior is normal.  Nursing note and vitals reviewed.   ED Course  Procedures (including critical care time) Labs Review Labs Reviewed - No data to display  Imaging Review No results found. I have personally reviewed and evaluated these images and lab results as part of my medical decision-making.   EKG Interpretation None      MDM   Final diagnoses:  Acute gout of left ankle, unspecified cause   Pt presents with monoarticular pain, swelling and pain x 1 week. Pt has known history of gout and is taking colchicine at home without relief. Pt is afebrile and hemodynamically stable. Tenderness of left ankle without significant swelling. There is no warmth, erythema or signs of septic joint. Pt without known peptic ulcer disease and no blood thinners. Pt discharge with naprosyn and prednisone burst. Discussed that pt should follow up with PCP in next few days to monitor symptom resolution. Also discussed gout diet. Return precautions given in discharge paperwork and discussed with pt at bedside. Pt stable for discharge      Alveta Heimlich, PA-C 01/17/16 1719  Alvira Monday, MD 01/19/16 1657

## 2016-02-22 IMAGING — DX DG CHEST 2V
2 series · 2 of 2 positions shown · non-contrast
Comparison: 03/02/2014 chest radiograph

CLINICAL DATA: Shortness of breath.  Productive cough.

EXAM:
CHEST  2 VIEW

[chest pa]
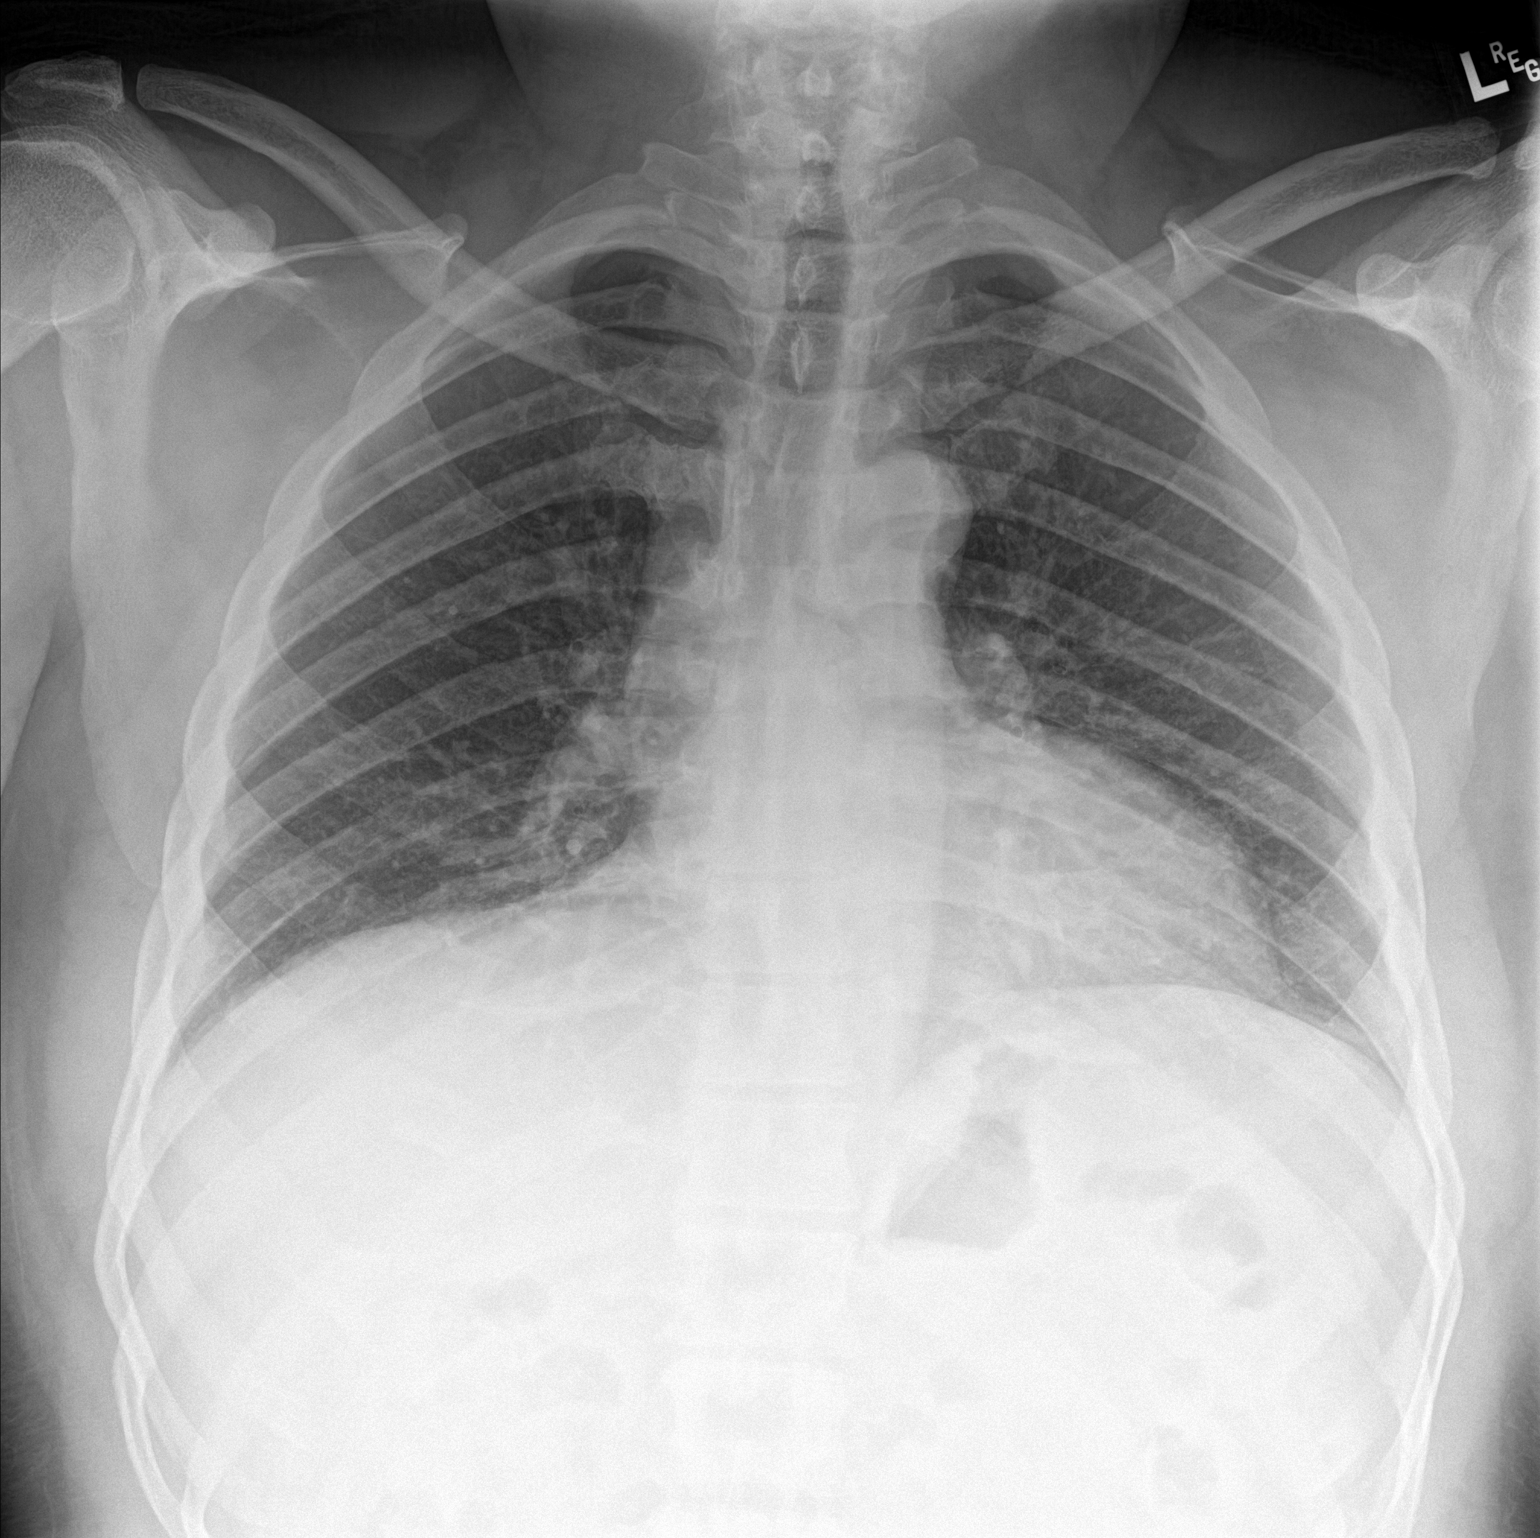

[chest lat]
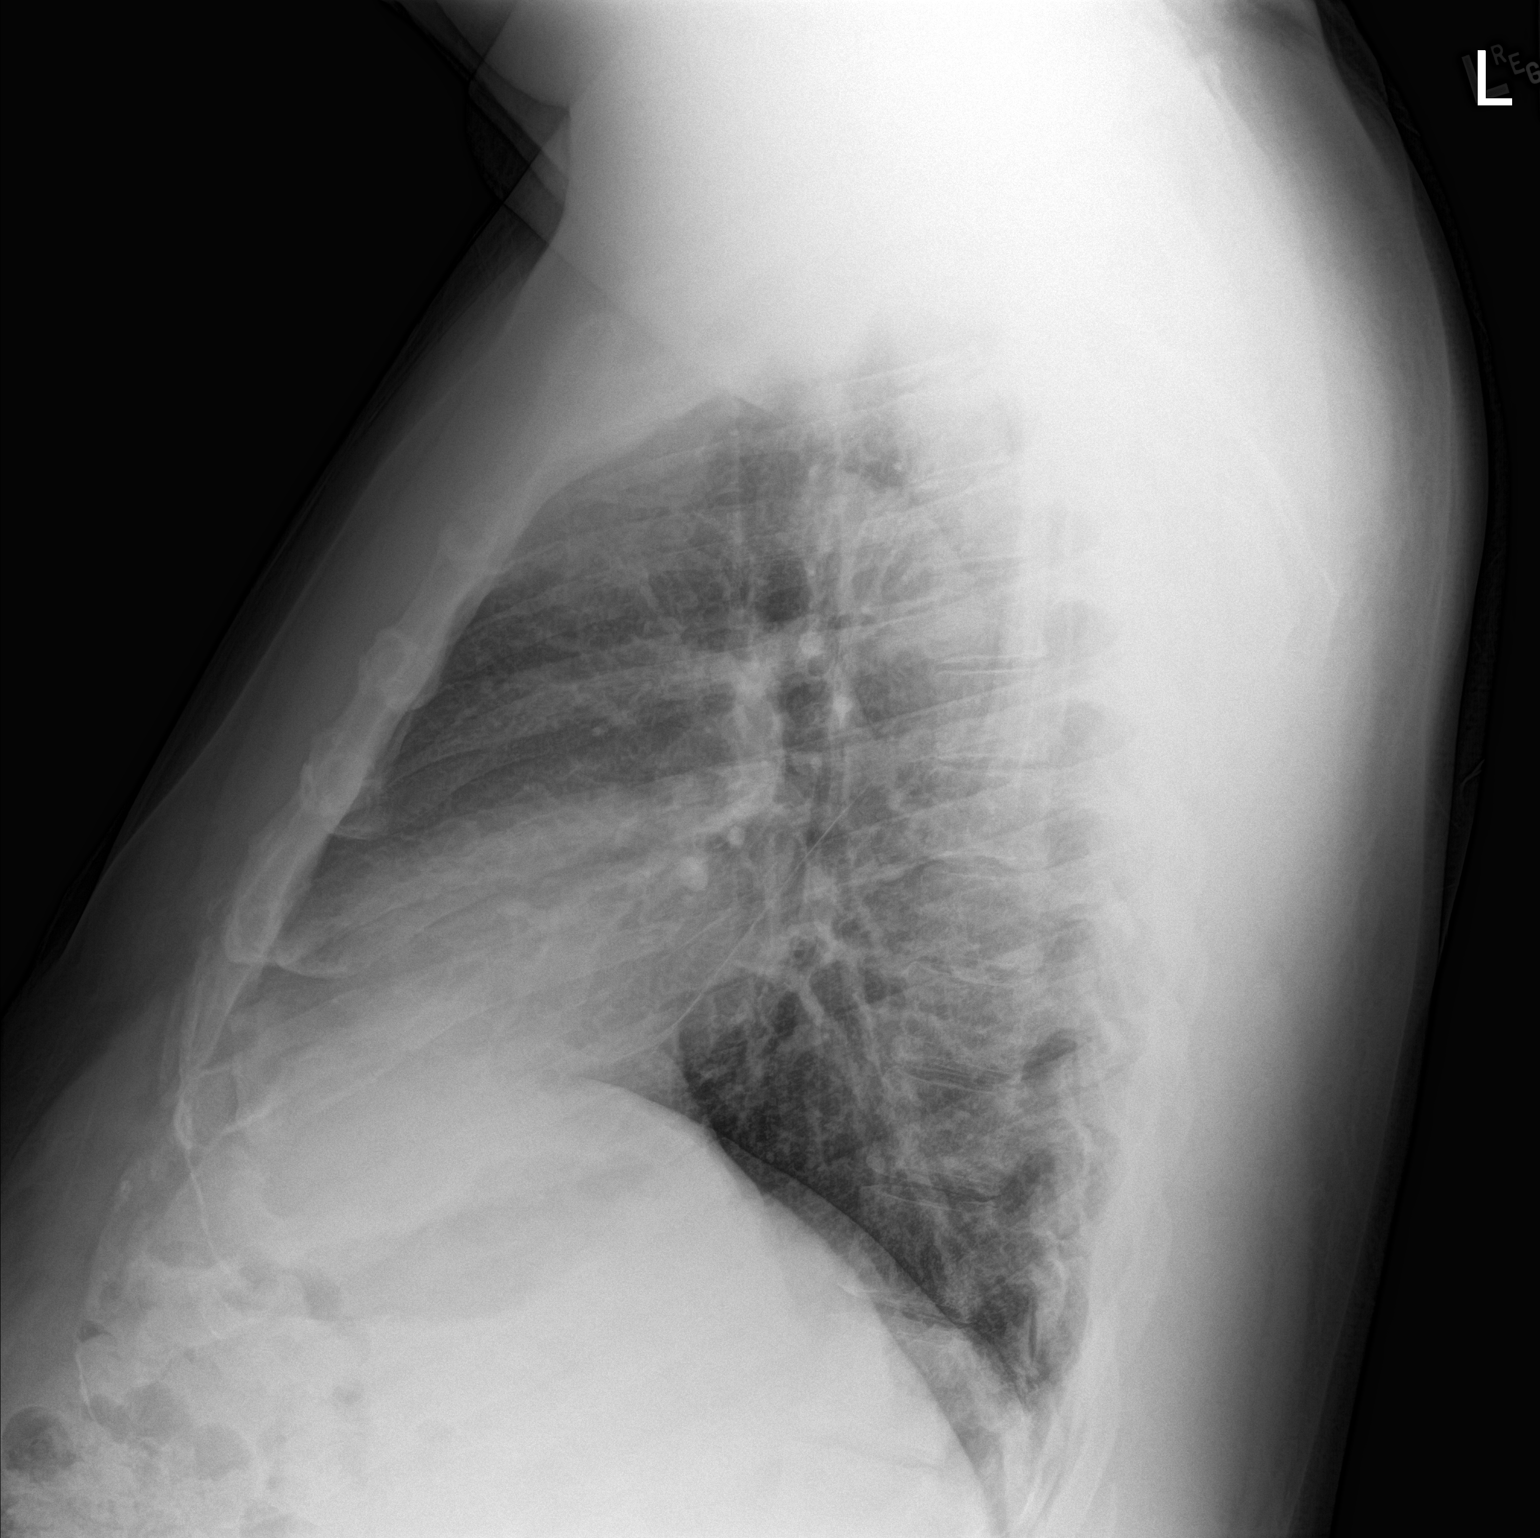

[2 of 2 positions shown; findings below may reference images not displayed]

FINDINGS: Slightly low lung volumes on the frontal view. Stable
cardiomediastinal silhouette with normal heart size. No
pneumothorax. No pleural effusion. Clear lungs, with no focal lung
consolidation and no pulmonary edema.
IMPRESSION: No active cardiopulmonary disease.

## 2016-05-08 ENCOUNTER — Encounter (HOSPITAL_BASED_OUTPATIENT_CLINIC_OR_DEPARTMENT_OTHER): Payer: Self-pay

## 2016-05-08 ENCOUNTER — Emergency Department (HOSPITAL_BASED_OUTPATIENT_CLINIC_OR_DEPARTMENT_OTHER)
Admission: EM | Admit: 2016-05-08 | Discharge: 2016-05-09 | Disposition: A | Discharge status: Home / Self Care | Payer: Medicare Other | Attending: Emergency Medicine | Admitting: Emergency Medicine

## 2016-05-08 DIAGNOSIS — I10 Essential (primary) hypertension: Secondary | ICD-10-CM | POA: Insufficient documentation

## 2016-05-08 DIAGNOSIS — Z79899 Other long term (current) drug therapy: Secondary | ICD-10-CM | POA: Diagnosis not present

## 2016-05-08 DIAGNOSIS — Z7982 Long term (current) use of aspirin: Secondary | ICD-10-CM | POA: Diagnosis not present

## 2016-05-08 DIAGNOSIS — M79672 Pain in left foot: Secondary | ICD-10-CM | POA: Insufficient documentation

## 2016-05-08 MED ORDER — DEXAMETHASONE SODIUM PHOSPHATE 10 MG/ML IJ SOLN
10.0000 mg | Freq: Once | INTRAMUSCULAR | Status: AC
  Administered 2016-05-08: 10 mg via INTRAMUSCULAR
  Filled 2016-05-08: qty 1

## 2016-05-08 NOTE — ED Triage Notes (Signed)
C/o pain to left foot x 2 days-denies injury-"might be gout"-NAD-presents to triage in w/c

## 2016-05-08 NOTE — ED Provider Notes (Signed)
[facility] Provider Note   CSN: [id number] Arrival date & time: 05/08/16  2208  By signing my name below, I, Phillis Haggis, attest that this documentation has been prepared under the direction and in the presence of Fayrene Helper, PA-C. Electronically Signed: Phillis Haggis, ED Scribe. 05/08/16. 11:48 PM.  History   Chief Complaint Chief Complaint  Patient presents with  . Foot Pain   The history is provided by the patient. No language interpreter was used.   HPI Comments: Aaron Marquez is a 60 y.o. male with a hx of gout and HTN who presents to the Emergency Department complaining of left foot and heel pain onset 2 days ago. Pain is sharp, throbbing, non radiating, worsening with ambulating and improves with rest. Pt took allopurinol for his symptoms this morning. He takes allopurinol 1x a day. Pt says that his last gout flare up was in June for which he received IM injection to relief. Pt says that the pain feels similar to prior hx of gout. He is able to ambulate but reports pain in the heel with doing so. Pt does not take Colchicine. Wife states that pt does not drink, but does not adhere to a diet conducive to gout control.He denies new injury or trauma, swelling, rash, color change, wound, numbness, or weakness. Pt is allergic to Lisinopril.   Past Medical History:  Diagnosis Date  . Back pain   . GERD (gastroesophageal reflux disease)   . Gout   . Hypertension   . Lumbar hernia   . Pneumonia 07/31/12   There are no active problems to display for this patient.  Past Surgical History:  Procedure Laterality Date  . BACK SURGERY    . FERTILITY SURGERY     stent  . LUMBAR LAMINECTOMY/DECOMPRESSION MICRODISCECTOMY Right 01/01/2013   Procedure: LUMBAR LAMINECTOMY/DECOMPRESSION MICRODISCECTOMY 1 LEVEL;  Surgeon: Mariam Dollar, MD;  Location: [facility];  Service: Neurosurgery;  Laterality: Right;  Lumbar Laminectomies Lumbar Four-Five, Decompression, Discectomy       Home Medications    Prior to Admission medications   Medication Sig Start Date End Date Taking? Authorizing Provider  aspirin 81 MG tablet Take 81 mg by mouth daily.    Historical Provider, MD  atorvastatin (LIPITOR) 80 MG tablet Take 80 mg by mouth daily.    Historical Provider, MD  colchicine 0.6 MG tablet Take 1 tablet (0.6 mg total) by mouth daily. Please take 1 tab twice today, 1 tab tomorrow, and 1 tab the following day 09/08/15   Eyvonne Mechanic, PA-C  metoprolol (LOPRESSOR) 50 MG tablet Take 50 mg by mouth 2 (two) times daily.    Historical Provider, MD  metoprolol succinate (TOPROL-XL) 50 MG 24 hr tablet Take 50 mg by mouth 2 (two) times daily. Take with or immediately following a meal.    Historical Provider, MD  naproxen (NAPROSYN) 500 MG tablet Take 1 tablet (500 mg total) by mouth 2 (two) times daily. 01/17/16   Stevi Barrett, PA-C  omeprazole (PRILOSEC) 20 MG capsule Take 20 mg by mouth 2 (two) times daily.    Historical Provider, MD   Family History No family history on file.  Social History Social History  Substance Use Topics  . Smoking status: Never Smoker  . Smokeless tobacco: Never Used  . Alcohol use No   Allergies   Lisinopril  Review of Systems Review of Systems  Musculoskeletal: Positive for arthralgias. Negative for joint swelling.  Skin: Negative for color change, rash and wound.  Neurological: Negative for weakness and numbness.     Physical Exam Updated Vital Signs BP 174/99 (BP Location: Right Arm)   Pulse 78   Temp 97.9 F (36.6 C) (Oral)   Resp 18   Ht 6\' 1"  (1.854 m)   Wt 264 lb (119.7 kg)   SpO2 98%   BMI 34.83 kg/m   Physical Exam  Constitutional: He is oriented to person, place, and time. He appears well-developed and well-nourished.  HENT:  Head: Normocephalic and atraumatic.  Mouth/Throat: Oropharynx is clear and moist.  Eyes: Conjunctivae and EOM are normal. Pupils are equal, round, and reactive to light.  Neck: Normal  range of motion. Neck supple.  Cardiovascular:  Pulses:      Dorsalis pedis pulses are 2+ on the right side, and 2+ on the left side.  Musculoskeletal: Normal range of motion.       Left foot: There is tenderness. There is no swelling.  Tenderness noted to the posterior left heel upon palpation with mild edema but no erythema or warmth; DP pulse palpable; pain with ROM, but ROM is maintained. LLE compartments are soft  Neurological: He is alert and oriented to person, place, and time.  Skin: Skin is warm and dry. Capillary refill takes less than 2 seconds.  Psychiatric: He has a normal mood and affect. His behavior is normal.  Nursing note and vitals reviewed.  ED Treatments / Results  DIAGNOSTIC STUDIES: Oxygen Saturation is 98% on RA, normal by my interpretation.    COORDINATION OF CARE: 11:44 PM-Discussed treatment plan which includes decadron, colchicine, and indomethacin  with pt at bedside and pt agreed to plan.   Labs (all labs ordered are listed, but only abnormal results are displayed) Labs Reviewed - No data to display  EKG  EKG Interpretation None       Radiology No results found.  Procedures Procedures (including critical care time)  Medications Ordered in ED Medications - No data to display  Initial Impression / Assessment and Plan / ED Course  I have reviewed the triage vital signs and the nursing notes.  Pertinent labs & imaging results that were available during my care of the patient were reviewed by me and considered in my medical decision making (see chart for details).  Clinical Course   BP 174/99 (BP Location: Right Arm)   Pulse 78   Temp 97.9 F (36.6 C) (Oral)   Resp 18   Ht 6\' 1"  (1.854 m)   Wt 119.7 kg   SpO2 98%   BMI 34.83 kg/m  The patient was noted to be hypertensive today in the emergency department. I have spoken with the patient regarding hypertension and the need for improved management. I instructed the patient to followup with  the Primary care doctor within 4 days to improve the management of the patient's hypertension. I also counseled the patient regarding the signs and symptoms which would require an emergent visit to an emergency department for hypertensive urgency and/or hypertensive emergency. The patient understood the need for improved hypertensive management.   Patient with typical gout flare-up. Doubt DVT, septic arthritis, cellulitis, or other acute emergent medical condition.  Pain managed in ED with IM Decadron. Pt advised to follow up with PCP if symptoms persist. Conservative therapy recommended and discussed. Patient will be dc home with a short course of steroid and Indomethacin & is agreeable with above plan. Unable to prescribe Colchicine as pt is currently on atorvastatin which may increase risk of myalgias when  taking both together.    Final Clinical Impressions(s) / ED Diagnoses   Final diagnoses:  Foot pain, left   I personally performed the services described in this documentation, which was scribed in my presence. The recorded information has been reviewed and is accurate.     New Prescriptions New Prescriptions   INDOMETHACIN (INDOCIN) 25 MG CAPSULE    Take 1 capsule (25 mg total) by mouth 3 (three) times daily as needed for moderate pain.   PREDNISONE (DELTASONE) 20 MG TABLET    3 tabs po day one, then 2 tabs daily x 4 days     Fayrene Helper, PA-C 05/09/16 0024    April Palumbo, MD 05/09/16 0031

## 2016-05-09 MED ORDER — INDOMETHACIN 25 MG PO CAPS: 25.0000 mg | ORAL_CAPSULE | Freq: Three times a day (TID) | ORAL | 0 refills | Status: DC | PRN

## 2016-05-09 MED ORDER — PREDNISONE 20 MG PO TABS: ORAL_TABLET | ORAL | 0 refills | Status: DC

## 2016-05-09 NOTE — Discharge Instructions (Signed)
Your foot pain is likely due to gout.  Please take steroid and anti-inflammatory medication as prescribed.  Avoid food that may increase risk of gout flare such as red meat, hot dog, alcohol, etc...  Follow up with your doctor for further care.  Return to the ER if you have any concerns.

## 2016-08-02 ENCOUNTER — Encounter (HOSPITAL_BASED_OUTPATIENT_CLINIC_OR_DEPARTMENT_OTHER): Payer: Self-pay | Admitting: *Deleted

## 2016-08-02 ENCOUNTER — Emergency Department (HOSPITAL_BASED_OUTPATIENT_CLINIC_OR_DEPARTMENT_OTHER)
Admission: EM | Admit: 2016-08-02 | Discharge: 2016-08-02 | Disposition: A | Discharge status: Home / Self Care | Payer: Medicare Other | Attending: Emergency Medicine | Admitting: Emergency Medicine

## 2016-08-02 DIAGNOSIS — Z7982 Long term (current) use of aspirin: Secondary | ICD-10-CM | POA: Insufficient documentation

## 2016-08-02 DIAGNOSIS — R269 Unspecified abnormalities of gait and mobility: Secondary | ICD-10-CM | POA: Insufficient documentation

## 2016-08-02 DIAGNOSIS — I10 Essential (primary) hypertension: Secondary | ICD-10-CM | POA: Insufficient documentation

## 2016-08-02 DIAGNOSIS — M545 Low back pain: Secondary | ICD-10-CM | POA: Insufficient documentation

## 2016-08-02 DIAGNOSIS — G8929 Other chronic pain: Secondary | ICD-10-CM | POA: Diagnosis not present

## 2016-08-02 MED ORDER — METHOCARBAMOL 500 MG PO TABS: 500.0000 mg | ORAL_TABLET | Freq: Every evening | ORAL | 0 refills | Status: DC | PRN

## 2016-08-02 MED ORDER — DEXAMETHASONE SODIUM PHOSPHATE 10 MG/ML IJ SOLN
10.0000 mg | Freq: Once | INTRAMUSCULAR | Status: AC
  Administered 2016-08-02: 10 mg via INTRAVENOUS
  Filled 2016-08-02: qty 1

## 2016-08-02 MED ORDER — KETOROLAC TROMETHAMINE 60 MG/2ML IM SOLN: 60.0000 mg | Freq: Once | INTRAMUSCULAR | Status: DC

## 2016-08-02 MED ORDER — TRAMADOL-ACETAMINOPHEN 37.5-325 MG PO TABS: 1.0000 | ORAL_TABLET | Freq: Four times a day (QID) | ORAL | 0 refills | Status: DC | PRN

## 2016-08-02 MED FILL — METHOCARBAMOL 500 MG TABLET: 500 | 5 days supply | Qty: 10 | Fill #0

## 2016-08-02 MED FILL — TRAMADOL-ACETAMINOPHN 37.5-: 37.5-325 | 2 days supply | Qty: 10 | Fill #0

## 2016-08-02 NOTE — Discharge Instructions (Signed)
Take Ultracet for the next week. Avoid NSAIDs (Ibuprofen, Advil, Naproxen, Aleve) Take muscle relaxer at bedtime to help you sleep. This medicine makes you drowsy so do not take before driving or work Use a heating pad for sore muscles - use for 20 minutes several times a day If you develop a rash, please be seen again by a provider. You may need treatment for shingles

## 2016-08-02 NOTE — ED Provider Notes (Signed)
[facility]   CSN: [id number] Arrival date & time: 08/02/16  0846     History   Chief Complaint Chief Complaint  Patient presents with  . Back Pain    HPI Aaron Marquez is a 60 y.o. male who presents with low right sided back pain. PMH significant for chronic back pain, hx of lumbar laminectomy of L4-L5 in 2014. He states over the past week he has had low back pain. It is predominantly on the right side of his lower back. It is worse with movement and lying on his side. Feels like a burning pain. Reports difficulty walking. He has been ambulating with a cane. No fever, syncope, trauma, unexplained weight loss, hx of cancer, loss of bowel/bladder function, saddle anesthesia, urinary retention, IVDU. He denies ever having shingles or rash.  HPI  Past Medical History:  Diagnosis Date  . Back pain   . GERD (gastroesophageal reflux disease)   . Gout   . Hypertension   . Lumbar hernia   . Pneumonia 07/31/12    There are no active problems to display for this patient.   Past Surgical History:  Procedure Laterality Date  . BACK SURGERY    . FERTILITY SURGERY     stent  . LUMBAR LAMINECTOMY/DECOMPRESSION MICRODISCECTOMY Right 01/01/2013   Procedure: LUMBAR LAMINECTOMY/DECOMPRESSION MICRODISCECTOMY 1 LEVEL;  Surgeon: Mariam DollarGary P Cram, MD;  Location: [facility];  Service: Neurosurgery;  Laterality: Right;  Lumbar Laminectomies Lumbar Four-Five, Decompression, Discectomy        Home Medications    Prior to Admission medications   Medication Sig Start Date End Date Taking? Authorizing Provider  aspirin 81 MG tablet Take 81 mg by mouth daily.    Historical Provider, MD  atorvastatin (LIPITOR) 80 MG tablet Take 80 mg by mouth daily.    Historical Provider, MD  colchicine 0.6 MG tablet Take 1 tablet (0.6 mg total) by mouth daily. Please take 1 tab twice today, 1 tab tomorrow, and 1 tab the following day 09/08/15   Eyvonne MechanicJeffrey Hedges, PA-C  indomethacin (INDOCIN)  25 MG capsule Take 1 capsule (25 mg total) by mouth 3 (three) times daily as needed for moderate pain. 05/09/16   Fayrene HelperBowie Tran, PA-C  metoprolol (LOPRESSOR) 50 MG tablet Take 50 mg by mouth 2 (two) times daily.    Historical Provider, MD  metoprolol succinate (TOPROL-XL) 50 MG 24 hr tablet Take 50 mg by mouth 2 (two) times daily. Take with or immediately following a meal.    Historical Provider, MD  omeprazole (PRILOSEC) 20 MG capsule Take 20 mg by mouth 2 (two) times daily.    Historical Provider, MD  predniSONE (DELTASONE) 20 MG tablet 3 tabs po day one, then 2 tabs daily x 4 days 05/09/16   Fayrene HelperBowie Tran, PA-C    Family History No family history on file.  Social History Social History  Substance Use Topics  . Smoking status: Never Smoker  . Smokeless tobacco: Never Used  . Alcohol use No     Allergies   Lisinopril   Review of Systems Review of Systems  Constitutional: Negative for chills and fever.  Genitourinary:       -bowel/bladder incontinence  Musculoskeletal: Positive for back pain and gait problem.  Skin: Negative for rash.     Physical Exam Updated Vital Signs BP (!) 169/101 (BP Location: Right Arm)   Pulse 82   Temp 98.7 F (37.1 C) (Oral)   Resp 18   Ht 6\' 1"  (  1.854 m)   Wt 120.2 kg   SpO2 97%   BMI 34.96 kg/m   Physical Exam  Constitutional: He is oriented to person, place, and time. He appears well-developed and well-nourished. No distress.  HENT:  Head: Normocephalic and atraumatic.  Eyes: Conjunctivae are normal. Pupils are equal, round, and reactive to light. Right eye exhibits no discharge. Left eye exhibits no discharge. No scleral icterus.  Neck: Normal range of motion.  Cardiovascular: Normal rate.   Pulmonary/Chest: Effort normal. No respiratory distress.  Abdominal: He exhibits no distension.  Musculoskeletal:  Back: Inspection: No masses, deformity, or rash Palpation: Midline spinal tenderness with right sided paraspinal muscle tenderness and  flank tenderness Strength: 5/5 in lower extremities and normal plantar and dorsiflexion Sensation: Intact sensation with light touch in lower extremities bilaterally Gait: Antalgic gait with cane Reflexes: Patellar reflex is 2+ bilaterally SLR: Negative seated straight leg raise    Neurological: He is alert and oriented to person, place, and time.  Skin: Skin is warm and dry.  Psychiatric: He has a normal mood and affect. His behavior is normal.  Nursing note and vitals reviewed.    ED Treatments / Results  Labs (all labs ordered are listed, but only abnormal results are displayed) Labs Reviewed - No data to display  EKG  EKG Interpretation None       Radiology No results found.  Procedures Procedures (including critical care time)  Medications Ordered in ED Medications  dexamethasone (DECADRON) injection 10 mg (10 mg Intravenous Given 08/02/16 0948)     Initial Impression / Assessment and Plan / ED Course  I have reviewed the triage vital signs and the nursing notes.  Pertinent labs & imaging results that were available during my care of the patient were reviewed by me and considered in my medical decision making (see chart for details).  Clinical Course    60 year old male with back pain. He is hypertensive but otherwise vitals are WNL. Exam is more consistent with [facility] back pain although another consideration is shingles with his description of burning pain however currently there is no rash and pain has been ongoing for a week. Will treat with steroids, muscle relaxer,and Ultracet due to kidney function. Advised follow up with PCP and to return if he develops worsening symptoms or a rash.  Final Clinical Impressions(s) / ED Diagnoses   Final diagnoses:  Right low back pain, unspecified chronicity, with sciatica presence unspecified    New Prescriptions New Prescriptions   METHOCARBAMOL (ROBAXIN) 500 MG TABLET    Take 1 tablet (500 mg total) by mouth at  bedtime and may repeat dose one time if needed.   TRAMADOL-ACETAMINOPHEN (ULTRACET) 37.5-325 MG TABLET    Take 1 tablet by mouth every 6 (six) hours as needed.     Bethel BornKelly Marie Nabilah Davoli, PA-C 08/02/16 1029    Jerelyn ScottMartha Linker, MD 08/02/16 [phone]

## 2016-08-02 NOTE — ED Triage Notes (Signed)
Patient c/o R lower back burning pain that has grown worse over the past week.

## 2018-05-08 ENCOUNTER — Other Ambulatory Visit: Payer: Self-pay

## 2018-05-08 ENCOUNTER — Emergency Department (HOSPITAL_BASED_OUTPATIENT_CLINIC_OR_DEPARTMENT_OTHER)
Admission: EM | Admit: 2018-05-08 | Discharge: 2018-05-08 | Disposition: A | Discharge status: Home / Self Care | Payer: Medicare Other | Attending: Emergency Medicine | Admitting: Emergency Medicine

## 2018-05-08 ENCOUNTER — Emergency Department (HOSPITAL_BASED_OUTPATIENT_CLINIC_OR_DEPARTMENT_OTHER): Payer: Medicare Other

## 2018-05-08 ENCOUNTER — Encounter (HOSPITAL_BASED_OUTPATIENT_CLINIC_OR_DEPARTMENT_OTHER): Payer: Self-pay | Admitting: *Deleted

## 2018-05-08 DIAGNOSIS — Y939 Activity, unspecified: Secondary | ICD-10-CM | POA: Insufficient documentation

## 2018-05-08 DIAGNOSIS — Z79899 Other long term (current) drug therapy: Secondary | ICD-10-CM | POA: Insufficient documentation

## 2018-05-08 DIAGNOSIS — W010XXA Fall on same level from slipping, tripping and stumbling without subsequent striking against object, initial encounter: Secondary | ICD-10-CM | POA: Diagnosis not present

## 2018-05-08 DIAGNOSIS — Z7982 Long term (current) use of aspirin: Secondary | ICD-10-CM | POA: Insufficient documentation

## 2018-05-08 DIAGNOSIS — W19XXXA Unspecified fall, initial encounter: Secondary | ICD-10-CM

## 2018-05-08 DIAGNOSIS — Y999 Unspecified external cause status: Secondary | ICD-10-CM | POA: Insufficient documentation

## 2018-05-08 DIAGNOSIS — S4991XA Unspecified injury of right shoulder and upper arm, initial encounter: Secondary | ICD-10-CM | POA: Diagnosis present

## 2018-05-08 DIAGNOSIS — S46311A Strain of muscle, fascia and tendon of triceps, right arm, initial encounter: Secondary | ICD-10-CM | POA: Insufficient documentation

## 2018-05-08 DIAGNOSIS — I10 Essential (primary) hypertension: Secondary | ICD-10-CM | POA: Insufficient documentation

## 2018-05-08 DIAGNOSIS — Y929 Unspecified place or not applicable: Secondary | ICD-10-CM | POA: Diagnosis not present

## 2018-05-08 MED ORDER — TRAMADOL HCL 50 MG PO TABS: 50.0000 mg | ORAL_TABLET | Freq: Four times a day (QID) | ORAL | 0 refills | Status: DC | PRN

## 2018-05-08 MED ORDER — DEXAMETHASONE 6 MG PO TABS
6.0000 mg | ORAL_TABLET | Freq: Once | ORAL | Status: AC
  Administered 2018-05-08: 6 mg via ORAL
  Filled 2018-05-08: qty 1

## 2018-05-08 MED ORDER — KETOROLAC TROMETHAMINE 15 MG/ML IJ SOLN
15.0000 mg | Freq: Once | INTRAMUSCULAR | Status: AC
  Administered 2018-05-08: 15 mg via INTRAMUSCULAR
  Filled 2018-05-08: qty 1

## 2018-05-08 NOTE — ED Notes (Signed)
Pt verbalizes understanding of d/c instructions and denies any further needs at this time. 

## 2018-05-08 NOTE — ED Triage Notes (Signed)
Pt c/o fall from standing carrying a mattress injuring right shoulder  X 6 hrs ago

## 2018-05-17 NOTE — ED Provider Notes (Signed)
[facility] EMERGENCY DEPARTMENT Provider Note   CSN: [id number] Arrival date & time: 05/08/18  1916     History   Chief Complaint Chief Complaint  Patient presents with  . Fall    HPI Aaron Marquez is a 62 y.o. male.  HPI   62 year old male with right shoulder pain.  He was helping to carry a large mattress when he lost his balance and fell back striking his right shoulder.  He is having pain in the area of his right triceps.  Aches at rest.  Much worse with movement.  Denies any other injuries.  Past Medical History:  Diagnosis Date  . Back pain   . GERD (gastroesophageal reflux disease)   . Gout   . Hypertension   . Lumbar hernia   . Pneumonia 07/31/12    There are no active problems to display for this patient.   Past Surgical History:  Procedure Laterality Date  . BACK SURGERY    . FERTILITY SURGERY     stent  . LUMBAR LAMINECTOMY/DECOMPRESSION MICRODISCECTOMY Right 01/01/2013   Procedure: LUMBAR LAMINECTOMY/DECOMPRESSION MICRODISCECTOMY 1 LEVEL;  Surgeon: Mariam Dollar, MD;  Location: [facility];  Service: Neurosurgery;  Laterality: Right;  Lumbar Laminectomies Lumbar Four-Five, Decompression, Discectomy         Home Medications    Prior to Admission medications   Medication Sig Start Date End Date Taking? Authorizing Provider  aspirin 81 MG tablet Take 81 mg by mouth daily.    [provider]  atorvastatin (LIPITOR) 80 MG tablet Take 80 mg by mouth daily.    [provider]  colchicine 0.6 MG tablet Take 1 tablet (0.6 mg total) by mouth daily. Please take 1 tab twice today, 1 tab tomorrow, and 1 tab the following day 09/08/15   Hedges, Tinnie Gens, PA-C  indomethacin (INDOCIN) 25 MG capsule Take 1 capsule (25 mg total) by mouth 3 (three) times daily as needed for moderate pain. 05/09/16   Fayrene Helper, PA-C  methocarbamol (ROBAXIN) 500 MG tablet Take 1 tablet (500 mg total) by mouth at bedtime and may repeat dose one time if needed.  08/02/16   Bethel Born, PA-C  metoprolol (LOPRESSOR) 50 MG tablet Take 50 mg by mouth 2 (two) times daily.    [provider]  metoprolol succinate (TOPROL-XL) 50 MG 24 hr tablet Take 50 mg by mouth 2 (two) times daily. Take with or immediately following a meal.    [provider]  omeprazole (PRILOSEC) 20 MG capsule Take 20 mg by mouth 2 (two) times daily.    [provider]  predniSONE (DELTASONE) 20 MG tablet 3 tabs po day one, then 2 tabs daily x 4 days 05/09/16   Fayrene Helper, PA-C  traMADol (ULTRAM) 50 MG tablet Take 1 tablet (50 mg total) by mouth every 6 (six) hours as needed. 05/08/18   Raeford Razor, MD  traMADol-acetaminophen (ULTRACET) 37.5-325 MG tablet Take 1 tablet by mouth every 6 (six) hours as needed. 08/02/16   Bethel Born, PA-C    Family History History reviewed. No pertinent family history.  Social History Social History   Tobacco Use  . Smoking status: Never Smoker  . Smokeless tobacco: Never Used  Substance Use Topics  . Alcohol use: No  . Drug use: No     Allergies   Lisinopril   Review of Systems Review of Systems  All systems reviewed and negative, other than as noted in HPI.  Physical Exam Updated  Vital Signs BP (!) 134/99   Pulse 74   Temp 98.6 F (37 C)   Resp 16   Ht 5\' 8"  (1.727 m)   Wt 121.6 kg   SpO2 97%   BMI 40.75 kg/m   Physical Exam  Constitutional: He appears well-developed and well-nourished. No distress.  HENT:  Head: Normocephalic and atraumatic.  Eyes: Conjunctivae are normal. Right eye exhibits no discharge. Left eye exhibits no discharge.  Neck: Neck supple.  Cardiovascular: Normal rate, regular rhythm and normal heart sounds. Exam reveals no gallop and no friction rub.  No murmur heard. Pulmonary/Chest: Effort normal and breath sounds normal. No respiratory distress.  Abdominal: Soft. He exhibits no distension. There is no tenderness.  Musculoskeletal: He exhibits no edema or  tenderness.  Is to palpation in the mid right upper arm particularly over the triceps muscle.  He can actively range that although with sniffily increased pain.  Wrist injury.  Neurovascularly intact.  Neurological: He is alert.  Skin: Skin is warm and dry.  Psychiatric: He has a normal mood and affect. His behavior is normal. Thought content normal.  Nursing note and vitals reviewed.    ED Treatments / Results  Labs (all labs ordered are listed, but only abnormal results are displayed) Labs Reviewed - No data to display  EKG None  Radiology No results found.  Procedures Procedures (including critical care time)  Medications Ordered in ED Medications  ketorolac (TORADOL) 15 MG/ML injection 15 mg (15 mg Intramuscular Given 05/08/18 2216)  dexamethasone (DECADRON) tablet 6 mg (6 mg Oral Given 05/08/18 2215)     Initial Impression / Assessment and Plan / ED Course  I have reviewed the triage vital signs and the nursing notes.  Pertinent labs & imaging results that were available during my care of the patient were reviewed by me and considered in my medical decision making (see chart for details).     62 year old male with pain in his right upper arm.  Suspect triceps contusion or strain.  Negative imaging.  Neurovascular intact.  Systematic treatment.  Return precautions discussed.  Final Clinical Impressions(s) / ED Diagnoses   Final diagnoses:  Strain of right triceps, initial encounter    ED Discharge Orders         Ordered    traMADol (ULTRAM) 50 MG tablet  Every 6 hours PRN     05/08/18 2211           Raeford Razor, MD 05/17/18 2350

## 2018-05-31 ENCOUNTER — Emergency Department (HOSPITAL_BASED_OUTPATIENT_CLINIC_OR_DEPARTMENT_OTHER)
Admission: EM | Admit: 2018-05-31 | Discharge: 2018-06-01 | Disposition: A | Discharge status: Home / Self Care | Payer: Medicare Other | Attending: Emergency Medicine | Admitting: Emergency Medicine

## 2018-05-31 ENCOUNTER — Encounter (HOSPITAL_BASED_OUTPATIENT_CLINIC_OR_DEPARTMENT_OTHER): Payer: Self-pay | Admitting: Emergency Medicine

## 2018-05-31 ENCOUNTER — Other Ambulatory Visit: Payer: Self-pay

## 2018-05-31 DIAGNOSIS — R109 Unspecified abdominal pain: Secondary | ICD-10-CM | POA: Diagnosis not present

## 2018-05-31 DIAGNOSIS — M62830 Muscle spasm of back: Secondary | ICD-10-CM | POA: Insufficient documentation

## 2018-05-31 DIAGNOSIS — I10 Essential (primary) hypertension: Secondary | ICD-10-CM | POA: Insufficient documentation

## 2018-05-31 DIAGNOSIS — Z79899 Other long term (current) drug therapy: Secondary | ICD-10-CM | POA: Insufficient documentation

## 2018-05-31 DIAGNOSIS — N12 Tubulo-interstitial nephritis, not specified as acute or chronic: Secondary | ICD-10-CM | POA: Diagnosis not present

## 2018-05-31 DIAGNOSIS — Z7982 Long term (current) use of aspirin: Secondary | ICD-10-CM | POA: Diagnosis not present

## 2018-05-31 NOTE — ED Triage Notes (Signed)
Pt c/o 10/10 right side flank pain, getting worse tonight. Pt denies any urinary symptoms states he fell backwards last week, but now he feels this stabbing pain.

## 2018-06-01 ENCOUNTER — Emergency Department (HOSPITAL_BASED_OUTPATIENT_CLINIC_OR_DEPARTMENT_OTHER): Payer: Medicare Other

## 2018-06-01 LAB — CBC WITH DIFFERENTIAL/PLATELET
ABS IMMATURE GRANULOCYTES: 0.01 10*3/uL (ref 0.00–0.07)
BASOS PCT: 0 %
Basophils Absolute: 0 10*3/uL (ref 0.0–0.1)
EOS ABS: 0.1 10*3/uL (ref 0.0–0.5)
Eosinophils Relative: 2 %
HCT: 44.7 % (ref 39.0–52.0)
Hemoglobin: 14.5 g/dL (ref 13.0–17.0)
Immature Granulocytes: 0 %
LYMPHS ABS: 1.7 10*3/uL (ref 0.7–4.0)
LYMPHS PCT: 25 %
MCH: 26.1 pg (ref 26.0–34.0)
MCHC: 32.4 g/dL (ref 30.0–36.0)
MCV: 80.5 fL (ref 80.0–100.0)
MONO ABS: 0.7 10*3/uL (ref 0.1–1.0)
MONOS PCT: 10 %
NEUTROS ABS: 4.3 10*3/uL (ref 1.7–7.7)
Neutrophils Relative %: 63 %
Platelets: 166 10*3/uL (ref 150–400)
RBC: 5.55 MIL/uL (ref 4.22–5.81)
RDW: 13.2 % (ref 11.5–15.5)
WBC: 6.8 10*3/uL (ref 4.0–10.5)
nRBC: 0 % (ref 0.0–0.2)

## 2018-06-01 LAB — BASIC METABOLIC PANEL
Anion gap: 10 (ref 5–15)
BUN: 17 mg/dL (ref 8–23)
CALCIUM: 9.1 mg/dL (ref 8.9–10.3)
CO2: 24 mmol/L (ref 22–32)
CREATININE: 1.8 mg/dL — AB (ref 0.61–1.24)
Chloride: 106 mmol/L (ref 98–111)
GFR calc Af Amer: 45 mL/min — ABNORMAL LOW (ref >60)
GFR, EST NON AFRICAN AMERICAN: 39 mL/min — AB (ref >60)
GLUCOSE: 93 mg/dL (ref 70–99)
Potassium: 3.4 mmol/L — ABNORMAL LOW (ref 3.5–5.1)
SODIUM: 140 mmol/L (ref 135–145)

## 2018-06-01 LAB — URINALYSIS, ROUTINE W REFLEX MICROSCOPIC
BILIRUBIN URINE: NEGATIVE
GLUCOSE, UA: NEGATIVE mg/dL
Hgb urine dipstick: NEGATIVE
Ketones, ur: NEGATIVE mg/dL
Leukocytes, UA: NEGATIVE
Nitrite: NEGATIVE
PROTEIN: NEGATIVE mg/dL
Specific Gravity, Urine: 1.02 (ref 1.005–1.030)
pH: 6 (ref 5.0–8.0)

## 2018-06-01 MED ORDER — ONDANSETRON HCL 4 MG/2ML IJ SOLN
4.0000 mg | Freq: Once | INTRAMUSCULAR | Status: AC
  Administered 2018-06-01: 4 mg via INTRAVENOUS
  Filled 2018-06-01: qty 2

## 2018-06-01 MED ORDER — FENTANYL CITRATE (PF) 100 MCG/2ML IJ SOLN
100.0000 ug | Freq: Once | INTRAMUSCULAR | Status: AC
Start: 2018-06-01 — End: 2018-06-01
  Administered 2018-06-01: 100 ug via INTRAVENOUS
  Filled 2018-06-01: qty 2

## 2018-06-01 NOTE — ED Provider Notes (Signed)
[facility] EMERGENCY DEPARTMENT Provider Note   CSN: [id number] Arrival date & time: 05/31/18  2327     History   Chief Complaint Chief Complaint  Patient presents with  . Flank Pain    HPI Aaron Marquez is a 61 y.o. male.  The history is provided by the patient.  Flank Pain  This is a new problem. The current episode started 6 to 12 hours ago. The problem occurs constantly. The problem has been gradually worsening. Pertinent negatives include no chest pain, no abdominal pain and no shortness of breath. The symptoms are aggravated by bending (movement). The symptoms are relieved by rest. He has tried nothing for the symptoms.  Patient reports onset of right-sided flank/back pain several hours ago.  It is getting worse.  No falls in the past 24 hours, but he did fall earlier this month.  The pain does not radiate.  No abdominal pain.  No dysuria.  No leg weakness.  No difficulty urinating reported.  Past Medical History:  Diagnosis Date  . Back pain   . GERD (gastroesophageal reflux disease)   . Gout   . Hypertension   . Lumbar hernia   . Pneumonia 07/31/12    There are no active problems to display for this patient.   Past Surgical History:  Procedure Laterality Date  . BACK SURGERY    . FERTILITY SURGERY     stent  . LUMBAR LAMINECTOMY/DECOMPRESSION MICRODISCECTOMY Right 01/01/2013   Procedure: LUMBAR LAMINECTOMY/DECOMPRESSION MICRODISCECTOMY 1 LEVEL;  Surgeon: Mariam Dollar, MD;  Location: [facility];  Service: Neurosurgery;  Laterality: Right;  Lumbar Laminectomies Lumbar Four-Five, Decompression, Discectomy         Home Medications    Prior to Admission medications   Medication Sig Start Date End Date Taking? Authorizing Provider  aspirin 81 MG tablet Take 81 mg by mouth daily.    [provider]  atorvastatin (LIPITOR) 80 MG tablet Take 80 mg by mouth daily.    [provider]  metoprolol (LOPRESSOR) 50 MG tablet Take 50 mg by  mouth 2 (two) times daily.    [provider]  metoprolol succinate (TOPROL-XL) 50 MG 24 hr tablet Take 50 mg by mouth 2 (two) times daily. Take with or immediately following a meal.    [provider]  omeprazole (PRILOSEC) 20 MG capsule Take 20 mg by mouth 2 (two) times daily.    [provider]  traMADol (ULTRAM) 50 MG tablet Take 1 tablet (50 mg total) by mouth every 6 (six) hours as needed. 05/08/18   Raeford Razor, MD    Family History No family history on file.  Social History Social History   Tobacco Use  . Smoking status: Never Smoker  . Smokeless tobacco: Never Used  Substance Use Topics  . Alcohol use: No  . Drug use: No     Allergies   Lisinopril   Review of Systems Review of Systems  Constitutional: Negative for fever.  Respiratory: Negative for shortness of breath.   Cardiovascular: Negative for chest pain.  Gastrointestinal: Negative for abdominal pain.  Genitourinary: Positive for flank pain. Negative for difficulty urinating and dysuria.  Neurological: Negative for weakness.  All other systems reviewed and are negative.    Physical Exam Updated Vital Signs BP 140/73 (BP Location: Right Arm)   Pulse 79   Temp 98.3 F (36.8 C) (Oral)   Resp 18   Ht 1.727 m (5\' 8" )   Wt 121.6 kg  SpO2 97%   BMI 40.75 kg/m   Physical Exam  CONSTITUTIONAL: Well developed/well nourished, uncomfortable appearing HEAD: Normocephalic/atraumatic EYES: EOMI/PERRL ENMT: Mucous membranes moist NECK: supple no meningeal signs SPINE/BACK:entire spine nontender CV: S1/S2 noted, no murmurs/rubs/gallops noted LUNGS: Lungs are clear to auscultation bilaterally, no apparent distress ABDOMEN: soft, nontender, no rebound or guarding, bowel sounds noted throughout abdomen RU:EAVWU cva tenderness NEURO: Pt is awake/alert/appropriate, moves all extremitiesx4.  No facial droop.  He can ambulate.  He has full strength noted bilateral lower extremities,  equal power noted bilateral lower extremity EXTREMITIES: pulses normal/equal, full ROM SKIN: warm, color normal PSYCH: no abnormalities of mood noted, alert and oriented to situation  ED Treatments / Results  Labs (all labs ordered are listed, but only abnormal results are displayed) Labs Reviewed  BASIC METABOLIC PANEL - Abnormal; Notable for the following components:      Result Value   Potassium 3.4 (*)    Creatinine, Ser 1.80 (*)    GFR calc non Af Amer 39 (*)    GFR calc Af Amer 45 (*)    All other components within normal limits  CBC WITH DIFFERENTIAL/PLATELET  URINALYSIS, ROUTINE W REFLEX MICROSCOPIC    EKG None  Radiology Ct Renal Stone Study  Result Date: 06/01/2018 CLINICAL DATA:  RIGHT flank pain for 2 days. History of back pain, lumbar spine surgery and lumbar hernia. EXAM: CT ABDOMEN AND PELVIS WITHOUT CONTRAST TECHNIQUE: Multidetector CT imaging of the abdomen and pelvis was performed following the standard protocol without IV contrast. COMPARISON:  CT abdomen and pelvis November 10, 2012 FINDINGS: LOWER CHEST: Lung bases are clear. The visualized heart size is normal. No pericardial effusion. HEPATOBILIARY: Normal. PANCREAS: Normal. SPLEEN: Normal. ADRENALS/URINARY TRACT: Kidneys are orthotopic, demonstrating normal size and morphology. No nephrolithiasis, hydronephrosis; limited assessment for renal masses by nonenhanced CT. 3.1 cm homogeneously hypodense benign-appearing cyst lower pole LEFT kidney. The unopacified ureters are normal in course and caliber. Urinary bladder is partially distended and unremarkable. Normal adrenal glands. STOMACH/BOWEL: The stomach, small and large bowel are normal in course and caliber without inflammatory changes, sensitivity decreased by lack of enteric contrast. Similar wide to necked RIGHT inguinal hernia containing loops of small bowel without inflammatory changes or fluid within the hernia sac. Mild colonic diverticulosis. Normal appendix.  VASCULAR/LYMPHATIC: Aortoiliac vessels are normal in course and caliber. Minimal calcific atherosclerosis. No lymphadenopathy by CT size criteria. REPRODUCTIVE: Normal. OTHER: No intraperitoneal free fluid or free air. MUSCULOSKELETAL: Non-acute. 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 IMPRESSION: 1. No nephrolithiasis, hydronephrosis or acute intra-abdominal/pelvic process. 2. Similar RIGHT inguinal hernia containing small bowel without CT findings of strangulation. No bowel obstruction. Aortic Atherosclerosis (ICD10-I70.0). Electronically Signed   By: Awilda Metro M.D.   On: 06/01/2018 01:46    Procedures Procedures    Medications Ordered in ED Medications  fentaNYL (SUBLIMAZE) injection 100 mcg (100 mcg Intravenous Given 06/01/18 0048)  ondansetron (ZOFRAN) injection 4 mg (4 mg Intravenous Given 06/01/18 0048)     Initial Impression / Assessment and Plan / ED Course  I have reviewed the triage vital signs and the nursing notes.  Pertinent labs results that were available during my care of the patient were reviewed by me and considered in my medical decision making (see chart for details).    2:08 AM Patient presented for flank pain of unclear etiology.  Ureteral colic was a possibility, therefore CT imaging was performed.  CT imaging negative for acute abdominal emergency.  Patient is  improved.  No neuro deficits and he can ambulate.  Will discharge home.  Narcotic database reviewed and considered in decision making Pt already has pain medications at home.  He reports he also has muscle relaxers at home, advised to use these as well as heating pad.  We discussed strict return precautions Final Clinical Impressions(s) / ED Diagnoses   Final diagnoses:  Flank pain  Muscle spasm of back  Pyelonephritis    ED Discharge Orders    None       Zadie Rhine, MD 06/01/18 [phone]

## 2018-06-01 NOTE — Discharge Instructions (Addendum)
There are no signs of any kidney stones.  No signs of any major medical problems inside your abdomen.  I suspect this is a muscle strain.  You can use the muscle relaxers you have at home, as well as a heating pad.   SEEK IMMEDIATE MEDICAL ATTENTION IF: New numbness, tingling, weakness, or problem with the use of your arms or legs.  Severe back pain not relieved with medications.  Change in bowel or bladder control (if you lose control of stool or urine, or if you are unable to urinate) Increasing pain in any areas of the body (such as chest or abdominal pain).  Shortness of breath, dizziness or fainting.  Nausea (feeling sick to your stomach), vomiting, fever, or sweats.

## 2019-01-07 ENCOUNTER — Other Ambulatory Visit: Payer: Self-pay | Admitting: Neurosurgery

## 2019-01-07 DIAGNOSIS — M5440 Lumbago with sciatica, unspecified side: Secondary | ICD-10-CM

## 2019-01-30 ENCOUNTER — Ambulatory Visit
Admission: RE | Admit: 2019-01-30 | Discharge: 2019-01-30 | Disposition: A | Discharge status: Home / Self Care | Payer: Medicare Other | Source: Ambulatory Visit | Attending: Neurosurgery | Admitting: Neurosurgery

## 2019-01-30 DIAGNOSIS — M5440 Lumbago with sciatica, unspecified side: Secondary | ICD-10-CM

## 2019-01-30 MED ORDER — GADOBENATE DIMEGLUMINE 529 MG/ML IV SOLN
10.0000 mL | Freq: Once | INTRAVENOUS | Status: AC | PRN
  Administered 2019-01-30: 10 mL via INTRAVENOUS

## 2019-07-31 ENCOUNTER — Other Ambulatory Visit: Payer: Self-pay

## 2019-07-31 ENCOUNTER — Emergency Department (HOSPITAL_COMMUNITY): Payer: Medicare Other

## 2019-07-31 ENCOUNTER — Emergency Department (HOSPITAL_COMMUNITY)
Admission: EM | Admit: 2019-07-31 | Discharge: 2019-07-31 | Disposition: A | Discharge status: Home / Self Care | Payer: Medicare Other | Attending: Emergency Medicine | Admitting: Emergency Medicine

## 2019-07-31 ENCOUNTER — Encounter (HOSPITAL_COMMUNITY): Payer: Self-pay | Admitting: Emergency Medicine

## 2019-07-31 DIAGNOSIS — Y929 Unspecified place or not applicable: Secondary | ICD-10-CM | POA: Insufficient documentation

## 2019-07-31 DIAGNOSIS — M545 Low back pain: Secondary | ICD-10-CM | POA: Diagnosis not present

## 2019-07-31 DIAGNOSIS — S161XXA Strain of muscle, fascia and tendon at neck level, initial encounter: Secondary | ICD-10-CM

## 2019-07-31 DIAGNOSIS — T07XXXA Unspecified multiple injuries, initial encounter: Secondary | ICD-10-CM | POA: Diagnosis present

## 2019-07-31 DIAGNOSIS — Y999 Unspecified external cause status: Secondary | ICD-10-CM | POA: Diagnosis not present

## 2019-07-31 DIAGNOSIS — I10 Essential (primary) hypertension: Secondary | ICD-10-CM | POA: Insufficient documentation

## 2019-07-31 DIAGNOSIS — R55 Syncope and collapse: Secondary | ICD-10-CM | POA: Insufficient documentation

## 2019-07-31 DIAGNOSIS — Y939 Activity, unspecified: Secondary | ICD-10-CM | POA: Diagnosis not present

## 2019-07-31 DIAGNOSIS — R519 Headache, unspecified: Secondary | ICD-10-CM | POA: Diagnosis not present

## 2019-07-31 DIAGNOSIS — Z7982 Long term (current) use of aspirin: Secondary | ICD-10-CM | POA: Diagnosis not present

## 2019-07-31 DIAGNOSIS — Z79899 Other long term (current) drug therapy: Secondary | ICD-10-CM | POA: Diagnosis not present

## 2019-07-31 DIAGNOSIS — S46912A Strain of unspecified muscle, fascia and tendon at shoulder and upper arm level, left arm, initial encounter: Secondary | ICD-10-CM | POA: Diagnosis not present

## 2019-07-31 MED ORDER — OXYCODONE-ACETAMINOPHEN 5-325 MG PO TABS
1.0000 | ORAL_TABLET | ORAL | Status: DC | PRN
  Administered 2019-07-31: 21:00:00 1 via ORAL
  Filled 2019-07-31: qty 1

## 2019-07-31 MED ORDER — METHOCARBAMOL 500 MG PO TABS
500.0000 mg | ORAL_TABLET | Freq: Once | ORAL | Status: AC
  Administered 2019-07-31: 500 mg via ORAL
  Filled 2019-07-31: qty 1

## 2019-07-31 MED ORDER — METHOCARBAMOL 500 MG PO TABS: 500.0000 mg | ORAL_TABLET | Freq: Two times a day (BID) | ORAL | 0 refills | Status: DC

## 2019-07-31 NOTE — ED Notes (Signed)
Patient verbalizes understanding of discharge instructions. Opportunity for questioning and answers were provided. Armband removed by staff, pt discharged from ED ambulatory.   
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
## 2019-11-08 ENCOUNTER — Ambulatory Visit: Payer: Medicare Other | Attending: Internal Medicine

## 2019-11-08 DIAGNOSIS — Z23 Encounter for immunization: Secondary | ICD-10-CM

## 2019-11-08 NOTE — Progress Notes (Signed)
   Covid-19 Vaccination Clinic  Name:  Jakori Burkett    MRN: [id number] DOB: Jun 06, 1956  11/08/2019  Mr. Sagan was observed post Covid-19 immunization for 15 minutes without incident. He was provided with Vaccine Information Sheet and instruction to access the V-Safe system.   Mr. Kaufhold was instructed to call 911 with any severe reactions post vaccine: Marland Kitchen Difficulty breathing  . Swelling of face and throat  . A fast heartbeat  . A bad rash all over body  . Dizziness and weakness   Immunizations Administered    Name Date Dose VIS Date Route   Moderna COVID-19 Vaccine 11/08/2019 10:57 AM 0.5 mL 07/08/2019 Intramuscular   Manufacturer: Moderna   Lot: [id number]   NDC: 45859-292-44

## 2019-12-06 ENCOUNTER — Ambulatory Visit: Payer: Medicare Other | Attending: Internal Medicine

## 2019-12-06 DIAGNOSIS — Z23 Encounter for immunization: Secondary | ICD-10-CM

## 2019-12-06 NOTE — Progress Notes (Signed)
   Covid-19 Vaccination Clinic  Name:  Aaron Marquez    MRN: [id number] DOB: June 01, 1956  12/06/2019  Mr. Aaron Marquez was observed post Covid-19 immunization for 15 minutes without incident. He was provided with Vaccine Information Sheet and instruction to access the V-Safe system.   Mr. Aaron Marquez was instructed to call 911 with any severe reactions post vaccine: Marland Kitchen Difficulty breathing  . Swelling of face and throat  . A fast heartbeat  . A bad rash all over body  . Dizziness and weakness   Immunizations Administered    Name Date Dose VIS Date Route   Moderna COVID-19 Vaccine 12/06/2019 10:58 AM 0.5 mL 07/2019 Intramuscular   Manufacturer: Moderna   Lot: [id number]   NDC: 50932-671-24

## 2020-03-08 ENCOUNTER — Encounter: Payer: Self-pay | Admitting: Physical Therapy

## 2020-03-08 ENCOUNTER — Other Ambulatory Visit: Payer: Self-pay

## 2020-03-08 ENCOUNTER — Ambulatory Visit: Payer: Medicare Other | Attending: Neurosurgery | Admitting: Physical Therapy

## 2020-03-08 DIAGNOSIS — M542 Cervicalgia: Secondary | ICD-10-CM | POA: Diagnosis not present

## 2020-03-08 DIAGNOSIS — M545 Low back pain, unspecified: Secondary | ICD-10-CM

## 2020-03-08 DIAGNOSIS — R29898 Other symptoms and signs involving the musculoskeletal system: Secondary | ICD-10-CM | POA: Diagnosis present

## 2020-03-08 DIAGNOSIS — G8929 Other chronic pain: Secondary | ICD-10-CM | POA: Diagnosis present

## 2020-03-08 DIAGNOSIS — M62838 Other muscle spasm: Secondary | ICD-10-CM | POA: Diagnosis present
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
## 2020-03-15 ENCOUNTER — Ambulatory Visit: Payer: Medicare Other | Admitting: Physical Therapy

## 2020-03-22 ENCOUNTER — Ambulatory Visit: Payer: Medicare Other | Admitting: Physical Therapy

## 2020-03-22 ENCOUNTER — Encounter: Payer: Self-pay | Admitting: Physical Therapy

## 2020-03-22 ENCOUNTER — Other Ambulatory Visit: Payer: Self-pay

## 2020-03-22 DIAGNOSIS — G8929 Other chronic pain: Secondary | ICD-10-CM

## 2020-03-22 DIAGNOSIS — R29898 Other symptoms and signs involving the musculoskeletal system: Secondary | ICD-10-CM

## 2020-03-22 DIAGNOSIS — M62838 Other muscle spasm: Secondary | ICD-10-CM

## 2020-03-22 DIAGNOSIS — M542 Cervicalgia: Secondary | ICD-10-CM

## 2020-03-22 NOTE — Therapy (Signed)
[facility] Outpatient Rehabilitation [facility] [address]  [address] [place], [place], [address] Phone: [phone]   Fax:  [phone]  Physical Therapy Treatment  Patient Details  Name: Aaron Marquez MRN: [id number] Date of Birth: 06-16-1956 Referring Provider (PT): Donalee Citrin, MD   Encounter Date: 03/22/2020   PT End of Session - 03/22/20 0930    Visit Number 2    Number of Visits 7    Date for PT Re-Evaluation 04/19/20    Authorization Type AARP    PT Start Time 0900   pt late   PT Stop Time 0928    PT Time Calculation (min) 28 min    Activity Tolerance Patient tolerated treatment well;Patient limited by pain    Behavior During Therapy [facility] for tasks assessed/performed           Past Medical History:  Diagnosis Date   Back pain    GERD (gastroesophageal reflux disease)    Gout    Hypertension    Lumbar hernia    Pneumonia 07/31/12    Past Surgical History:  Procedure Laterality Date   BACK SURGERY     FERTILITY SURGERY     stent   LUMBAR LAMINECTOMY/DECOMPRESSION MICRODISCECTOMY Right 01/01/2013   Procedure: LUMBAR LAMINECTOMY/DECOMPRESSION MICRODISCECTOMY 1 LEVEL;  Surgeon: Mariam Dollar, MD;  Location: [facility];  Service: Neurosurgery;  Laterality: Right;  Lumbar Laminectomies Lumbar Four-Five, Decompression, Discectomy     There were no vitals filed for this visit.   Subjective Assessment - 03/22/20 0901    Subjective Has tried to perform his HEP- has trouble finding the motivation to do them.    Pertinent History lumbar hernia, HTN, gout, GERD, back pain, L4/5 laminectomy/discectomy 2014    Diagnostic tests none recent    Patient Stated Goals get rid of pain    Currently in Pain? Yes    Pain Score 0-No pain    Pain Score 9    Pain Location Neck    Pain Orientation Left    Pain Descriptors / Indicators Sharp    Pain Type Chronic pain                             [facility] Adult PT Treatment/Exercise -  03/22/20 0001      Exercises   Exercises Lumbar;Neck      Lumbar Exercises: Stretches   Lower Trunk Rotation Limitations x20 to tolerance   cues to avoid pushing into pain      Lumbar Exercises: Aerobic   Nustep L3 x 5 min (UEs/LEs)      Lumbar Exercises: Supine   Bridge 10 reps    Bridge Limitations cues to avoid valsalva      Lumbar Exercises: Sidelying   Other Sidelying Lumbar Exercises R/L open book stretch 10x each side   cueing to correct form     Lumbar Exercises: Quadruped   Madcat/Old Horse 10 reps    Madcat/Old Horse Limitations manual cueing for proper pelvic movement      Neck Exercises: Stretches   Upper Trapezius Stretch Right;Left;1 rep;30 seconds    Upper Trapezius Stretch Limitations holding onto mat; cueing to perform to tolerance    Levator Stretch Right;Left;1 rep;30 seconds    Levator Stretch Limitations holding onto mat; cueing to perform to tolerance                    PT Short Term Goals -  03/22/20 0939      PT SHORT TERM GOAL #1   Title Patient to be independent with initial HEP.    Time 3    Period Weeks    Status On-going    Target Date 03/29/20             PT Long Term Goals - 03/22/20 1031      PT LONG TERM GOAL #1   Title Patient to be independent with advanced HEP.    Time 6    Period Weeks    Status On-going      PT LONG TERM GOAL #2   Title Patient to demonstrate B LE strength >/=4+/5.    Time 6    Period Weeks    Status On-going      PT LONG TERM GOAL #3   Title Patient to demonstrate cervical AROM WFL and without pain limiting.    Time 6    Period Weeks    Status On-going      PT LONG TERM GOAL #4   Title Patient to demonstrate lumbar AROM WFL and without pain limiting.    Time 6    Period Weeks    Status On-going      PT LONG TERM GOAL #5   Title Patient to report 65% improvement in neck pain in AM.    Time 6    Period Weeks    Status On-going                 Plan - 03/22/20 0930     Clinical Impression Statement Patient reporting no particular issues with HEP besides trouble finding the motivation to perform them. Educated patient on importance of consistent compliance with HEP. Worked on reviewing HEP for max carryover, with cueing to avoid pushing into pain. Patient also tolerated initiation of bridge with cueing to avoid using Valsalva maneuver. Cueing required to correct hand position and form with open book stretch and demonstrated decreased mobility to B directions. Patient with most difficulty performing cat/cow stretch d/t difficulty coordinating movement and lack of lumbopelvic ROM. Also with tendency to sit with rounded posture with cervical stretching, requiring cueing to correct. Patient overall with good tolerance for ther-ex performed today, but with consistent cueing required to avoid pushing into pain. Patient without complaints at end of session.    Comorbidities lumbar hernia, HTN, gout, GERD, back pain, L4/5 laminectomy/discectomy 2014    PT Treatment/Interventions ADLs/Self Care Home Management;Cryotherapy;Electrical Stimulation;Moist Heat;Traction;Therapeutic exercise;Therapeutic activities;Functional mobility training;Stair training;Gait training;Ultrasound;Neuromuscular re-education;Patient/family education;Manual techniques;Taping;Energy conservation;Dry needling;Passive range of motion    PT Next Visit Plan progress spinal mobility and stretching    Consulted and Agree with Plan of Care Patient           Patient will benefit from skilled therapeutic intervention in order to improve the following deficits and impairments:  Hypomobility, Decreased activity tolerance, Decreased strength, Pain, Increased fascial restricitons, Difficulty walking, Increased muscle spasms, Improper body mechanics, Decreased range of motion, Impaired flexibility, Postural dysfunction  Visit Diagnosis: Cervicalgia  Chronic left-sided low back pain, unspecified whether sciatica  present  Other muscle spasm  Other symptoms and signs involving the musculoskeletal system     Problem List There are no problems to display for this patient.   Anette Guarneri, PT, DPT 03/22/20 10:33 AM   [facility] [address]  [address] [place], [place], [address] Phone: [phone]   Fax:  [phone]  Name: Aaron Marquez MRN: [id number]  Date of Birth: [birth year]

## 2020-03-29 ENCOUNTER — Other Ambulatory Visit: Payer: Self-pay

## 2020-03-29 ENCOUNTER — Ambulatory Visit: Payer: Medicare Other

## 2020-03-29 DIAGNOSIS — M542 Cervicalgia: Secondary | ICD-10-CM | POA: Diagnosis not present

## 2020-03-29 DIAGNOSIS — M62838 Other muscle spasm: Secondary | ICD-10-CM

## 2020-03-29 DIAGNOSIS — R29898 Other symptoms and signs involving the musculoskeletal system: Secondary | ICD-10-CM

## 2020-03-29 DIAGNOSIS — M545 Low back pain, unspecified: Secondary | ICD-10-CM

## 2020-03-29 NOTE — Therapy (Signed)
[facility] Outpatient Rehabilitation [facility] [address]  [address] [place], [place], [address] Phone: [phone]   Fax:  [phone]  Physical Therapy Treatment  Patient Details  Name: Aaron Marquez MRN: [id number] Date of Birth: 07-27-1956 Referring Provider (PT): Donalee Citrin, MD   Encounter Date: 03/29/2020   PT End of Session - 03/29/20 0855    Visit Number 3    Number of Visits 7    Date for PT Re-Evaluation 04/19/20    Authorization Type AARP    PT Start Time [phone]    PT Stop Time 0946    PT Time Calculation (min) 54 min    Activity Tolerance Patient tolerated treatment well;Patient limited by pain    Behavior During Therapy [facility] for tasks assessed/performed           Past Medical History:  Diagnosis Date  . Back pain   . GERD (gastroesophageal reflux disease)   . Gout   . Hypertension   . Lumbar hernia   . Pneumonia 07/31/12    Past Surgical History:  Procedure Laterality Date  . BACK SURGERY    . FERTILITY SURGERY     stent  . LUMBAR LAMINECTOMY/DECOMPRESSION MICRODISCECTOMY Right 01/01/2013   Procedure: LUMBAR LAMINECTOMY/DECOMPRESSION MICRODISCECTOMY 1 LEVEL;  Surgeon: Mariam Dollar, MD;  Location: [facility];  Service: Neurosurgery;  Laterality: Right;  Lumbar Laminectomies Lumbar Four-Five, Decompression, Discectomy     There were no vitals filed for this visit.   Subjective Assessment - 03/29/20 0856    Subjective Pt reports he has been doing his HEP 1-2x/day now.    Pertinent History lumbar hernia, HTN, gout, GERD, back pain, L4/5 laminectomy/discectomy 2014    Diagnostic tests none recent    Patient Stated Goals get rid of pain    Currently in Pain? Yes    Pain Score 8    7.5-8   Pain Location Neck    Pain Orientation Left    Pain Descriptors / Indicators Sharp    Pain Radiating Towards to L medial scap                             [facility] Adult PT Treatment/Exercise - 03/29/20 0001      Neck Exercises:  Seated   Neck Retraction 15 reps   inhale back, exhale forward   Neck Retraction Limitations with T/S ext over chair, HBH      Neck Exercises: Supine   Other Supine Exercise SO release on FR in H/L with pec S 3'   PT added manual pressure down onto forehead c c/s rot'n   Other Supine Exercise FR T/S Ext in H/L HBH x 10      Neck Exercises: Prone   Shoulder Extension 10 reps   3 seconds   Shoulder Extension Weights (lbs) palms facing in   manual A from PT for shoulder retract/depress   Shoulder Extension Limitations head resting in face hole      Lumbar Exercises: Aerobic   Nustep L6 x 5 min (UE/LE)      Lumbar Exercises: Sidelying   Other Sidelying Lumbar Exercises R/L open book stretch 10x each side   lumbar lock by holding top leg, HBH     Modalities   Modalities Moist Heat      Moist Heat Therapy   Number Minutes Moist Heat 10 Minutes    Moist Heat Location Cervical   H/L with bolster  PT Short Term Goals - 03/22/20 0939      PT SHORT TERM GOAL #1   Title Patient to be independent with initial HEP.    Time 3    Period Weeks    Status On-going    Target Date 03/29/20             PT Long Term Goals - 03/22/20 1031      PT LONG TERM GOAL #1   Title Patient to be independent with advanced HEP.    Time 6    Period Weeks    Status On-going      PT LONG TERM GOAL #2   Title Patient to demonstrate B LE strength >/=4+/5.    Time 6    Period Weeks    Status On-going      PT LONG TERM GOAL #3   Title Patient to demonstrate cervical AROM WFL and without pain limiting.    Time 6    Period Weeks    Status On-going      PT LONG TERM GOAL #4   Title Patient to demonstrate lumbar AROM WFL and without pain limiting.    Time 6    Period Weeks    Status On-going      PT LONG TERM GOAL #5   Title Patient to report 65% improvement in neck pain in AM.    Time 6    Period Weeks    Status On-going                 Plan - 03/29/20  0932    Clinical Impression Statement Pt is making gradual progress toward decrease in pain and increased knowledge base for posture and HEP. He has been more diligent with HEP since his last visit, reporting 1-2x/day performance. Pt sits throughout day for job and was educated on importance of cervical alignment and thoracic mobility to decrease pressure on neck. Pt tolerated tx well today, adding in seated T/S ext over chair and supine over FR, as well as prone I's for strengthening at conclusion of session. Pt had significant difficulty with prone I's 2 weakness in postural stabilizers. Concluded session with heat to neck in supine with pt reporting, "I am so glad I came here today. I really needed this and feel so much better".    Comorbidities lumbar hernia, HTN, gout, GERD, back pain, L4/5 laminectomy/discectomy 2014    PT Treatment/Interventions ADLs/Self Care Home Management;Cryotherapy;Electrical Stimulation;Moist Heat;Traction;Therapeutic exercise;Therapeutic activities;Functional mobility training;Stair training;Gait training;Ultrasound;Neuromuscular re-education;Patient/family education;Manual techniques;Taping;Energy conservation;Dry needling;Passive range of motion    PT Next Visit Plan progress spinal mobility and stretching, postural alignment and stabilization, cervical posturing, foam roller spinal mobility, edu for lumbar/cervical roll, trial FDN    Consulted and Agree with Plan of Care Patient           Patient will benefit from skilled therapeutic intervention in order to improve the following deficits and impairments:  Hypomobility, Decreased activity tolerance, Decreased strength, Pain, Increased fascial restricitons, Difficulty walking, Increased muscle spasms, Improper body mechanics, Decreased range of motion, Impaired flexibility, Postural dysfunction  Visit Diagnosis: Cervicalgia  Chronic left-sided low back pain, unspecified whether sciatica present  Other muscle  spasm  Other symptoms and signs involving the musculoskeletal system     Problem List There are no problems to display for this patient.   Marcelline Mates, PT, DPT 03/29/2020, 10:02 AM  [facility] Outpatient Rehabilitation [facility] [address]  [address] [place], [place],  [address] Phone: [phone]   Fax:  [phone]  Name: Aaron Marquez MRN: [id number] Date of Birth: [birth year]

## 2020-04-05 ENCOUNTER — Encounter: Payer: Self-pay | Admitting: Physical Therapy

## 2020-04-05 ENCOUNTER — Other Ambulatory Visit: Payer: Self-pay

## 2020-04-05 ENCOUNTER — Ambulatory Visit: Payer: Medicare Other | Admitting: Physical Therapy

## 2020-04-05 DIAGNOSIS — M542 Cervicalgia: Secondary | ICD-10-CM | POA: Diagnosis not present

## 2020-04-05 DIAGNOSIS — M62838 Other muscle spasm: Secondary | ICD-10-CM

## 2020-04-05 DIAGNOSIS — R29898 Other symptoms and signs involving the musculoskeletal system: Secondary | ICD-10-CM

## 2020-04-05 DIAGNOSIS — M545 Low back pain, unspecified: Secondary | ICD-10-CM

## 2020-04-05 NOTE — Patient Instructions (Signed)

## 2020-04-05 NOTE — Therapy (Addendum)
[facility] [address]  [place], [place], [address] Phone: [phone]   Fax:  [phone]  Physical Therapy Treatment  Patient Details  Name: Aaron Marquez MRN: [id number] Date of Birth: Jul 30, 1956 Referring Provider (PT): Kary Kos, MD   Progress Note Reporting Period 03/08/20 to 04/05/20  See note below for Objective Data and Assessment of Progress/Goals.     Encounter Date: 04/05/2020   PT End of Session - 04/05/20 0858    Visit Number 4    Number of Visits 7    Date for PT Re-Evaluation 04/19/20    Authorization Type [employer]    PT Start Time [phone]   Pt arrived late   PT Stop Time 0942    PT Time Calculation (min) 44 min    Activity Tolerance Patient tolerated treatment well    Behavior During Therapy [facility] for tasks assessed/performed           Past Medical History:  Diagnosis Date  . Back pain   . GERD (gastroesophageal reflux disease)   . Gout   . Hypertension   . Lumbar hernia   . Pneumonia 07/31/12    Past Surgical History:  Procedure Laterality Date  . BACK SURGERY    . FERTILITY SURGERY     stent  . LUMBAR LAMINECTOMY/DECOMPRESSION MICRODISCECTOMY Right 01/01/2013   Procedure: LUMBAR LAMINECTOMY/DECOMPRESSION MICRODISCECTOMY 1 LEVEL;  Surgeon: Elaina Hoops, MD;  Location: [facility];  Service: Neurosurgery;  Laterality: Right;  Lumbar Laminectomies Lumbar Four-Five, Decompression, Discectomy     There were no vitals filed for this visit.   Subjective Assessment - 04/05/20 0901    Subjective Pt stating interest in trying DN today - 1 complaint lower neck/upper shoulder tightness. Denies LBP currently.    Pertinent History lumbar hernia, HTN, gout, GERD, back pain, L4/5 laminectomy/discectomy 2014    Diagnostic tests none recent    Patient Stated Goals get rid of pain    Currently in Pain? Yes    Pain Score 8     Pain Location Neck    Pain Orientation Lower    Pain Descriptors /  Indicators Other (Comment)   "hectic"   Pain Type Chronic pain    Pain Score 0    Pain Location Back                             [facility] Adult PT Treatment/Exercise - 04/05/20 0858      Lumbar Exercises: Aerobic   Nustep L5 x 4 min (UE/LE)      Modalities   Modalities Moist Heat      Moist Heat Therapy   Moist Heat Location Cervical   H/L with bolster     Manual Therapy   Manual Therapy Soft tissue mobilization;Myofascial release    Manual therapy comments skilled palpation and monitoring during DN      Neck Exercises: Stretches   Upper Trapezius Stretch Right;Left;1 rep;30 seconds    Upper Trapezius Stretch Limitations holding onto mat; cueing to perform to tolerance    Levator Stretch Right;Left;1 rep;30 seconds    Levator Stretch Limitations hand behind hip            Trigger Point Dry Needling - 04/05/20 0858    Consent Given? Yes    Education Handout Provided Yes    Muscles Treated Head and Neck Upper trapezius;Levator scapulae   bilateral   Upper Trapezius Response  Twitch reponse elicited;Palpable increased muscle length    Levator Scapulae Response Twitch response elicited;Palpable increased muscle length                PT Education - 04/05/20 0905    Education Details Role of DN, expected response to treatment and recommended post-treatment activity level    Person(s) Educated Patient    Methods Explanation;Handout    Comprehension Verbalized understanding            PT Short Term Goals - 03/22/20 0939      PT SHORT TERM GOAL #1   Title Patient to be independent with initial HEP.    Time 3    Period Weeks    Status On-going    Target Date 03/29/20             PT Long Term Goals - 03/22/20 1031      PT LONG TERM GOAL #1   Title Patient to be independent with advanced HEP.    Time 6    Period Weeks    Status On-going      PT LONG TERM GOAL #2   Title Patient to demonstrate B LE strength >/=4+/5.    Time 6     Period Weeks    Status On-going      PT LONG TERM GOAL #3   Title Patient to demonstrate cervical AROM WFL and without pain limiting.    Time 6    Period Weeks    Status On-going      PT LONG TERM GOAL #4   Title Patient to demonstrate lumbar AROM WFL and without pain limiting.    Time 6    Period Weeks    Status On-going      PT LONG TERM GOAL #5   Title Patient to report 65% improvement in neck pain in AM.    Time 6    Period Weeks    Status On-going                 Plan - 04/05/20 0930    Clinical Impression Statement Aaron Marquez reports low back pain much improved but still having significant pain as well as increased muscle tension/tightness in lower neck and upper shoulders - pt expressing interest in trial of DN as recommended by one of his doctors. Performed DN upon informed pt consent in conjunction with manual therapy to B UT and LS with good twitch responses elicited resulting in palpable reduction in muscle tension and ttp. Reviewed relevant stretches for muscles addressed with DN, however pt's late arrival to appointment limiting further exercise review/progression. Session concluded with moist heat pack application to further promote muscle relaxation.    Comorbidities lumbar hernia, HTN, gout, GERD, back pain, L4/5 laminectomy/discectomy 2014    PT Treatment/Interventions ADLs/Self Care Home Management;Cryotherapy;Electrical Stimulation;Moist Heat;Traction;Therapeutic exercise;Therapeutic activities;Functional mobility training;Stair training;Gait training;Ultrasound;Neuromuscular re-education;Patient/family education;Manual techniques;Taping;Energy conservation;Dry needling;Passive range of motion    PT Next Visit Plan assess response to DN, progress spinal mobility and stretching, postural alignment and stabilization, cervical posturing, foam roller spinal mobility, edu for lumbar/cervical roll    Consulted and Agree with Plan of Care Patient           Patient  will benefit from skilled therapeutic intervention in order to improve the following deficits and impairments:  Hypomobility, Decreased activity tolerance, Decreased strength, Pain, Increased fascial restricitons, Difficulty walking, Increased muscle spasms, Improper body mechanics, Decreased range of motion, Impaired flexibility, Postural dysfunction  Visit Diagnosis: Cervicalgia  Chronic  left-sided low back pain, unspecified whether sciatica present  Other muscle spasm  Other symptoms and signs involving the musculoskeletal system     Problem List There are no problems to display for this patient.   Percival Spanish, PT, MPT 04/05/2020, 1:20 PM  [facility] [address]  [place], [place], [address] Phone: [phone]   Fax:  [phone]  Name: Aaron Marquez MRN: [id number] Date of Birth: April 18, 1956  PHYSICAL THERAPY DISCHARGE SUMMARY  Visits from Start of Care: 4  Current functional level related to goals / functional outcomes: Unable to assess; patient being DC'd d/t no show policy   Remaining deficits: Unable to assess   Education / Equipment: HEP  Plan: Patient agrees to discharge.  Patient goals were not met. Patient is being discharged due to not returning since the last visit.  ?????     Janene Harvey, PT, DPT 05/03/20 9:01 AM

## 2020-04-19 ENCOUNTER — Ambulatory Visit: Payer: Medicare Other | Attending: Neurosurgery | Admitting: Physical Therapy

## 2020-05-03 ENCOUNTER — Ambulatory Visit: Payer: Medicare Other | Admitting: Physical Therapy

## 2020-12-27 ENCOUNTER — Ambulatory Visit: Payer: Medicare Other

## 2020-12-27 NOTE — Progress Notes (Signed)
   Covid-19 Vaccination Clinic  Name:  Aaron Marquez    MRN: [id number] DOB: [birth year]  12/27/2020  Aaron Marquez was observed post Covid-19 immunization for 15 minutes without incident. He was provided with Vaccine Information Sheet and instruction to access the V-Safe system.   Aaron Marquez was instructed to call 911 with any severe reactions post vaccine: Marland Kitchen Difficulty breathing  . Swelling of face and throat  . A fast heartbeat  . A bad rash all over body  . Dizziness and weakness

## 2021-01-04 ENCOUNTER — Other Ambulatory Visit (HOSPITAL_BASED_OUTPATIENT_CLINIC_OR_DEPARTMENT_OTHER): Payer: Self-pay

## 2021-01-04 MED ORDER — MODERNA COVID-19 VACCINE 100 MCG/0.5ML IM SUSP
INTRAMUSCULAR | 0 refills | Status: DC
  Filled 2021-01-04: qty 0.3, 1d supply, fill #0

## 2021-03-31 ENCOUNTER — Other Ambulatory Visit (HOSPITAL_COMMUNITY): Payer: Self-pay

## 2021-04-12 ENCOUNTER — Other Ambulatory Visit (HOSPITAL_BASED_OUTPATIENT_CLINIC_OR_DEPARTMENT_OTHER): Payer: Self-pay

## 2021-07-25 ENCOUNTER — Other Ambulatory Visit (HOSPITAL_BASED_OUTPATIENT_CLINIC_OR_DEPARTMENT_OTHER): Payer: Self-pay

## 2021-07-25 ENCOUNTER — Ambulatory Visit: Payer: Medicare Other | Attending: Internal Medicine

## 2021-07-25 DIAGNOSIS — Z23 Encounter for immunization: Secondary | ICD-10-CM

## 2021-07-25 MED ORDER — MODERNA COVID-19 BIVAL BOOSTER 50 MCG/0.5ML IM SUSP
INTRAMUSCULAR | 0 refills | Status: DC
  Filled 2021-07-25: qty 0.5, 1d supply, fill #0

## 2021-07-25 NOTE — Progress Notes (Signed)
° °  Covid-19 Vaccination Clinic  Name:  Zakir Henner    MRN: [id number] DOB: 12-14-1955  07/25/2021  Mr. Savant was observed post Covid-19 immunization for 15 minutes without incident. He was provided with Vaccine Information Sheet and instruction to access the V-Safe system.   Mr. Xu was instructed to call 911 with any severe reactions post vaccine: Difficulty breathing  Swelling of face and throat  A fast heartbeat  A bad rash all over body  Dizziness and weakness   Immunizations Administered     Name Date Dose VIS Date Route   Moderna Covid-19 vaccine Bivalent Booster 07/25/2021  1:23 PM 0.5 mL 03/19/2021 Intramuscular   Manufacturer: Gala Murdoch   Lot: [id number]   NDC: 90300-923-30

## 2021-12-17 ENCOUNTER — Encounter (HOSPITAL_BASED_OUTPATIENT_CLINIC_OR_DEPARTMENT_OTHER): Payer: Self-pay | Admitting: Emergency Medicine

## 2021-12-17 ENCOUNTER — Emergency Department (HOSPITAL_BASED_OUTPATIENT_CLINIC_OR_DEPARTMENT_OTHER)
Admission: EM | Admit: 2021-12-17 | Discharge: 2021-12-18 | Disposition: A | Discharge status: Home / Self Care | Payer: Medicare HMO | Attending: Emergency Medicine | Admitting: Emergency Medicine

## 2021-12-17 ENCOUNTER — Emergency Department (HOSPITAL_BASED_OUTPATIENT_CLINIC_OR_DEPARTMENT_OTHER): Payer: Medicare HMO

## 2021-12-17 ENCOUNTER — Other Ambulatory Visit: Payer: Self-pay

## 2021-12-17 DIAGNOSIS — Z7982 Long term (current) use of aspirin: Secondary | ICD-10-CM | POA: Insufficient documentation

## 2021-12-17 DIAGNOSIS — M546 Pain in thoracic spine: Secondary | ICD-10-CM | POA: Diagnosis present

## 2021-12-17 DIAGNOSIS — I10 Essential (primary) hypertension: Secondary | ICD-10-CM | POA: Diagnosis not present

## 2021-12-17 DIAGNOSIS — Z79899 Other long term (current) drug therapy: Secondary | ICD-10-CM | POA: Insufficient documentation

## 2021-12-17 DIAGNOSIS — K4090 Unilateral inguinal hernia, without obstruction or gangrene, not specified as recurrent: Secondary | ICD-10-CM | POA: Diagnosis not present

## 2021-12-17 LAB — CBC WITH DIFFERENTIAL/PLATELET
Abs Immature Granulocytes: 0.02 10*3/uL (ref 0.00–0.07)
Basophils Absolute: 0 10*3/uL (ref 0.0–0.1)
Basophils Relative: 0 %
Eosinophils Absolute: 0.1 10*3/uL (ref 0.0–0.5)
Eosinophils Relative: 1 %
HCT: 45.5 % (ref 39.0–52.0)
Hemoglobin: 15.2 g/dL (ref 13.0–17.0)
Immature Granulocytes: 0 %
Lymphocytes Relative: 35 %
Lymphs Abs: 1.7 10*3/uL (ref 0.7–4.0)
MCH: 26.4 pg (ref 26.0–34.0)
MCHC: 33.4 g/dL (ref 30.0–36.0)
MCV: 79 fL — ABNORMAL LOW (ref 80.0–100.0)
Monocytes Absolute: 0.5 10*3/uL (ref 0.1–1.0)
Monocytes Relative: 10 %
Neutro Abs: 2.6 10*3/uL (ref 1.7–7.7)
Neutrophils Relative %: 54 %
Platelets: 159 10*3/uL (ref 150–400)
RBC: 5.76 MIL/uL (ref 4.22–5.81)
RDW: 13.2 % (ref 11.5–15.5)
WBC: 4.9 10*3/uL (ref 4.0–10.5)
nRBC: 0 % (ref 0.0–0.2)

## 2021-12-17 LAB — BASIC METABOLIC PANEL
Anion gap: 7 (ref 5–15)
BUN: 20 mg/dL (ref 8–23)
CO2: 26 mmol/L (ref 22–32)
Calcium: 8.8 mg/dL — ABNORMAL LOW (ref 8.9–10.3)
Chloride: 107 mmol/L (ref 98–111)
Creatinine, Ser: 1.73 mg/dL — ABNORMAL HIGH (ref 0.61–1.24)
GFR, Estimated: 43 mL/min — ABNORMAL LOW (ref >60)
Glucose, Bld: 98 mg/dL (ref 70–99)
Potassium: 3.8 mmol/L (ref 3.5–5.1)
Sodium: 140 mmol/L (ref 135–145)

## 2021-12-17 LAB — TROPONIN I (HIGH SENSITIVITY)
Troponin I (High Sensitivity): 6 ng/L (ref <18)
Troponin I (High Sensitivity): 6 ng/L (ref <18)

## 2021-12-17 MED ORDER — ONDANSETRON HCL 4 MG/2ML IJ SOLN
4.0000 mg | Freq: Once | INTRAMUSCULAR | Status: AC
Start: 2021-12-17 — End: 2021-12-17
  Administered 2021-12-17: 4 mg via INTRAVENOUS
  Filled 2021-12-17: qty 2

## 2021-12-17 MED ORDER — PREDNISONE 10 MG (21) PO TBPK: ORAL_TABLET | Freq: Every day | ORAL | 0 refills | Status: DC

## 2021-12-17 MED ORDER — HYDROMORPHONE HCL 1 MG/ML IJ SOLN
1.0000 mg | Freq: Once | INTRAMUSCULAR | Status: AC
  Administered 2021-12-17: 1 mg via INTRAVENOUS
  Filled 2021-12-17: qty 1

## 2021-12-17 MED ORDER — METOPROLOL TARTRATE 25 MG PO TABS
50.0000 mg | ORAL_TABLET | Freq: Once | ORAL | Status: AC
  Administered 2021-12-17: 50 mg via ORAL
  Filled 2021-12-17: qty 2

## 2021-12-17 MED ORDER — IOHEXOL 350 MG/ML SOLN
80.0000 mL | Freq: Once | INTRAVENOUS | Status: AC | PRN
  Administered 2021-12-17: 100 mL via INTRAVENOUS

## 2021-12-17 NOTE — ED Triage Notes (Signed)
Pt having lower back pain radiating to hips for 4 days.  No heavy lifting or injuries.  No dysuria.  History of back surgery.   ?

## 2021-12-17 NOTE — Discharge Instructions (Addendum)
For the incidental inguinal hernia, follow-up with a general surgeon to discuss further management. ? ?For your back issues, please follow-up with your neurosurgeon, Dr. Wynetta Emery.  Recommend taking the course of steroids as prescribed.  If ever you develop numbness, weakness, tingling in your extremities, uncontrolled pain or fever, come back to ER for reassessment. ? ?I would also advise following up with your primary care doctor to discuss management of your blood pressure. ? ? ? ?

## 2021-12-17 NOTE — ED Provider Notes (Addendum)
?[facility] EMERGENCY DEPARTMENT ?Provider Note ? ? ?CSN: [id number] ?Arrival date & time: 12/17/21  1733 ? ?  ? ?History ? ?Chief Complaint  ?Patient presents with  ? Back Pain  ? ? ?Aaron Marquez is a 66 y.o. male.  Presents for back pain.  Patient reports that he struggled with back pain for a long time, has been followed by neurosurgery in the past, Dr. Wynetta Emery.  He states that the most recent flare of pain started in his lower back and radiating to both of his hips.  Over the last few days however he has noticed some pain in his upper back between his shoulder blades, states that he normally does not have pain in his upper back, pain is up to 9 or 10 out of 10 in severity.  Sharp, cramping, stabbing pain.  Does not radiate into the chest.  He does not have any difficulty breathing.  No numbness or weakness in his arms or legs.  No fever or chills. ? ?HPI ? ?  ? ?Home Medications ?Prior to Admission medications   ?Medication Sig Start Date End Date Taking? Authorizing Provider  ?predniSONE (STERAPRED UNI-PAK 21 TAB) 10 MG (21) TBPK tablet Take by mouth daily. Take 6 tabs by mouth daily  for 1 day, then 5 tabs for 1 day, then 4 tabs for 1 day, then 3 tabs for 1 day, 2 tabs for 1 day, then 1 tab by mouth daily for 1 day 12/17/21  Yes Milagros Loll, MD  ?aspirin 81 MG tablet Take 81 mg by mouth daily.    [provider]  ?atorvastatin (LIPITOR) 80 MG tablet Take 80 mg by mouth daily.    [provider]  ?COVID-19 mRNA bivalent vaccine, Moderna, (MODERNA COVID-19 BIVAL BOOSTER) 50 MCG/0.5ML injection Inject into the muscle. 07/25/21   Judyann Munson, MD  ?COVID-19 mRNA vaccine, Moderna, (MODERNA COVID-19 VACCINE) 100 MCG/0.5ML injection Inject into the muscle. 12/27/20   Judyann Munson, MD  ?methocarbamol (ROBAXIN) 500 MG tablet Take 1 tablet (500 mg total) by mouth 2 (two) times daily. 07/31/19   Khatri, Hina, PA-C  ?metoprolol (LOPRESSOR) 50 MG tablet Take 50 mg by mouth 2 (two) times  daily.    [provider]  ?metoprolol succinate (TOPROL-XL) 50 MG 24 hr tablet Take 50 mg by mouth 2 (two) times daily. Take with or immediately following a meal.    [provider]  ?omeprazole (PRILOSEC) 20 MG capsule Take 20 mg by mouth 2 (two) times daily.    [provider]  ?traMADol (ULTRAM) 50 MG tablet Take 1 tablet (50 mg total) by mouth every 6 (six) hours as needed. 05/08/18   Raeford Razor, MD  ?   ? ?Allergies    ?Lisinopril   ? ?Review of Systems   ?Review of Systems  ?Constitutional:  Negative for chills and fever.  ?HENT:  Negative for ear pain and sore throat.   ?Eyes:  Negative for pain and visual disturbance.  ?Respiratory:  Negative for cough and shortness of breath.   ?Cardiovascular:  Positive for chest pain. Negative for palpitations.  ?Gastrointestinal:  Negative for abdominal pain and vomiting.  ?Genitourinary:  Negative for dysuria and hematuria.  ?Musculoskeletal:  Positive for back pain. Negative for arthralgias.  ?Skin:  Negative for color change and rash.  ?Neurological:  Negative for seizures and syncope.  ?All other systems reviewed and are negative. ? ?Physical Exam ?Updated Vital Signs ?BP (!) 158/100   Pulse 71  Temp 98.3 ?F (36.8 ?C) (Oral)   Resp 12   Ht 5\' 8"  (1.727 m)   Wt 123.4 kg   SpO2 95%   BMI 41.36 kg/m?  ?Physical Exam ?Vitals and nursing note reviewed.  ?Constitutional:   ?   General: He is not in acute distress. ?   Appearance: He is well-developed.  ?HENT:  ?   Head: Normocephalic and atraumatic.  ?Eyes:  ?   Conjunctiva/sclera: Conjunctivae normal.  ?Cardiovascular:  ?   Rate and Rhythm: Normal rate and regular rhythm.  ?   Heart sounds: No murmur heard. ?Pulmonary:  ?   Effort: Pulmonary effort is normal. No respiratory distress.  ?   Breath sounds: Normal breath sounds.  ?Abdominal:  ?   Palpations: Abdomen is soft.  ?   Tenderness: There is no abdominal tenderness.  ?Musculoskeletal:     ?   General: No swelling, deformity or  signs of injury.  ?   Cervical back: Neck supple.  ?   Comments: There is some generalized tenderness to palpation throughout the and L-spine but no step-off or deformity noted, no redness noted  ?Skin: ?   General: Skin is warm and dry.  ?   Capillary Refill: Capillary refill takes less than 2 seconds.  ?Neurological:  ?   Mental Status: He is alert.  ?   Comments: Normal strength and sensation in his lower extremities  ?Psychiatric:     ?   Mood and Affect: Mood normal.  ? ? ?ED Results / Procedures / Treatments   ?Labs ?(all labs ordered are listed, but only abnormal results are displayed) ?Labs Reviewed  ?CBC WITH DIFFERENTIAL/PLATELET - Abnormal; Notable for the following components:  ?    Result Value  ? MCV 79.0 (*)   ? All other components within normal limits  ?BASIC METABOLIC PANEL - Abnormal; Notable for the following components:  ? Creatinine, Ser 1.73 (*)   ? Calcium 8.8 (*)   ? GFR, Estimated 43 (*)   ? All other components within normal limits  ?TROPONIN I (HIGH SENSITIVITY)  ?TROPONIN I (HIGH SENSITIVITY)  ? ? ?EKG ?None ? ?Radiology ?DG Thoracic Spine W/Swimmers ? ?Result Date: 12/17/2021 ?CLINICAL DATA:  Lower back pain. EXAM: THORACIC SPINE - 3 VIEWS COMPARISON:  None Available. FINDINGS: There is no evidence of thoracic spine fracture. Alignment is normal. Mild to moderate severity lateral osteophyte formation and intervertebral disc space narrowing is seen within the midthoracic spine. No other significant bone abnormalities are identified. IMPRESSION: Mild to moderate severity degenerative changes within the midthoracic spine. Electronically Signed   By: Virgina Norfolk M.D.   On: 12/17/2021 18:31  ? ?DG Lumbar Spine Complete ? ?Result Date: 12/17/2021 ?CLINICAL DATA:  Lower back pain. EXAM: LUMBAR SPINE - COMPLETE 4+ VIEW COMPARISON:  None Available. FINDINGS: There is no evidence of lumbar spine fracture. A chronic appearing deformity is seen within the mid to lower sacrum. Alignment is  normal. Marked severity endplate sclerosis and moderate severity anterior osteophyte formation are seen at the levels of L4-L5 and L5-S1. Mild intervertebral disc space narrowing is seen at L4-L5 and L5-S1. IMPRESSION: Degenerative disc disease at L4-L5 and L5-S1. Electronically Signed   By: Virgina Norfolk M.D.   On: 12/17/2021 18:30  ? ?CT Angio Chest/Abd/Pel for Dissection W and/or Wo Contrast ? ?Result Date: 12/17/2021 ?CLINICAL DATA:  Lower back pain. EXAM: CT ANGIOGRAPHY CHEST, ABDOMEN AND PELVIS TECHNIQUE: Non-contrast CT of the chest was initially obtained. Multidetector CT imaging  through the chest, abdomen and pelvis was performed using the standard protocol during bolus administration of intravenous contrast. Multiplanar reconstructed images and MIPs were obtained and reviewed to evaluate the vascular anatomy. RADIATION DOSE REDUCTION: This exam was performed according to the departmental dose-optimization program which includes automated exposure control, adjustment of the mA and/or kV according to patient size and/or use of iterative reconstruction technique. CONTRAST:  132mL OMNIPAQUE IOHEXOL 350 MG/ML SOLN COMPARISON:  June 01, 2018 FINDINGS: CTA CHEST FINDINGS Cardiovascular: Satisfactory opacification of the pulmonary arteries to the segmental level. No evidence of pulmonary embolism. Normal heart size. No pericardial effusion. Mediastinum/Nodes: No enlarged mediastinal, hilar, or axillary lymph nodes. Thyroid gland, trachea, and esophagus demonstrate no significant findings. Lungs/Pleura: Lungs are clear. No pleural effusion or pneumothorax. Musculoskeletal: Degenerative changes seen within the thoracic spine. Review of the MIP images confirms the above findings. CTA ABDOMEN AND PELVIS FINDINGS VASCULAR Aorta: Normal caliber aorta without aneurysm, dissection, vasculitis or significant stenosis. Celiac: Patent without evidence of aneurysm, dissection, vasculitis or significant stenosis. SMA:  Patent without evidence of aneurysm, dissection, vasculitis or significant stenosis. Renals: Both renal arteries are patent without evidence of aneurysm, dissection, vasculitis, fibromuscular dysplasia or significan

## 2022-07-08 ENCOUNTER — Emergency Department (HOSPITAL_BASED_OUTPATIENT_CLINIC_OR_DEPARTMENT_OTHER)
Admission: EM | Admit: 2022-07-08 | Discharge: 2022-07-08 | Disposition: A | Discharge status: Home / Self Care | Payer: Medicare HMO | Attending: Emergency Medicine | Admitting: Emergency Medicine

## 2022-07-08 ENCOUNTER — Telehealth (HOSPITAL_BASED_OUTPATIENT_CLINIC_OR_DEPARTMENT_OTHER): Payer: Self-pay | Admitting: Emergency Medicine

## 2022-07-08 ENCOUNTER — Encounter (HOSPITAL_BASED_OUTPATIENT_CLINIC_OR_DEPARTMENT_OTHER): Payer: Self-pay | Admitting: Emergency Medicine

## 2022-07-08 ENCOUNTER — Emergency Department (HOSPITAL_BASED_OUTPATIENT_CLINIC_OR_DEPARTMENT_OTHER): Payer: Medicare HMO

## 2022-07-08 ENCOUNTER — Other Ambulatory Visit: Payer: Self-pay

## 2022-07-08 DIAGNOSIS — U071 COVID-19: Secondary | ICD-10-CM | POA: Diagnosis not present

## 2022-07-08 DIAGNOSIS — R0981 Nasal congestion: Secondary | ICD-10-CM | POA: Diagnosis present

## 2022-07-08 LAB — RESP PANEL BY RT-PCR (FLU A&B, COVID) ARPGX2
Influenza A by PCR: NEGATIVE
Influenza B by PCR: NEGATIVE
SARS Coronavirus 2 by RT PCR: POSITIVE — AB

## 2022-07-08 MED ORDER — GUAIFENESIN 100 MG/5ML PO LIQD
15.0000 mL | Freq: Once | ORAL | Status: AC
  Administered 2022-07-08: 15 mL via ORAL
  Filled 2022-07-08: qty 20

## 2022-07-08 MED ORDER — IBUPROFEN 800 MG PO TABS
800.0000 mg | ORAL_TABLET | Freq: Once | ORAL | Status: AC
  Administered 2022-07-08: 800 mg via ORAL
  Filled 2022-07-08: qty 1

## 2022-07-08 MED ORDER — NIRMATRELVIR/RITONAVIR (PAXLOVID) TABLET (RENAL DOSING): 2.0000 | ORAL_TABLET | Freq: Two times a day (BID) | ORAL | 0 refills | Status: DC

## 2022-07-08 MED ORDER — NIRMATRELVIR/RITONAVIR (PAXLOVID) TABLET (RENAL DOSING): 2.0000 | ORAL_TABLET | Freq: Two times a day (BID) | ORAL | 0 refills | Status: AC

## 2022-07-08 MED ORDER — OXYMETAZOLINE HCL 0.05 % NA SOLN
1.0000 | Freq: Once | NASAL | Status: AC
  Administered 2022-07-08: 1 via NASAL
  Filled 2022-07-08: qty 30

## 2022-07-08 NOTE — Discharge Instructions (Signed)
Rest, increase hydration, increase vitamin C intake like strawberries, lemon, orange juice, and prescription for antiviral Paxlovid medication from pharmacy.  Take Motrin or Tylenol every 6 hours for fevers or body aches.

## 2022-07-08 NOTE — ED Triage Notes (Signed)
Pt arrives pov, steady gait, c/o bilateral hip pain, shoulder pain, and cough with chest and nasal congestion starting last night. Endorses recent cataract surgery.

## 2022-07-08 NOTE — ED Provider Notes (Signed)
[facility] EMERGENCY DEPARTMENT Provider Note   CSN: [id number] Arrival date & time: 07/08/22  1228     History {Add pertinent medical, surgical, social history, OB history to HPI:1} Chief Complaint  Patient presents with   Nasal Congestion    Aaron Marquez is a 66 y.o. male.  Pt is a 66 yo male presenting for nasal congestion, rhinnohia, body aches, and cough x 2-3 days ago. Admits to subjective fevers. Wife had cold like symptoms two weeks ago.   The history is provided by the patient. No language interpreter was used.       Home Medications Prior to Admission medications   Medication Sig Start Date End Date Taking? Authorizing Provider  nirmatrelvir/ritonavir EUA, renal dosing, (PAXLOVID) 10 x 150 MG & 10 x 100MG  TABS Take 2 tablets by mouth 2 (two) times daily for 5 days. Patient GFR is 43. Take nirmatrelvir (150 mg) one tablet twice daily for 5 days and ritonavir (100 mg) one tablet twice daily for 5 days. 07/08/22 07/13/22 Yes 14/7/23 P, DO  aspirin 81 MG tablet Take 81 mg by mouth daily.    [provider]  atorvastatin (LIPITOR) 80 MG tablet Take 80 mg by mouth daily.    [provider]  COVID-19 mRNA bivalent vaccine, Moderna, (MODERNA COVID-19 BIVAL BOOSTER) 50 MCG/0.5ML injection Inject into the muscle. 07/25/21   07/27/21, MD  COVID-19 mRNA vaccine, Moderna, (MODERNA COVID-19 VACCINE) 100 MCG/0.5ML injection Inject into the muscle. 12/27/20   12/29/20, MD  methocarbamol (ROBAXIN) 500 MG tablet Take 1 tablet (500 mg total) by mouth 2 (two) times daily. 07/31/19   Khatri, Hina, PA-C  metoprolol (LOPRESSOR) 50 MG tablet Take 50 mg by mouth 2 (two) times daily.    [provider]  metoprolol succinate (TOPROL-XL) 50 MG 24 hr tablet Take 50 mg by mouth 2 (two) times daily. Take with or immediately following a meal.    [provider]  omeprazole (PRILOSEC) 20 MG capsule Take 20 mg by mouth 2 (two) times daily.     [provider]  predniSONE (STERAPRED UNI-PAK 21 TAB) 10 MG (21) TBPK tablet Take by mouth daily. Take 6 tabs by mouth daily  for 1 day, then 5 tabs for 1 day, then 4 tabs for 1 day, then 3 tabs for 1 day, 2 tabs for 1 day, then 1 tab by mouth daily for 1 day 12/17/21   12/19/21, MD  traMADol (ULTRAM) 50 MG tablet Take 1 tablet (50 mg total) by mouth every 6 (six) hours as needed. 05/08/18   07/08/18, MD      Allergies    Lisinopril    Review of Systems   Review of Systems  Constitutional:  Positive for fever. Negative for chills.  HENT:  Positive for congestion and rhinorrhea. Negative for ear pain and sore throat.   Eyes:  Negative for pain and visual disturbance.  Respiratory:  Positive for cough. Negative for shortness of breath.   Cardiovascular:  Negative for chest pain and palpitations.  Gastrointestinal:  Negative for abdominal pain and vomiting.  Genitourinary:  Negative for dysuria and hematuria.  Musculoskeletal:  Negative for arthralgias and back pain.  Skin:  Negative for color change and rash.  Neurological:  Negative for seizures and syncope.  All other systems reviewed and are negative.   Physical Exam Updated Vital Signs BP (!) 177/96   Pulse 84   Temp 100 F (37.8 C) (Oral)  Resp 18   Ht 5\' 8"  (1.727 m)   Wt 121.6 kg   SpO2 92%   BMI 40.75 kg/m  Physical Exam Vitals and nursing note reviewed.  Constitutional:      General: He is not in acute distress.    Appearance: He is well-developed.  HENT:     Head: Normocephalic and atraumatic.  Eyes:     Conjunctiva/sclera: Conjunctivae normal.  Cardiovascular:     Rate and Rhythm: Normal rate and regular rhythm.     Heart sounds: No murmur heard. Pulmonary:     Effort: Pulmonary effort is normal. No respiratory distress.     Breath sounds: Normal breath sounds.  Abdominal:     Palpations: Abdomen is soft.     Tenderness: There is no abdominal tenderness.  Musculoskeletal:         General: No swelling.     Cervical back: Neck supple.  Skin:    General: Skin is warm and dry.     Capillary Refill: Capillary refill takes less than 2 seconds.  Neurological:     Mental Status: He is alert.  Psychiatric:        Mood and Affect: Mood normal.     ED Results / Procedures / Treatments   Labs (all labs ordered are listed, but only abnormal results are displayed) Labs Reviewed  RESP PANEL BY RT-PCR (FLU A&B, COVID) ARPGX2 - Abnormal; Notable for the following components:      Result Value   SARS Coronavirus 2 by RT PCR POSITIVE (*)    All other components within normal limits    EKG None  Radiology DG Chest 2 View  Result Date: 07/08/2022 CLINICAL DATA:  Cough since last night with chest pain. EXAM: CHEST - 2 VIEW COMPARISON:  July 31, 2019 FINDINGS: The heart size and mediastinal contours are stable. Both lungs are clear. The visualized skeletal structures are unremarkable. IMPRESSION: No active cardiopulmonary disease. Electronically Signed   By: August 02, 2019 M.D.   On: 07/08/2022 13:41    Procedures Procedures  {Document cardiac monitor, telemetry assessment procedure when appropriate:1}  Medications Ordered in ED Medications  ibuprofen (ADVIL) tablet 800 mg (has no administration in time range)  oxymetazoline (AFRIN) 0.05 % nasal spray 1 spray (has no administration in time range)  guaiFENesin (ROBITUSSIN) 100 MG/5ML liquid 15 mL (has no administration in time range)    ED Course/ Medical Decision Making/ A&P                           Medical Decision Making Amount and/or Complexity of Data Reviewed Radiology: ordered.   2:50 PM  66 yo male presenting for nasal congestion, rhinnohia, body aches, and cough x 2-3 days ago.  Patient Covid +. Recommended for rest, hydration, increased vitamin C. Paxlovid sent to pharmacy.   NO s/s sepsis. Pt nontoxic appearing. Stable vitals. Motrin given for low grade temp of 100F.  Patient in no distress and  overall condition improved here in the ED. Detailed discussions were had with the patient regarding current findings, and need for close f/u with PCP or on call doctor. The patient has been instructed to return immediately if the symptoms worsen in any way for re-evaluation. Patient verbalized understanding and is in agreement with current care plan. All questions answered prior to discharge.    {Document critical care time when appropriate:1} {Document review of labs and clinical decision tools ie heart score, Chads2Vasc2 etc:1}  {  Document your independent review of radiology images, and any outside records:1} {Document your discussion with family members, caretakers, and with consultants:1} {Document social determinants of health affecting pt's care:1} {Document your decision making why or why not admission, treatments were needed:1} Final Clinical Impression(s) / ED Diagnoses Final diagnoses:  COVID    Rx / DC Orders ED Discharge Orders          Ordered    nirmatrelvir/ritonavir EUA, renal dosing, (PAXLOVID) 10 x 150 MG & 10 x 100MG  TABS  2 times daily        07/08/22 1450

## 2022-07-08 NOTE — Telephone Encounter (Signed)
Changing pharmacy for medication

## 2022-08-04 ENCOUNTER — Emergency Department (HOSPITAL_BASED_OUTPATIENT_CLINIC_OR_DEPARTMENT_OTHER)
Admission: EM | Admit: 2022-08-04 | Discharge: 2022-08-04 | Disposition: A | Discharge status: Home / Self Care | Payer: Medicare HMO | Attending: Emergency Medicine | Admitting: Emergency Medicine

## 2022-08-04 ENCOUNTER — Emergency Department (HOSPITAL_BASED_OUTPATIENT_CLINIC_OR_DEPARTMENT_OTHER): Payer: Medicare HMO

## 2022-08-04 ENCOUNTER — Other Ambulatory Visit: Payer: Self-pay

## 2022-08-04 ENCOUNTER — Encounter (HOSPITAL_BASED_OUTPATIENT_CLINIC_OR_DEPARTMENT_OTHER): Payer: Self-pay | Admitting: Pediatrics

## 2022-08-04 DIAGNOSIS — I10 Essential (primary) hypertension: Secondary | ICD-10-CM | POA: Insufficient documentation

## 2022-08-04 DIAGNOSIS — Z7982 Long term (current) use of aspirin: Secondary | ICD-10-CM | POA: Insufficient documentation

## 2022-08-04 DIAGNOSIS — M79602 Pain in left arm: Secondary | ICD-10-CM | POA: Insufficient documentation

## 2022-08-04 DIAGNOSIS — Z79899 Other long term (current) drug therapy: Secondary | ICD-10-CM | POA: Insufficient documentation

## 2022-08-04 DIAGNOSIS — W06XXXA Fall from bed, initial encounter: Secondary | ICD-10-CM | POA: Insufficient documentation

## 2022-08-04 MED ORDER — KETOROLAC TROMETHAMINE 60 MG/2ML IM SOLN
30.0000 mg | Freq: Once | INTRAMUSCULAR | Status: AC
Start: 2022-08-04 — End: 2022-08-04
  Administered 2022-08-04: 30 mg via INTRAMUSCULAR
  Filled 2022-08-04: qty 2

## 2022-08-04 MED ORDER — METHYLPREDNISOLONE 4 MG PO TBPK: ORAL_TABLET | ORAL | 0 refills | Status: DC

## 2022-08-04 NOTE — ED Notes (Signed)
ED Provider at bedside. 

## 2022-08-04 NOTE — ED Provider Notes (Signed)
[facility] EMERGENCY DEPARTMENT Provider Note   CSN: [id number] Arrival date & time: 08/04/22  1438     History  Chief Complaint  Patient presents with   Arm Pain    Aaron Marquez is a 66 y.o. male.  Patient here with pain in his left bicep for the last few weeks after fall.  History of hypertension.  Patient fell out of his bed and landed on his arm.  Did not have it imaged.  He has been having some stiffness and discomfort ever since.  Denies any weakness or numbness or chest pain or shortness of breath or abdominal pain.  The history is provided by the patient.       Home Medications Prior to Admission medications   Medication Sig Start Date End Date Taking? Authorizing Provider  methylPREDNISolone (MEDROL DOSEPAK) 4 MG TBPK tablet Follow package insert 08/04/22  Yes Santos Sollenberger, DO  aspirin 81 MG tablet Take 81 mg by mouth daily.    [provider]  atorvastatin (LIPITOR) 80 MG tablet Take 80 mg by mouth daily.    [provider]  COVID-19 mRNA bivalent vaccine, Moderna, (MODERNA COVID-19 BIVAL BOOSTER) 50 MCG/0.5ML injection Inject into the muscle. 07/25/21   Judyann Munson, MD  COVID-19 mRNA vaccine, Moderna, (MODERNA COVID-19 VACCINE) 100 MCG/0.5ML injection Inject into the muscle. 12/27/20   Judyann Munson, MD  methocarbamol (ROBAXIN) 500 MG tablet Take 1 tablet (500 mg total) by mouth 2 (two) times daily. 07/31/19   Khatri, Hina, PA-C  metoprolol (LOPRESSOR) 50 MG tablet Take 50 mg by mouth 2 (two) times daily.    [provider]  metoprolol succinate (TOPROL-XL) 50 MG 24 hr tablet Take 50 mg by mouth 2 (two) times daily. Take with or immediately following a meal.    [provider]  omeprazole (PRILOSEC) 20 MG capsule Take 20 mg by mouth 2 (two) times daily.    [provider]  predniSONE (STERAPRED UNI-PAK 21 TAB) 10 MG (21) TBPK tablet Take by mouth daily. Take 6 tabs by mouth daily  for 1 day, then 5 tabs  for 1 day, then 4 tabs for 1 day, then 3 tabs for 1 day, 2 tabs for 1 day, then 1 tab by mouth daily for 1 day 12/17/21   Milagros Loll, MD  traMADol (ULTRAM) 50 MG tablet Take 1 tablet (50 mg total) by mouth every 6 (six) hours as needed. 05/08/18   Raeford Razor, MD      Allergies    Lisinopril    Review of Systems   Review of Systems  Physical Exam Updated Vital Signs BP (!) 163/98   Pulse 67   Temp 97.9 F (36.6 C) (Oral)   Resp 18   Ht 5\' 8"  (1.727 m)   Wt 121.6 kg   SpO2 97%   BMI 40.75 kg/m  Physical Exam Vitals and nursing note reviewed.  Constitutional:      General: He is not in acute distress.    Appearance: He is well-developed.  HENT:     Head: Normocephalic and atraumatic.  Eyes:     Extraocular Movements: Extraocular movements intact.     Conjunctiva/sclera: Conjunctivae normal.     Pupils: Pupils are equal, round, and reactive to light.  Cardiovascular:     Rate and Rhythm: Normal rate and regular rhythm.     Pulses: Normal pulses.     Heart sounds: Normal heart sounds. No murmur heard. Pulmonary:     Effort:  Pulmonary effort is normal. No respiratory distress.     Breath sounds: Normal breath sounds.  Abdominal:     General: Abdomen is flat.     Palpations: Abdomen is soft.     Tenderness: There is no abdominal tenderness.  Musculoskeletal:        General: Tenderness present. No swelling.     Cervical back: Neck supple.     Comments: Tenderness within the left bicep area, left bicipital groove  Skin:    General: Skin is warm and dry.     Capillary Refill: Capillary refill takes less than 2 seconds.  Neurological:     General: No focal deficit present.     Mental Status: He is alert and oriented to person, place, and time.     Cranial Nerves: No cranial nerve deficit.     Sensory: No sensory deficit.     Motor: No weakness.     Coordination: Coordination normal.     Comments: 5+ out of 5 strength, normal sensation  Psychiatric:         Mood and Affect: Mood normal.     ED Results / Procedures / Treatments   Labs (all labs ordered are listed, but only abnormal results are displayed) Labs Reviewed - No data to display  EKG None  Radiology DG Humerus Left  Result Date: 08/04/2022 CLINICAL DATA:  Fall, persistent left arm pain EXAM: LEFT HUMERUS - 2+ VIEW COMPARISON:  None Available. FINDINGS: There is no evidence of fracture or other focal bone lesions. Soft tissues are unremarkable. IMPRESSION: Negative. Electronically Signed   By: Helyn Numbers M.D.   On: 08/04/2022 16:30    Procedures Procedures    Medications Ordered in ED Medications  ketorolac (TORADOL) injection 30 mg (30 mg Intramuscular Given 08/04/22 1608)    ED Course/ Medical Decision Making/ A&P                           Medical Decision Making Amount and/or Complexity of Data Reviewed Radiology: ordered.  Risk Prescription drug management.   Aaron Marquez is here with left arm pain.  He is tender in the left bicep area.  He states that he rolled out of bed and landed on his left arm a few weeks ago and still having some discomfort.  He is tender mostly in the left bicep muscle area.  X-ray of the left humerus was ordered and showed no fracture or malalignment per my review interpretation.  Not sure if this is an ongoing contusion or muscle spasm or more of a ligamentous soft tissue problem.  Will prescribe muscle relaxant and recommend Tylenol and ibuprofen and lidocaine patch.  Will have him follow-up with sports medicine.  He is neurovascular neuromuscularly intact.  Discharged in good condition.  Understands return precautions.  This chart was dictated using voice recognition software.  Despite best efforts to proofread,  errors can occur which can change the documentation meaning.         Final Clinical Impression(s) / ED Diagnoses Final diagnoses:  Left arm pain    Rx / DC Orders ED Discharge Orders          Ordered     methylPREDNISolone (MEDROL DOSEPAK) 4 MG TBPK tablet        08/04/22 1657              Virgina Norfolk, DO 08/04/22 1657

## 2022-08-04 NOTE — ED Triage Notes (Signed)
Reported fell 6 weeks ago and still have some aching pain on left arm; with limited mobility; painful on palpation.

## 2022-08-08 ENCOUNTER — Telehealth: Payer: Self-pay

## 2022-08-08 NOTE — Telephone Encounter (Signed)
Called patient in regards to ED f/u, no answer and voicemail is full.

## 2023-01-10 ENCOUNTER — Encounter (HOSPITAL_BASED_OUTPATIENT_CLINIC_OR_DEPARTMENT_OTHER): Payer: Self-pay | Admitting: Emergency Medicine

## 2023-01-10 ENCOUNTER — Emergency Department (HOSPITAL_BASED_OUTPATIENT_CLINIC_OR_DEPARTMENT_OTHER)
Admission: EM | Admit: 2023-01-10 | Discharge: 2023-01-10 | Disposition: A | Discharge status: Home / Self Care | Payer: Medicare HMO | Attending: Emergency Medicine | Admitting: Emergency Medicine

## 2023-01-10 DIAGNOSIS — M545 Low back pain, unspecified: Secondary | ICD-10-CM | POA: Diagnosis present

## 2023-01-10 DIAGNOSIS — G8929 Other chronic pain: Secondary | ICD-10-CM | POA: Insufficient documentation

## 2023-01-10 DIAGNOSIS — M5417 Radiculopathy, lumbosacral region: Secondary | ICD-10-CM

## 2023-01-10 DIAGNOSIS — Z7982 Long term (current) use of aspirin: Secondary | ICD-10-CM | POA: Diagnosis not present

## 2023-01-10 LAB — CBC WITH DIFFERENTIAL/PLATELET
Abs Immature Granulocytes: 0.02 10*3/uL (ref 0.00–0.07)
Basophils Absolute: 0 10*3/uL (ref 0.0–0.1)
Basophils Relative: 0 %
Eosinophils Absolute: 0 10*3/uL (ref 0.0–0.5)
Eosinophils Relative: 0 %
HCT: 45.8 % (ref 39.0–52.0)
Hemoglobin: 15.3 g/dL (ref 13.0–17.0)
Immature Granulocytes: 0 %
Lymphocytes Relative: 18 %
Lymphs Abs: 1.1 10*3/uL (ref 0.7–4.0)
MCH: 26.5 pg (ref 26.0–34.0)
MCHC: 33.4 g/dL (ref 30.0–36.0)
MCV: 79.4 fL — ABNORMAL LOW (ref 80.0–100.0)
Monocytes Absolute: 0.4 10*3/uL (ref 0.1–1.0)
Monocytes Relative: 7 %
Neutro Abs: 4.6 10*3/uL (ref 1.7–7.7)
Neutrophils Relative %: 75 %
Platelets: 174 10*3/uL (ref 150–400)
RBC: 5.77 MIL/uL (ref 4.22–5.81)
RDW: 13.2 % (ref 11.5–15.5)
WBC: 6.2 10*3/uL (ref 4.0–10.5)
nRBC: 0 % (ref 0.0–0.2)

## 2023-01-10 LAB — COMPREHENSIVE METABOLIC PANEL
ALT: 24 U/L (ref 0–44)
AST: 23 U/L (ref 15–41)
Albumin: 4 g/dL (ref 3.5–5.0)
Alkaline Phosphatase: 72 U/L (ref 38–126)
Anion gap: 7 (ref 5–15)
BUN: 20 mg/dL (ref 8–23)
CO2: 23 mmol/L (ref 22–32)
Calcium: 8.7 mg/dL — ABNORMAL LOW (ref 8.9–10.3)
Chloride: 105 mmol/L (ref 98–111)
Creatinine, Ser: 1.55 mg/dL — ABNORMAL HIGH (ref 0.61–1.24)
GFR, Estimated: 49 mL/min — ABNORMAL LOW (ref >60)
Glucose, Bld: 110 mg/dL — ABNORMAL HIGH (ref 70–99)
Potassium: 4.3 mmol/L (ref 3.5–5.1)
Sodium: 135 mmol/L (ref 135–145)
Total Bilirubin: 1.1 mg/dL (ref 0.3–1.2)
Total Protein: 7.4 g/dL (ref 6.5–8.1)

## 2023-01-10 LAB — URINALYSIS, ROUTINE W REFLEX MICROSCOPIC
Bilirubin Urine: NEGATIVE
Glucose, UA: NEGATIVE mg/dL
Hgb urine dipstick: NEGATIVE
Ketones, ur: NEGATIVE mg/dL
Leukocytes,Ua: NEGATIVE
Nitrite: NEGATIVE
Protein, ur: NEGATIVE mg/dL
Specific Gravity, Urine: 1.025 (ref 1.005–1.030)
pH: 6 (ref 5.0–8.0)

## 2023-01-10 MED ORDER — PREDNISONE 10 MG (21) PO TBPK: ORAL_TABLET | Freq: Every day | ORAL | 0 refills | Status: DC

## 2023-01-10 MED ORDER — PREDNISONE 50 MG PO TABS
60.0000 mg | ORAL_TABLET | Freq: Once | ORAL | Status: AC
  Administered 2023-01-10: 60 mg via ORAL
  Filled 2023-01-10: qty 1

## 2023-01-10 MED ORDER — HYDROMORPHONE HCL 1 MG/ML IJ SOLN
1.0000 mg | Freq: Once | INTRAMUSCULAR | Status: AC
  Administered 2023-01-10: 1 mg via INTRAVENOUS
  Filled 2023-01-10: qty 1

## 2023-01-10 MED ORDER — HYDROCODONE-ACETAMINOPHEN 5-325 MG PO TABS: 1.0000 | ORAL_TABLET | Freq: Four times a day (QID) | ORAL | 0 refills | Status: DC | PRN

## 2023-01-10 NOTE — ED Triage Notes (Signed)
Pt reports lower back and RT hip pain x 2wks; no injury; also reports fatigue

## 2023-01-10 NOTE — Discharge Instructions (Signed)
1.  Start prednisone tomorrow as prescribed.  You are given your first dose in the emergency department.  You should start to experience improvement in about 12 to 24 hours of starting the prednisone.  You may take Vicodin for additional pain control.  When pain is improving significantly you can discontinue the Vicodin and take extra strength Tylenol as needed. 2.  Follow-up with your neurosurgeon as planned. 3.  Return to the emergency department if you have weakness or numbness into the legs difficulty with bowel or bladder function or other concerning new symptoms.

## 2023-01-10 NOTE — ED Provider Notes (Signed)
[place] EMERGENCY DEPARTMENT AT [facility] Provider Note   CSN: [id number] Arrival date & time: 01/10/23  4098     History  Chief Complaint  Patient presents with   Back Pain    Aaron Marquez is a 67 y.o. male.  HPI Patient reports has had problems with back pain off-and-on.  In 2014 he had right-sided L4-L5 laminectomy and discectomy by Dr. Wynetta Emery.  Patient reports he off-and-on gets flares of pain.  He reports he has been having problems over the past several weeks with pain getting more severe over the past several days.  He indicates pain is in the very low back on the right side in the lumbar and SI region.  Pain radiates down to the buttock and hip.  He reports that he can walk and is not weak but the more he walks the more the pain is radiating to the buttock and hip area.  No injury or fall.  He is being treated with tramadol but not getting pain relief.  Abdominal pain.  No bowel no bladder dysfunction.  Patient has scheduled a follow-up appointment with neurosurgery but this is not till the end of the month.    Home Medications Prior to Admission medications   Medication Sig Start Date End Date Taking? Authorizing Provider  HYDROcodone-acetaminophen (NORCO/VICODIN) 5-325 MG tablet Take 1-2 tablets by mouth every 6 (six) hours as needed. 01/10/23  Yes Jaxyn Mestas, Lebron Conners, MD  predniSONE (STERAPRED UNI-PAK 21 TAB) 10 MG (21) TBPK tablet Take by mouth daily. Take 6 tabs by mouth daily  for 1 days, then 5 tabs for 2 days, then 4 tabs for 2 days, then 3 tabs for 2 days, 2 tabs for 2 days, then 1 tab by mouth daily for 2 days 01/10/23  Yes Eitan Doubleday, Lebron Conners, MD  aspirin 81 MG tablet Take 81 mg by mouth daily.    [provider]  atorvastatin (LIPITOR) 80 MG tablet Take 80 mg by mouth daily.    [provider]  COVID-19 mRNA bivalent vaccine, Moderna, (MODERNA COVID-19 BIVAL BOOSTER) 50 MCG/0.5ML injection Inject into the muscle. 07/25/21   Judyann Munson, MD   COVID-19 mRNA vaccine, Moderna, (MODERNA COVID-19 VACCINE) 100 MCG/0.5ML injection Inject into the muscle. 12/27/20   Judyann Munson, MD  methocarbamol (ROBAXIN) 500 MG tablet Take 1 tablet (500 mg total) by mouth 2 (two) times daily. 07/31/19   Khatri, Hillary Bow, PA-C  methylPREDNISolone (MEDROL DOSEPAK) 4 MG TBPK tablet Follow package insert 08/04/22   Curatolo, Adam, DO  metoprolol (LOPRESSOR) 50 MG tablet Take 50 mg by mouth 2 (two) times daily.    [provider]  metoprolol succinate (TOPROL-XL) 50 MG 24 hr tablet Take 50 mg by mouth 2 (two) times daily. Take with or immediately following a meal.    [provider]  omeprazole (PRILOSEC) 20 MG capsule Take 20 mg by mouth 2 (two) times daily.    [provider]  predniSONE (STERAPRED UNI-PAK 21 TAB) 10 MG (21) TBPK tablet Take by mouth daily. Take 6 tabs by mouth daily  for 1 day, then 5 tabs for 1 day, then 4 tabs for 1 day, then 3 tabs for 1 day, 2 tabs for 1 day, then 1 tab by mouth daily for 1 day 12/17/21   Milagros Loll, MD  traMADol (ULTRAM) 50 MG tablet Take 1 tablet (50 mg total) by mouth every 6 (six) hours as needed. 05/08/18   Raeford Razor, MD  Allergies    Lisinopril    Review of Systems   Review of Systems  Physical Exam Updated Vital Signs BP (!) 128/96   Pulse 60   Temp (!) 97.4 F (36.3 C) (Oral)   Resp 20   Ht 5\' 8"  (1.727 m)   Wt 117 kg   SpO2 96%   BMI 39.23 kg/m  Physical Exam Constitutional:      Comments: Alert nontoxic.  Uncomfortable in appearance.  No respiratory distress.  Eyes:     Extraocular Movements: Extraocular movements intact.  Cardiovascular:     Rate and Rhythm: Normal rate and regular rhythm.  Pulmonary:     Effort: Pulmonary effort is normal.     Breath sounds: Normal breath sounds.  Abdominal:     General: There is no distension.     Palpations: Abdomen is soft.     Tenderness: There is no abdominal tenderness. There is no guarding.   Musculoskeletal:     Comments: Patient Dors is pain to palpation at the low lumbar spine at about L5-S1.  No pain at the gluteal cleft.  He endorses pain also over the SI joint region on the right.  No soft tissue abnormalities.  No peripheral edema.  Calves soft and nontender.  Skin:    General: Skin is warm and dry.  Neurological:     Comments: Lower extremity strength 4 flexion extension 5\5.  Patient can hold and elevate right lower extremity.     ED Results / Procedures / Treatments   Labs (all labs ordered are listed, but only abnormal results are displayed) Labs Reviewed  CBC WITH DIFFERENTIAL/PLATELET - Abnormal; Notable for the following components:      Result Value   MCV 79.4 (*)    All other components within normal limits  COMPREHENSIVE METABOLIC PANEL - Abnormal; Notable for the following components:   Glucose, Bld 110 (*)    Creatinine, Ser 1.55 (*)    Calcium 8.7 (*)    GFR, Estimated 49 (*)    All other components within normal limits  URINALYSIS, ROUTINE W REFLEX MICROSCOPIC    EKG EKG Interpretation  Date/Time:  Wednesday January 10 2023 19:17:46 EDT Ventricular Rate:  59 PR Interval:  216 QRS Duration: 76 QT Interval:  408 QTC Calculation: 403 R Axis:   -14 Text Interpretation: Sinus bradycardia with 1st degree A-V block ST & T wave abnormality, consider lateral ischemia Abnormal ECG When compared with ECG of 08-Jul-2022 13:25, PREVIOUS ECG IS PRESENT ST elevation pattern similar to previous. no STEMI Confirmed by Arby Barrette [phone]) on 01/10/2023 7:29:10 PM  Radiology No results found.  Procedures Procedures    Medications Ordered in ED Medications  HYDROmorphone (DILAUDID) injection 1 mg (has no administration in time range)  predniSONE (DELTASONE) tablet 60 mg (60 mg Oral Given 01/10/23 2209)    ED Course/ Medical Decision Making/ A&P                             Medical Decision Making Amount and/or Complexity of Data Reviewed Labs:  ordered.   Patient presents with persistent lower back pain.  He has known history of sporadic chronic low back pain.  He has been experiencing exacerbation over the past several weeks much worse over couple days.  At this point he does have radiculopathy but no weakness or numbness.  Patient has been treated with Toradol without significant relief.  At this time I do  not feel that further imaging is indicated as the patient has no neurologic dysfunction.  Administer Dilaudid 1 mg IV and oral prednisone 60 mg.  Will plan to treat with prednisone and short course of hydrocodone.  Patient already has established follow-up with [facility] neurosurgery.        Final Clinical Impression(s) / ED Diagnoses Final diagnoses:  Acute exacerbation of chronic low back pain  Lumbosacral radiculopathy    Rx / DC Orders ED Discharge Orders          Ordered    predniSONE (STERAPRED UNI-PAK 21 TAB) 10 MG (21) TBPK tablet  Daily        01/10/23 2212    HYDROcodone-acetaminophen (NORCO/VICODIN) 5-325 MG tablet  Every 6 hours PRN        01/10/23 2212              Arby Barrette, MD 01/10/23 2212

## 2023-04-30 ENCOUNTER — Other Ambulatory Visit: Payer: Self-pay

## 2023-04-30 ENCOUNTER — Emergency Department (HOSPITAL_BASED_OUTPATIENT_CLINIC_OR_DEPARTMENT_OTHER): Payer: Medicare HMO

## 2023-04-30 ENCOUNTER — Encounter (HOSPITAL_BASED_OUTPATIENT_CLINIC_OR_DEPARTMENT_OTHER): Payer: Self-pay | Admitting: Emergency Medicine

## 2023-04-30 ENCOUNTER — Emergency Department (HOSPITAL_BASED_OUTPATIENT_CLINIC_OR_DEPARTMENT_OTHER)
Admission: EM | Admit: 2023-04-30 | Discharge: 2023-05-01 | Disposition: A | Discharge status: Home / Self Care | Payer: Medicare HMO | Attending: Emergency Medicine | Admitting: Emergency Medicine

## 2023-04-30 DIAGNOSIS — I10 Essential (primary) hypertension: Secondary | ICD-10-CM | POA: Diagnosis not present

## 2023-04-30 DIAGNOSIS — M545 Low back pain, unspecified: Secondary | ICD-10-CM | POA: Diagnosis not present

## 2023-04-30 DIAGNOSIS — R42 Dizziness and giddiness: Secondary | ICD-10-CM | POA: Insufficient documentation

## 2023-04-30 DIAGNOSIS — R262 Difficulty in walking, not elsewhere classified: Secondary | ICD-10-CM | POA: Insufficient documentation

## 2023-04-30 DIAGNOSIS — R4789 Other speech disturbances: Secondary | ICD-10-CM | POA: Insufficient documentation

## 2023-04-30 DIAGNOSIS — R410 Disorientation, unspecified: Secondary | ICD-10-CM | POA: Insufficient documentation

## 2023-04-30 DIAGNOSIS — Z7982 Long term (current) use of aspirin: Secondary | ICD-10-CM | POA: Insufficient documentation

## 2023-04-30 DIAGNOSIS — I672 Cerebral atherosclerosis: Secondary | ICD-10-CM | POA: Diagnosis not present

## 2023-04-30 DIAGNOSIS — R531 Weakness: Secondary | ICD-10-CM | POA: Insufficient documentation

## 2023-04-30 DIAGNOSIS — R9431 Abnormal electrocardiogram [ECG] [EKG]: Secondary | ICD-10-CM | POA: Insufficient documentation

## 2023-04-30 DIAGNOSIS — Z79899 Other long term (current) drug therapy: Secondary | ICD-10-CM | POA: Insufficient documentation

## 2023-04-30 DIAGNOSIS — R29818 Other symptoms and signs involving the nervous system: Secondary | ICD-10-CM | POA: Diagnosis present

## 2023-04-30 LAB — COMPREHENSIVE METABOLIC PANEL
ALT: 19 U/L (ref 0–44)
AST: 25 U/L (ref 15–41)
Albumin: 3.8 g/dL (ref 3.5–5.0)
Alkaline Phosphatase: 72 U/L (ref 38–126)
Anion gap: 10 (ref 5–15)
BUN: 19 mg/dL (ref 8–23)
CO2: 22 mmol/L (ref 22–32)
Calcium: 8.4 mg/dL — ABNORMAL LOW (ref 8.9–10.3)
Chloride: 106 mmol/L (ref 98–111)
Creatinine, Ser: 1.63 mg/dL — ABNORMAL HIGH (ref 0.61–1.24)
GFR, Estimated: 46 mL/min — ABNORMAL LOW (ref >60)
Glucose, Bld: 103 mg/dL — ABNORMAL HIGH (ref 70–99)
Potassium: 3.8 mmol/L (ref 3.5–5.1)
Sodium: 138 mmol/L (ref 135–145)
Total Bilirubin: 0.9 mg/dL (ref 0.3–1.2)
Total Protein: 6.7 g/dL (ref 6.5–8.1)

## 2023-04-30 LAB — URINALYSIS, W/ REFLEX TO CULTURE (INFECTION SUSPECTED)
Bilirubin Urine: NEGATIVE
Glucose, UA: NEGATIVE mg/dL
Hgb urine dipstick: NEGATIVE
Ketones, ur: NEGATIVE mg/dL
Leukocytes,Ua: NEGATIVE
Nitrite: NEGATIVE
Protein, ur: NEGATIVE mg/dL
RBC / HPF: NONE SEEN RBC/hpf (ref 0–5)
Specific Gravity, Urine: 1.025 (ref 1.005–1.030)
WBC, UA: NONE SEEN WBC/hpf (ref 0–5)
pH: 5.5 (ref 5.0–8.0)

## 2023-04-30 LAB — DIFFERENTIAL
Abs Immature Granulocytes: 0.01 10*3/uL (ref 0.00–0.07)
Basophils Absolute: 0 10*3/uL (ref 0.0–0.1)
Basophils Relative: 1 %
Eosinophils Absolute: 0.1 10*3/uL (ref 0.0–0.5)
Eosinophils Relative: 2 %
Immature Granulocytes: 0 %
Lymphocytes Relative: 35 %
Lymphs Abs: 1.4 10*3/uL (ref 0.7–4.0)
Monocytes Absolute: 0.5 10*3/uL (ref 0.1–1.0)
Monocytes Relative: 11 %
Neutro Abs: 2 10*3/uL (ref 1.7–7.7)
Neutrophils Relative %: 51 %

## 2023-04-30 LAB — CBC
HCT: 44.5 % (ref 39.0–52.0)
Hemoglobin: 14.7 g/dL (ref 13.0–17.0)
MCH: 26.3 pg (ref 26.0–34.0)
MCHC: 33 g/dL (ref 30.0–36.0)
MCV: 79.6 fL — ABNORMAL LOW (ref 80.0–100.0)
Platelets: 166 10*3/uL (ref 150–400)
RBC: 5.59 MIL/uL (ref 4.22–5.81)
RDW: 13.1 % (ref 11.5–15.5)
WBC: 4 10*3/uL (ref 4.0–10.5)
nRBC: 0 % (ref 0.0–0.2)

## 2023-04-30 LAB — APTT: aPTT: 29 seconds (ref 24–36)

## 2023-04-30 LAB — TROPONIN I (HIGH SENSITIVITY)
Troponin I (High Sensitivity): 9 ng/L (ref <18)
Troponin I (High Sensitivity): 10 ng/L (ref <18)

## 2023-04-30 LAB — CBG MONITORING, ED: Glucose-Capillary: 101 mg/dL — ABNORMAL HIGH (ref 70–99)

## 2023-04-30 LAB — PROTIME-INR
INR: 1 (ref 0.8–1.2)
Prothrombin Time: 13 seconds (ref 11.4–15.2)

## 2023-04-30 LAB — ETHANOL: Alcohol, Ethyl (B): <10 mg/dL (ref <10)

## 2023-04-30 MED ORDER — DIPHENHYDRAMINE HCL 50 MG/ML IJ SOLN
12.5000 mg | Freq: Once | INTRAMUSCULAR | Status: AC
  Administered 2023-04-30: 12.5 mg via INTRAVENOUS
  Filled 2023-04-30: qty 1

## 2023-04-30 MED ORDER — SODIUM CHLORIDE 0.9 % IV BOLUS
1000.0000 mL | Freq: Once | INTRAVENOUS | Status: AC
  Administered 2023-04-30: 1000 mL via INTRAVENOUS

## 2023-04-30 MED ORDER — METOCLOPRAMIDE HCL 5 MG/ML IJ SOLN
10.0000 mg | Freq: Once | INTRAMUSCULAR | Status: AC
  Administered 2023-04-30: 10 mg via INTRAVENOUS
  Filled 2023-04-30: qty 2

## 2023-04-30 MED ORDER — IOHEXOL 350 MG/ML SOLN
100.0000 mL | Freq: Once | INTRAVENOUS | Status: AC | PRN
  Administered 2023-04-30: 100 mL via INTRAVENOUS

## 2023-04-30 MED ORDER — SODIUM CHLORIDE 0.9% FLUSH
3.0000 mL | Freq: Once | INTRAVENOUS | Status: DC
  Filled 2023-04-30: qty 3

## 2023-04-30 NOTE — ED Notes (Signed)
Patient transported to CT 

## 2023-04-30 NOTE — ED Provider Notes (Signed)
[facility] Provider Note   CSN: [id number] Arrival date & time: 04/30/23  1829     History  Chief Complaint  Patient presents with   Neurologic Problem    Shandy Porres is a 67 y.o. male history of hypertensive urgency, here presenting with weakness and trouble walking and trouble speaking.  Patient woke up this morning and did not feel well.  Patient has been very forgetful today.  In particular and he went to his church choir and came back and forgot that he went there.  His friends at church noticed that he was different than usual.  His wife also noticed some slow speech and questionable slurred speech.  Patient was admitted back in March at [facility].  Patient was thought to have hypertensive urgency at that time.  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  Patient states that he has not followed up with neurology yet.  The history is provided by the patient.       Home Medications Prior to Admission medications   Medication Sig Start Date End Date Taking? Authorizing Provider  aspirin 81 MG tablet Take 81 mg by mouth daily.    [provider]  atorvastatin (LIPITOR) 80 MG tablet Take 80 mg by mouth daily.    [provider]  COVID-19 mRNA bivalent vaccine, Moderna, (MODERNA COVID-19 BIVAL BOOSTER) 50 MCG/0.5ML injection Inject into the muscle. 07/25/21   Judyann Munson, MD  COVID-19 mRNA vaccine, Moderna, (MODERNA COVID-19 VACCINE) 100 MCG/0.5ML injection Inject into the muscle. 12/27/20   Judyann Munson, MD  HYDROcodone-acetaminophen (NORCO/VICODIN) 5-325 MG tablet Take 1-2 tablets by mouth every 6 (six) hours as needed. 01/10/23   Arby Barrette, MD  methocarbamol (ROBAXIN) 500 MG tablet Take 1 tablet (500 mg total) by mouth 2 (two) times daily. 07/31/19   Khatri, Hillary Bow, PA-C  methylPREDNISolone (MEDROL DOSEPAK) 4 MG TBPK tablet Follow package insert 08/04/22   Curatolo,  Adam, DO  metoprolol (LOPRESSOR) 50 MG tablet Take 50 mg by mouth 2 (two) times daily.    [provider]  metoprolol succinate (TOPROL-XL) 50 MG 24 hr tablet Take 50 mg by mouth 2 (two) times daily. Take with or immediately following a meal.    [provider]  omeprazole (PRILOSEC) 20 MG capsule Take 20 mg by mouth 2 (two) times daily.    [provider]  predniSONE (STERAPRED UNI-PAK 21 TAB) 10 MG (21) TBPK tablet Take by mouth daily. Take 6 tabs by mouth daily  for 1 day, then 5 tabs for 1 day, then 4 tabs for 1 day, then 3 tabs for 1 day, 2 tabs for 1 day, then 1 tab by mouth daily for 1 day 12/17/21   Milagros Loll, MD  predniSONE (STERAPRED UNI-PAK 21 TAB) 10 MG (21) TBPK tablet Take by mouth daily. Take 6 tabs by mouth daily  for 1 days, then 5 tabs for 2 days, then 4 tabs for 2 days, then 3 tabs for 2 days, 2 tabs for 2 days, then 1 tab by mouth daily for 2 days 01/10/23   Arby Barrette, MD  traMADol (ULTRAM) 50 MG tablet Take 1 tablet (50 mg total) by mouth every 6 (six) hours as needed. 05/08/18   Raeford Razor, MD      Allergies    Lisinopril    Review of Systems   Review of Systems  Neurological:  Positive for dizziness and speech  difficulty.  Psychiatric/Behavioral:  Positive for confusion.   All other systems reviewed and are negative.   Physical Exam Updated Vital Signs BP (!) 156/98 (BP Location: Left Arm)   Pulse 73   Temp 97.8 F (36.6 C)   Resp 20   Ht 6' (1.829 m)   Wt 127 kg   SpO2 96%   BMI 37.97 kg/m  Physical Exam Vitals and nursing note reviewed.  Constitutional:      Comments: Chronically ill  HENT:     Head: Normocephalic.     Nose: Nose normal.     Mouth/Throat:     Mouth: Mucous membranes are moist.  Eyes:     Extraocular Movements: Extraocular movements intact.     Pupils: Pupils are equal, round, and reactive to light.  Cardiovascular:     Rate and Rhythm: Normal rate and regular rhythm.     Pulses: Normal  pulses.     Heart sounds: Normal heart sounds.  Pulmonary:     Effort: Pulmonary effort is normal.     Breath sounds: Normal breath sounds.  Abdominal:     General: Abdomen is flat.     Palpations: Abdomen is soft.  Musculoskeletal:        General: Normal range of motion.     Cervical back: Normal range of motion and neck supple.  Skin:    General: Skin is warm.     Capillary Refill: Capillary refill takes less than 2 seconds.  Neurological:     Comments: Patient is slightly slow in his responses.  However I do not appreciate any obvious slurred speech.  No obvious facial droop.  Patient has normal strength and sensation bilateral arms and legs.  Patient has a shuffling gait.  Psychiatric:        Mood and Affect: Mood normal.        Behavior: Behavior normal.     ED Results / Procedures / Treatments   Labs (all labs ordered are listed, but only abnormal results are displayed) Labs Reviewed  CBC - Abnormal; Notable for the following components:      Result Value   MCV 79.6 (*)    All other components within normal limits  COMPREHENSIVE METABOLIC PANEL - Abnormal; Notable for the following components:   Glucose, Bld 103 (*)    Creatinine, Ser 1.63 (*)    Calcium 8.4 (*)    GFR, Estimated 46 (*)    All other components within normal limits  URINALYSIS, W/ REFLEX TO CULTURE (INFECTION SUSPECTED) - Abnormal; Notable for the following components:   Bacteria, UA RARE (*)    All other components within normal limits  CBG MONITORING, ED - Abnormal; Notable for the following components:   Glucose-Capillary 101 (*)    All other components within normal limits  PROTIME-INR  APTT  DIFFERENTIAL  ETHANOL  TROPONIN I (HIGH SENSITIVITY)  TROPONIN I (HIGH SENSITIVITY)    EKG EKG Interpretation Date/Time:  Monday April 30 2023 18:39:51 EDT Ventricular Rate:  70 PR Interval:  207 QRS Duration:  85 QT Interval:  377 QTC Calculation: 407 R Axis:   9  Text  Interpretation: Sinus rhythm Nonspecific T abnormalities, lateral leads No significant change since last tracing Confirmed by Richardean Canal [phone]) on 04/30/2023 7:09:08 PM  Radiology CT HEAD WO CONTRAST  Result Date: 04/30/2023 CLINICAL DATA:  Neuro deficit, acute, stroke suspected for 2 days EXAM: CT HEAD WITHOUT CONTRAST TECHNIQUE: Contiguous axial images were obtained from the base of  the skull through the vertex without intravenous contrast. RADIATION DOSE REDUCTION: This exam was performed according to the departmental dose-optimization program which includes automated exposure control, adjustment of the mA and/or kV according to patient size and/or use of iterative reconstruction technique. COMPARISON:  Head CT 02/11/2023 FINDINGS: Brain: No intracranial hemorrhage, mass effect, or midline shift. No hydrocephalus. The basilar cisterns are patent. No evidence of territorial infarct or acute ischemia. No extra-axial or intracranial fluid collection. Dural calcifications as before. Vascular: No hyperdense vessel or unexpected calcification. Skull: No fracture or focal lesion. Sinuses/Orbits: No acute findings. Tiny mucous retention cysts in the maxillary sinuses. No mastoid effusion. Other: None. IMPRESSION: No acute intracranial abnormality. Electronically Signed   By: Narda Rutherford M.D.   On: 04/30/2023 19:01    Procedures Procedures    Medications Ordered in ED Medications  sodium chloride 0.9 % bolus 1,000 mL (0 mLs Intravenous Stopped 04/30/23 2059)  iohexol (OMNIPAQUE) 350 MG/ML injection 100 mL (100 mLs Intravenous Contrast Given 04/30/23 2004)    ED Course/ Medical Decision Making/ A&P                                 Medical Decision Making Diony Sinibaldi is a 67 y.o. male here presenting with trouble speaking and weakness.  This is likely multifactorial.  Consider electrolyte abnormality versus sepsis versus stroke versus TIA.  Patient had hypertensive urgency previously.  Today his  blood pressure in the 150s and I have low suspicion for intracranial bleeding.  Plan to get CTA head and neck and labs and UA.  11:03 PM Labs unremarkable.  UA is unremarkable.  Initial CT head is normal.  CTA showed no LVO.  I discussed case with Dr. Leeroy Bock from neurology.  He recommend MRI brain and ED to ED transfer.  Dr. Doran Durand to accept.  Patient will be transferred by [facility].  Per neurology, patient may have some parkinsonian features and can follow-up with Dr. Arbutus Leas from neurology outpatient if MRI showed no stroke.   Problems Addressed: Weakness: acute illness or injury  Amount and/or Complexity of Data Reviewed Labs: ordered. Decision-making details documented in ED Course. Radiology: ordered and independent interpretation performed. Decision-making details documented in ED Course. ECG/medicine tests: ordered and independent interpretation performed. Decision-making details documented in ED Course.  Risk Prescription drug management.    Final Clinical Impression(s) / ED Diagnoses Final diagnoses:  None    Rx / DC Orders ED Discharge Orders     None         Charlynne Pander, MD 04/30/23 2304

## 2023-04-30 NOTE — ED Triage Notes (Signed)
Pt to ER with wife with c/o neurologic problems since March when he spent several days in the hospital.  States for the last 1-2 days has had "weight" on his face, has been slow in his speech and has had some memory deficits.

## 2023-04-30 NOTE — ED Notes (Signed)
Pt. Passed swallow screen without coughing/choking

## 2023-04-30 NOTE — ED Notes (Signed)
Gave report to Darryll Capers, RN at [facility] ED.

## 2023-05-01 ENCOUNTER — Emergency Department (HOSPITAL_COMMUNITY): Payer: Medicare HMO

## 2023-05-01 DIAGNOSIS — Z7982 Long term (current) use of aspirin: Secondary | ICD-10-CM | POA: Diagnosis not present

## 2023-05-01 DIAGNOSIS — R531 Weakness: Secondary | ICD-10-CM | POA: Diagnosis present

## 2023-05-01 MED ORDER — OXYCODONE-ACETAMINOPHEN 5-325 MG PO TABS
2.0000 | ORAL_TABLET | Freq: Once | ORAL | Status: AC
  Administered 2023-05-01: 2 via ORAL
  Filled 2023-05-01: qty 2

## 2023-05-01 MED ORDER — LORAZEPAM 2 MG/ML IJ SOLN
1.0000 mg | Freq: Once | INTRAMUSCULAR | Status: AC | PRN
  Administered 2023-05-01: 1 mg via INTRAVENOUS
  Filled 2023-05-01: qty 1

## 2023-05-01 MED ORDER — LIDOCAINE 5 % EX PTCH
1.0000 | MEDICATED_PATCH | CUTANEOUS | Status: DC
  Administered 2023-05-01: 1 via TRANSDERMAL
  Filled 2023-05-01: qty 1

## 2023-05-01 NOTE — ED Notes (Signed)
Patient arrived from [facility].

## 2023-05-01 NOTE — ED Notes (Signed)
[facility] on the floor, reports & paperwork given, pt belongings bagged, labeled and sent with pt

## 2023-05-01 NOTE — ED Provider Notes (Addendum)
1:20 AM Patient presenting in transfer from [facility] for MRI brain. Presenting c/o was of weakness with trouble walking and difficulty speaking. Had some forgetfulness/confusion. CT head and CTA negative. Dr. Leeroy Bock of neurology consulted who recommended transfer for MRI. If MRI negative, anticipatory discharge with outpatient follow-up with Dr. Arbutus Leas of [facility].   Patient with history of chronic low back pain.  Complaining of back pain upon arrival.  Given oral Percocet as well as a Lidoderm patch.  IV Ativan ordered once PRN for MRI sedation.  3:40 AM MRI in ED today negative for acute CVA. Will refer to outpatient neurology for follow up. Plan relayed to patient who verbalizes understanding.  3:54 AM Patient with some hypoxia when sleeping. Normal O2 sats when awake. Obese AA male. Suspect decreased respiratory drive when asleep post Percocet and Ativan. Plan to ambulate on pulse ox w/o use of supplemental O2.  4:07 AM Sats 97% when awake and ambulatory. Still sleepy from Ativan. Will obs in ED pending safe ride for discharge. Wife planning to pick up patient later this AM.   MR BRAIN WO CONTRAST  Result Date: 05/01/2023 CLINICAL DATA:  Initial evaluation for acute TIA. EXAM: MRI HEAD WITHOUT CONTRAST TECHNIQUE: Multiplanar, multiecho pulse sequences of the brain and surrounding structures were obtained without intravenous contrast. COMPARISON:  Prior CTs from 04/30/2023. FINDINGS: Brain: Cerebral volume within normal limits. No significant cerebral white matter disease for age. No evidence for acute or subacute ischemia. Gray-white matter vision maintained. Norris of chronic cortical infarction. No acute or chronic intracranial blood products. No mass lesion, midline shift or mass effect. No hydrocephalus or extra-axial fluid collection. Pituitary gland and suprasellar region within normal limits. Vascular: Major intracranial vascular flow voids are maintained. Skull and upper cervical  spine: Craniocervical junction within normal limits. Decreased T1 signal intensity seen within the bone marrow the visualized upper cervical spine, nonspecific, but most commonly related to anemia, smoking or obesity. No focal marrow replacing lesion. No scalp soft tissue abnormality. Sinuses/Orbits: Globes and orbital soft tissues demonstrate no acute finding. Prior bilateral ocular lens replacement. Few small retention cysts noted about the maxillary sinuses. Paranasal sinuses are otherwise largely clear. No significant mastoid effusion. Other: None. IMPRESSION: Normal brain MRI for age. No acute intracranial abnormality identified. Electronically Signed   By: Rise Mu M.D.   On: 05/01/2023 03:35    Antony Madura, PA-C 05/01/23 0345    Antony Madura, PA-C 05/01/23 0407    Glynn Octave, MD 05/01/23 [phone]

## 2023-05-01 NOTE — ED Notes (Signed)
Pt walked with pulse ox and was at 97% the whole time

## 2023-05-01 NOTE — ED Notes (Signed)
Patient to MRI.

## 2023-05-01 NOTE — ED Notes (Signed)
Patient back from MRI VS obtained

## 2023-05-01 NOTE — ED Notes (Signed)
Pt is a Transfer from [facility] for an MRI  to rule out CVA and admittance.  Pt has been slower to respond for past 2 days and Wife states has had some memory issues

## 2023-05-01 NOTE — Discharge Instructions (Addendum)
Your evaluation in the emergency department today has been reassuring.  We have provided a referral to neurology for outpatient follow-up.  You have been referred specifically to Dr. Arbutus Leas given your presenting complaints.  Continue your daily prescribed medications.  In the interim, follow-up with your primary care doctor.  You may return for new or concerning symptoms.

## 2023-05-09 NOTE — Progress Notes (Signed)
PT is part of stroke order set

## 2023-06-21 ENCOUNTER — Ambulatory Visit (INDEPENDENT_AMBULATORY_CARE_PROVIDER_SITE_OTHER): Payer: Medicare HMO | Admitting: Neurology

## 2023-06-21 ENCOUNTER — Encounter: Payer: Self-pay | Admitting: Neurology

## 2023-06-21 VITALS — BP 158/84 | HR 72 | Ht 68.0 in | Wt 266.0 lb

## 2023-06-21 DIAGNOSIS — I6783 Posterior reversible encephalopathy syndrome: Secondary | ICD-10-CM

## 2023-06-21 DIAGNOSIS — I169 Hypertensive crisis, unspecified: Secondary | ICD-10-CM

## 2023-06-21 DIAGNOSIS — G252 Other specified forms of tremor: Secondary | ICD-10-CM | POA: Diagnosis not present

## 2023-06-21 DIAGNOSIS — G319 Degenerative disease of nervous system, unspecified: Secondary | ICD-10-CM | POA: Insufficient documentation

## 2023-06-21 MED ORDER — CARBIDOPA-LEVODOPA 25-100 MG PO TABS: 1.0000 | ORAL_TABLET | Freq: Three times a day (TID) | ORAL | 2 refills | Status: DC

## 2023-06-21 NOTE — Progress Notes (Addendum)
SLEEP MEDICINE CLINIC    Provider:  Melvyn Novas, MD    Primary Care Physician: switching to Dr Oliver Barre , MD    Kallie Locks, FNP [address] [place] [address]     Referring Provider: Michaela Corner, Vonzell Schlatter, Np [address] [place],  [place] [address]          Chief Complaint according to patient   Patient presents with:     New Consult for Patient (Initial Visit) MEMORY referral-  had been waiting for an [facility]  or [facility]rologist for 8 months . Was referred in April to Neurology- no appointment was made, was seen in ED in summer again, and referred to Dr Tat.            HISTORY OF PRESENT ILLNESS:  Aaron Marquez is a 67 y.o. male patient who is seen upon referral on 06/21/2023 from [facility] Primary care provider : for unspecified  memory concern. There was no previous MMSE documented -it is unclear what cognitive areas/  functions are impaired and how independent this patient is in daily life.   This patient has an extensive medical record that I reviewed over the last 2 days.  He is referred for memory changes but there has been an acute change overall in his cognitive function his ability to retain information since February of this year.  His wife ,Aaron Marquez , accompanied the patient today to this visit.   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  She reports that he shuffles ongoing for  years - due to long standing history of  back pain and back surgeries that have not helped. Chronic pain.    Theyre have been short-term memory struggles ongoing:  forgetting where certain objects have been placed, sometimes repeating questions over and over, and she is also reporting that he still has some pressure  headaches on the top of his head.  Sometimes she states that his hands shake at rest- left more than right .  All these symptoms were supposingly not present prior to the fall. Several months ago, he underwent cataract surgery and " hasn't been seeing well since" . He has disposed of his glasses and now is unable to read !     Chief concern according to patient :  " I am not right since I came back from the hospital"    I have the pleasure of seeing Aaron Marquez 06/21/23 a right-handed male with a possible sleep disorder.     Family medical  history:  no family history of dementia  - verified by 2 older sisters, who are fit.  Social history:  Patient is married,  retired from [employer] in [place], and lives in a household with spouse. Adult children, stepchildren , grandchildren.   Tobacco use: none .  ETOH use : none,  Caffeine intake in form of Coffee( /) Soda( 2 a day) Tea ( arnold palmer 2 a day) or energy drinks. No exercise .      Review of Systems: Out of a complete 14 system review, the patient complains of only the following symptoms, and all other reviewed systems are negative.:   weakness  Social History   Socioeconomic History   Marital status: Married    Spouse name: Not on file   Number of children: Not on file   Years of education: Not on file   Highest education level: Not on file  Occupational History   Not on file  Tobacco Use   Smoking status: Never   Smokeless tobacco: Never  Substance and Sexual Activity   Alcohol use: No   Drug use: No   Sexual activity: Not on file  Other Topics Concern   Not on file  Social History Narrative   Not on file   Social Determinants of Health   Financial Resource Strain: Low Risk  (04/03/2023)   [facility]   Overall Financial Resource Strain (CARDIA)    Difficulty of Paying Living Expenses: Not hard at all  Food Insecurity: No Food Insecurity (04/03/2023)   Received from [facility]   Hunger  Vital Sign    Worried About Running Out of Food in the Last Year: Never true    Ran Out of Food in the Last Year: Never true  Transportation Needs: No Transportation Needs (04/03/2023)   Received from [facility] - Transportation    Lack of Transportation (Medical): No    Lack of Transportation (Non-Medical): No  Physical Activity: Not on file  Stress: Not on file  Social Connections: Unknown (02/12/2023)   Received from [facility]   Social Network    Social Network: Not on file    No family history on file.  Past Medical History:  Diagnosis Date   Back pain    GERD (gastroesophageal reflux disease)    Gout    Hypertension    Lumbar hernia    Pneumonia 07/31/12    Past Surgical History:  Procedure Laterality Date   BACK SURGERY     FERTILITY SURGERY     stent   LUMBAR LAMINECTOMY/DECOMPRESSION MICRODISCECTOMY Right 01/01/2013   Procedure: LUMBAR LAMINECTOMY/DECOMPRESSION MICRODISCECTOMY 1 LEVEL;  Surgeon: Mariam Dollar, MD;  Location: [facility];  Service: Neurosurgery;  Laterality: Right;  Lumbar Laminectomies Lumbar Four-Five, Decompression, Discectomy      Current Outpatient Medications on File Prior to Visit  Medication Sig Dispense Refill   aspirin 81 MG tablet Take 81 mg by mouth daily.     atorvastatin (LIPITOR) 80 MG tablet Take 80 mg by mouth daily.     COVID-19 mRNA bivalent vaccine, Moderna, (MODERNA COVID-19 BIVAL BOOSTER) 50 MCG/0.5ML injection Inject into the muscle. 0.5 mL 0   COVID-19 mRNA vaccine, Moderna, (MODERNA COVID-19 VACCINE) 100 MCG/0.5ML injection Inject into the muscle. 0.3 mL 0   HYDROcodone-acetaminophen (NORCO/VICODIN) 5-325 MG tablet Take 1-2 tablets by mouth every 6 (six) hours as needed. 20 tablet 0   methocarbamol (ROBAXIN) 500 MG tablet Take 1 tablet (500 mg total) by mouth 2 (two) times daily. 20 tablet 0   methylPREDNISolone (MEDROL DOSEPAK) 4 MG TBPK tablet Follow package insert 21 each 0   metoprolol (LOPRESSOR) 50 MG  tablet Take 50 mg by mouth 2 (two) times daily.     metoprolol succinate (TOPROL-XL) 50 MG 24 hr tablet Take 50 mg by mouth 2 (two) times daily. Take with or immediately following a meal.     omeprazole (PRILOSEC) 20 MG capsule Take 20 mg by mouth 2 (two) times daily.     predniSONE (STERAPRED UNI-PAK 21 TAB) 10 MG (21) TBPK tablet Take by mouth daily. Take 6 tabs by mouth daily  for 1 day, then 5 tabs for 1 day, then 4 tabs for 1 day, then 3 tabs for 1 day, 2 tabs for 1 day, then 1 tab by mouth daily for 1 day 21 tablet 0   predniSONE (STERAPRED UNI-PAK 21 TAB) 10 MG (21) TBPK tablet Take by mouth daily. Take 6 tabs by mouth daily  for 1 days, then 5 tabs for 2 days, then 4 tabs for 2 days, then 3 tabs for 2 days, 2 tabs for 2 days, then 1 tab by mouth daily for 2 days 42 tablet 0   traMADol (ULTRAM) 50 MG tablet Take 1 tablet (50 mg total) by mouth every 6 (six) hours as needed. 8 tablet 0   No current facility-administered medications on file prior to visit.    Allergies  Allergen Reactions   Lisinopril Nausea Only and Other (See Comments)    "Makes me gag" "Makes me Gag" Tingling in Throat Tingling in throat      DIAGNOSTIC DATA (LABS, IMAGING, TESTING) - I reviewed patient records, labs, notes, testing and imaging myself where available.  Lab Results  Component Value Date   WBC 4.0 04/30/2023   HGB 14.7 04/30/2023   HCT 44.5 04/30/2023   MCV 79.6 (L) 04/30/2023   PLT 166 04/30/2023      Component Value Date/Time   NA 138 04/30/2023 1846   K 3.8 04/30/2023 1846   CL 106 04/30/2023 1846   CO2 22 04/30/2023 1846   GLUCOSE 103 (H) 04/30/2023 1846   BUN 19 04/30/2023 1846   CREATININE 1.63 (H) 04/30/2023 1846   CALCIUM 8.4 (L) 04/30/2023 1846   PROT 6.7 04/30/2023 1846   ALBUMIN 3.8 04/30/2023 1846   AST 25 04/30/2023 1846   ALT 19 04/30/2023 1846   ALKPHOS 72 04/30/2023 1846   BILITOT 0.9 04/30/2023 1846   GFRNONAA 46 (L) 04/30/2023 1846   GFRAA 45 (L) 06/01/2018 0036    No results found for: "CHOL", "HDL", "LDLCALC", "LDLDIRECT", "TRIG", "CHOLHDL" No results found for: "HGBA1C" No results found for: "VITAMINB12" No results found for: "TSH"  PHYSICAL EXAM:  Today's Vitals   06/21/23 1346  BP: (!) 158/84  Pulse: 72  Weight: 266 lb (120.7 kg)  Height: 5\' 8"  (1.727 m)   Body mass index is 40.45 kg/m.   Wt Readings from Last 3 Encounters:  06/21/23 266 lb (120.7 kg)  04/30/23 280 lb (127 kg)  01/10/23 258 lb (117 kg)     Ht Readings from Last 3 Encounters:  06/21/23 5\' 8"  (1.727 m)  04/30/23 6' (1.829 m)  01/10/23 5\' 8"  (1.727 m)      General: The patient is awake, alert and appears not in acute distress. The patient is well groomed. He has a masked face, a left hand resting tremor.   Head: Skin:  Without evidence of ankle edema, or rash. Trunk: The patient's posture is slightly stooped.    NEUROLOGIC EXAM: The patient is awake and alert, oriented to place and time.   Memory subjective described as intact. Wide is concerned about STM.  Attention span & concentration ability appears limited.  Speech is slow-fluent, without cadence, monotonous, un modulated  dysphonia   Mood and affect are aloof.    Cranial nerves: Pupils are equal post cataract un- reactive to light.  " Lazy eye on the left" Extraocular movements in vertical and horizontal planes were intact and without nystagmus. No Diplopia. Visual fields by finger perimetry are intact. Hearing was intact to soft  voice and finger rubbing.    Facial sensation intact to fine touch.  Facial motor strength is symmetric and tongue and uvula move midline.  Again, masked face  Neck ROM : rotation, tilt and flexion extension were normal for age and shoulder shrug was symmetrical.    Motor exam:  Symmetric bulk Elevated  waxy tone without cog wheeling, symmetric loss of grip strength .   Sensory:  Fine touch,and vibration were reduced at the ankles, there was loss of propio-ception,   pronator drift.    Coordination: Alternating movements in the fingers/hands were of severely slowed speed.   The Finger-to-nose maneuver was so slow (!) but  without evidence of ataxia, dysmetria or tremor.   Gait and station:  deferred.  Deep tendon reflexes: in the  upper and lower extremities are symmetric and intact.  Babinski response was deferred.   ASSESSMENT AND PLAN:  67 y.o. year old male  patient with a cognitive decline since Epsiode of hypertensive crisis in 09-2022 ( [facility] ) and weeklong hospitalization- according to his wife , everything changed from there on.   here with:    1) severely neuro-cognitively impaired, slowed, masked face, resting tremor in the left hand, the non-dominant site. MOCA 18/ 30 points. MRI - no stroke or major atrophy, reportedly PRS was suspected in 09-3032 at [facility]. CT angio normal   2) denies visual hallucinations, delusions or delirium- no substance abuse history.  He reports history of acting out dreams, to a point where his wife did not want to share the bed any longer.  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   EEG, Sinemet, repeat MMSE next visit. [facility] " dementia " panel    I plan to follow up either personally or through our NP within 3 months.   I would like to thank Kallie Locks, FNP and Norberto Sorenson, Np [address] [place],  [place] [address] for allowing me to meet with and to take care of this pleasant patient.    After spending a total time of  60  minutes face to face and additional time for physical and neurologic examination, review of laboratory studies,  personal review of imaging studies, reports and results of other testing and review of referral information /  records as far as provided in visit,   Electronically signed by: Melvyn Novas, MD 06/21/2023 2:09 PM  [facility] Board certified by The [employer] of Sleep Medicine and Diplomate of the [employer] of Sleep Medicine. Board certified In Neurology through the ABPN, Fellow of the [employer] of Neurology.

## 2023-06-21 NOTE — Progress Notes (Deleted)
[facility]     Provider:  Melvyn Novas, MD      Primary Care Physician: switching to Dr Oliver Barre , MD     Kallie Locks, FNP [address] [place] [address]      Referring Provider: Michaela Corner, Vonzell Schlatter, Np [address] [place],  [place] [address]             Chief Complaint according to patient   Patient presents with:      New Consult for Patient (Initial Visit) MEMORY referral-  had been waiting for an [facility]  or [facility]ist for 8 months              HISTORY OF PRESENT ILLNESS:  Aaron Marquez is a 67 y.o. male patient who is seen upon referral on 06/21/2023 from [place] for a memory evaluation.  This patient has an extensive medical records that I reviewed over the last 2 days.  He is referred for memory changes but there has been an acute change overall in his cognitive function his ability to retain information since February of this year.  His wife Aaron Marquez accompanied the patient today to this visit.  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  he shuffles , but this was present for months or years - long standing history of  back pain.  They have been short-term memory struggles ongoing forgetting where certain objects have been placed, sometimes repeating questions over and over, and she is also reporting that he still has some pressure headaches on the top of his head.  Sometimes she states that his hands shake at rest- left more than right .   All these symptoms were supposingly not present prior to the fall. Several months ago, he underwent cataract surgery and " hasn't been seeing well since" /      Chief concern according to patient :  " I am not right since I came back from the hospital"    I  have the pleasure of seeing Aaron Marquez 06/21/23 a right-handed male with a possible sleep disorder.     Family medical  history:  no family history of dementia  - verified by 2 older sisters, who are fit.  Social history:  Patient is married,  retired from [employer] in [place], and lives in a household with spouse. Adult children, stepchildren , grandchildren.    Tobacco use: none .  ETOH use : none,  Caffeine intake in form of Coffee( /) Soda( 2 a day) Tea ( arnold palmer 2 a day) or energy drinks. No exercise .      Review of Systems: Out of a complete 14 system review, the patient complains of only the following symptoms, and all other reviewed systems are negative.:      Social History         Socioeconomic History   Marital status: Married      Spouse name: Not on file   Number of children: Not on file   Years of education: Not on file   Highest education level: Not on file  Occupational History   Not on file  Tobacco Use   Smoking status: Never   Smokeless tobacco:  Never  Substance and Sexual Activity   Alcohol use: No   Drug use: No   Sexual activity: Not on file  Other Topics Concern   Not on file  Social History Narrative   Not on file    Social Determinants of Health        Financial Resource Strain: Low Risk  (04/03/2023)    Received from [facility]    Overall Financial Resource Strain (CARDIA)     Difficulty of Paying Living Expenses: Not hard at all  Food Insecurity: No Food Insecurity (04/03/2023)    Received from [facility]    Hunger Vital Sign     Worried About Running Out of Food in the Last Year: Never true     Ran Out of Food in the Last Year: Never true  Transportation Needs: No Transportation Needs (04/03/2023)    Received from [facility] - Transportation     Lack of Transportation (Medical): No     Lack of Transportation (Non-Medical): No  Physical Activity: Not on file  Stress: Not on file  Social Connections: Unknown  (02/12/2023)    Received from [facility]    Social Network     Social Network: Not on file      No family history on file.           Past Medical History:  Diagnosis Date   Back pain     GERD (gastroesophageal reflux disease)     Gout     Hypertension     Lumbar hernia     Pneumonia 07/31/12               Past Surgical History:  Procedure Laterality Date   BACK SURGERY       FERTILITY SURGERY        stent   LUMBAR LAMINECTOMY/DECOMPRESSION MICRODISCECTOMY Right 01/01/2013    Procedure: LUMBAR LAMINECTOMY/DECOMPRESSION MICRODISCECTOMY 1 LEVEL;  Surgeon: Mariam Dollar, MD;  Location: [facility];  Service: Neurosurgery;  Laterality: Right;  Lumbar Laminectomies Lumbar Four-Five, Decompression, Discectomy           Medications Ordered Prior to Encounter        Current Outpatient Medications on File Prior to Visit  Medication Sig Dispense Refill   aspirin 81 MG tablet Take 81 mg by mouth daily.       atorvastatin (LIPITOR) 80 MG tablet Take 80 mg by mouth daily.       COVID-19 mRNA bivalent vaccine, Moderna, (MODERNA COVID-19 BIVAL BOOSTER) 50 MCG/0.5ML injection Inject into the muscle. 0.5 mL 0   COVID-19 mRNA vaccine, Moderna, (MODERNA COVID-19 VACCINE) 100 MCG/0.5ML injection Inject into the muscle. 0.3 mL 0   HYDROcodone-acetaminophen (NORCO/VICODIN) 5-325 MG tablet Take 1-2 tablets by mouth every 6 (six) hours as needed. 20 tablet 0   methocarbamol (ROBAXIN) 500 MG tablet Take 1 tablet (500 mg total) by mouth 2 (two) times daily. 20 tablet 0   methylPREDNISolone (MEDROL DOSEPAK) 4 MG TBPK tablet Follow package insert 21 each 0   metoprolol (LOPRESSOR) 50 MG tablet Take 50 mg by mouth 2 (two) times daily.       metoprolol succinate (TOPROL-XL) 50 MG 24 hr tablet Take 50 mg by mouth 2 (two) times daily. Take with or immediately following a meal.       omeprazole (PRILOSEC) 20 MG capsule Take 20 mg by mouth 2 (two) times daily.       predniSONE (STERAPRED UNI-PAK 21 TAB)  10 MG (21) TBPK tablet Take by mouth daily. Take 6 tabs by mouth daily  for 1 day, then 5 tabs for 1 day, then 4 tabs for 1 day, then 3 tabs for 1 day, 2 tabs for 1 day, then 1 tab by mouth daily for 1 day 21 tablet 0   predniSONE (STERAPRED UNI-PAK 21 TAB) 10 MG (21) TBPK tablet Take by mouth daily. Take 6 tabs by mouth daily  for 1 days, then 5 tabs for 2 days, then 4 tabs for 2 days, then 3 tabs for 2 days, 2 tabs for 2 days, then 1 tab by mouth daily for 2 days 42 tablet 0   traMADol (ULTRAM) 50 MG tablet Take 1 tablet (50 mg total) by mouth every 6 (six) hours as needed. 8 tablet 0    No current facility-administered medications on file prior to visit.        Allergies       Allergies  Allergen Reactions   Lisinopril Nausea Only and Other (See Comments)      "Makes me gag" "Makes me Gag" Tingling in Throat Tingling in throat           DIAGNOSTIC DATA (LABS, IMAGING, TESTING) - I reviewed patient records, labs, notes, testing and imaging myself where available.   Recent Labs       Lab Results  Component Value Date    WBC 4.0 04/30/2023    HGB 14.7 04/30/2023    HCT 44.5 04/30/2023    MCV 79.6 (L) 04/30/2023    PLT 166 04/30/2023      Labs (Brief)          Component Value Date/Time    NA 138 04/30/2023 1846    K 3.8 04/30/2023 1846    CL 106 04/30/2023 1846    CO2 22 04/30/2023 1846    GLUCOSE 103 (H) 04/30/2023 1846    BUN 19 04/30/2023 1846    CREATININE 1.63 (H) 04/30/2023 1846    CALCIUM 8.4 (L) 04/30/2023 1846    PROT 6.7 04/30/2023 1846    ALBUMIN 3.8 04/30/2023 1846    AST 25 04/30/2023 1846    ALT 19 04/30/2023 1846    ALKPHOS 72 04/30/2023 1846    BILITOT 0.9 04/30/2023 1846    GFRNONAA 46 (L) 04/30/2023 1846    GFRAA 45 (L) 06/01/2018 0036      Recent Labs  No results found for: "CHOL", "HDL", "LDLCALC", "LDLDIRECT", "TRIG", "CHOLHDL"   Recent Labs  No results found for: "HGBA1C"   Recent Labs  No results found for: "VITAMINB12"   Recent  Labs  No results found for: "TSH"     PHYSICAL EXAM:      Today's Vitals    06/21/23 1346  BP: (!) 158/84  Pulse: 72  Weight: 266 lb (120.7 kg)  Height: 5\' 8"  (1.727 m)    Body mass index is 40.45 kg/m.       Wt Readings from Last 3 Encounters:  06/21/23 266 lb (120.7 kg)  04/30/23 280 lb (127 kg)  01/10/23 258 lb (117 kg)         Ht Readings from Last 3 Encounters:  06/21/23 5\' 8"  (1.727 m)  04/30/23 6' (1.829 m)  01/10/23 5\' 8"  (1.727 m)      General: The patient is awake, alert and appears not in acute distress. The patient is well groomed. Head: Normocephalic, atraumatic. Neck is supple. Mallampati 3,  neck circumference:19 inches . Nasal airflow  patent.  Dental status: biological  Cardiovascular:  Regular rate and cardiac rhythm by pulse,  without distended neck veins. Respiratory: Lungs are clear to auscultation.  Skin:  Without evidence of ankle edema, or rash. Trunk: The patient's posture is erect.   NEUROLOGIC EXAM: The patient is awake and alert, oriented to place and time.   Memory subjective described as intact.  Attention span & concentration ability appears normal.   mini-mental status exam    06/21/2023    2:01 PM  Montreal Cognitive Assessment   Visuospatial/ Executive (0/5) 0  Naming (0/3) 3  Attention: Read list of digits (0/2) 1  Attention: Read list of letters (0/1) 1  Attention: Serial 7 subtraction starting at 100 (0/3) 1  Language: Repeat phrase (0/2) 0  Language : Fluency (0/1) 1  Abstraction (0/2) 2  Delayed Recall (0/5) 0  Orientation (0/6) 6  Total 15    After I loaned the patient reading glasses, he was able to perform a clock draw and trail making test. 18/ 24 points.   Speech is slowed fluent, loss of cadence , dysphonia  Mood and affect are appropriate.   Cranial nerves: no loss of smell or taste reported  Pupils are status post cataract surgery- nearly un-reactive to light. Funduscopic exam deferred. " Lazy eye ,with  esotropia ". No diplopia.  He appears to look surprised.  Extraocular movements in vertical and horizontal planes were intact and without nystagmus. No Diplopia. Visual fields by finger perimetry are intact. Hearing was impaired  to soft voice and finger rubbing.    Facial sensation intact to fine touch.  Facial motor strength is symmetric and tongue and uvula move midline. The patient appears to have lost mimic, his face  is masked, rare blink.   Neck ROM : rotation, tilt and flexion extension were normal for age and shoulder shrug was symmetrical.    Motor exam:  Symmetric bulk, tone .   Waxy elevated tone  in both biceps without cog wheeling, symmetrically reduced  grip strength, slowed movements. Weakness of hip flexion, adduction and abduction.   Sensory:  Fine touch and vibration were intact to the mid calf, trace of vibration sense only at the ankles.  Proprioception tested in the upper extremities was normal. Coordination:  Rapid alternating movements in the fingers/hands were of severely reduced speed.  The Finger-to-nose maneuver was very slow without evidence of ataxia, dysmetria or tremor. Gait and station: Patient cannot rise unassisted from a seated position, walked few steps without assistive device, but stooped.  Deep tendon reflexes: in the  upper and lower extremities are absent , not even traces.    I reviewed the radiology notes, hospital notes and EEG . 05-01-2023: MRI normal brain , Cy angio and CT head.   In February he was diagnosed with PRES (!) and extreme BP of 200 systolic.  Vision was supposingly not impaired until cataract surgery (spouse)   I reviewed multiple ED visit notes.  No memory test was performed by PCP before this referral. The patient reports vivid dreams and RLS in the past, not now.     Starting from scratch:      06/21/2023    2:01 PM  Montreal Cognitive Assessment   Visuospatial/ Executive (0/5) 0  Naming (0/3) 3  Attention: Read list  of digits (0/2) 1  Attention: Read list of letters (0/1) 1  Attention: Serial 7 subtraction starting at 100 (0/3) 1  Language: Repeat phrase (0/2) 0  Language :  Fluency (0/1) 1  Abstraction (0/2) 2  Delayed Recall (0/5) 0  Orientation (0/6) 6  Total 15    Severe neurocognitive impairment  with visual  limitations, resting tremor on the left hand, masked face and inability to walk at baseline.   Starting Sinemet  tid for medication - 25/ 100 mg tid po.   Neurocognitive lab  panel ordered .  May consider DAT scan in the future.   EEG   Rv in 3 - 4 months with me - MMSE when ever MOCA is lower than 22/ 30 points .     Melvyn Novas, MD

## 2023-06-21 NOTE — Patient Instructions (Signed)

## 2023-06-27 LAB — COMPREHENSIVE METABOLIC PANEL
ALT: 19 IU/L (ref 0–44)
AST: 22 IU/L (ref 0–40)
Albumin: 4.3 g/dL (ref 3.9–4.9)
Alkaline Phosphatase: 81 IU/L (ref 44–121)
BUN/Creatinine Ratio: 10 (ref 10–24)
BUN: 19 mg/dL (ref 8–27)
Bilirubin Total: 0.8 mg/dL (ref 0.0–1.2)
CO2: 24 mmol/L (ref 20–29)
Calcium: 9 mg/dL (ref 8.6–10.2)
Chloride: 105 mmol/L (ref 96–106)
Creatinine, Ser: 1.93 mg/dL — ABNORMAL HIGH (ref 0.76–1.27)
Globulin, Total: 2.3 g/dL (ref 1.5–4.5)
Glucose: 86 mg/dL (ref 70–99)
Potassium: 3.7 mmol/L (ref 3.5–5.2)
Sodium: 144 mmol/L (ref 134–144)
Total Protein: 6.6 g/dL (ref 6.0–8.5)
eGFR: 38 mL/min/{1.73_m2} — ABNORMAL LOW (ref >59)

## 2023-06-27 LAB — CBC WITH DIFFERENTIAL/PLATELET
Basophils Absolute: 0 10*3/uL (ref 0.0–0.2)
Basos: 1 %
EOS (ABSOLUTE): 0.1 10*3/uL (ref 0.0–0.4)
Eos: 2 %
Hematocrit: 47.5 % (ref 37.5–51.0)
Hemoglobin: 15.6 g/dL (ref 13.0–17.7)
Immature Grans (Abs): 0 10*3/uL (ref 0.0–0.1)
Immature Granulocytes: 0 %
Lymphocytes Absolute: 1.3 10*3/uL (ref 0.7–3.1)
Lymphs: 32 %
MCH: 26.6 pg (ref 26.6–33.0)
MCHC: 32.8 g/dL (ref 31.5–35.7)
MCV: 81 fL (ref 79–97)
Monocytes Absolute: 0.3 10*3/uL (ref 0.1–0.9)
Monocytes: 8 %
Neutrophils Absolute: 2.4 10*3/uL (ref 1.4–7.0)
Neutrophils: 57 %
Platelets: 152 10*3/uL (ref 150–450)
RBC: 5.87 x10E6/uL — ABNORMAL HIGH (ref 4.14–5.80)
RDW: 12.2 % (ref 11.6–15.4)
WBC: 4.1 10*3/uL (ref 3.4–10.8)

## 2023-06-27 LAB — ATN PROFILE
A -- Beta-amyloid 42/40 Ratio: 0.126 (ref >0.102)
Beta-amyloid 40: 176.13 pg/mL
Beta-amyloid 42: 22.19 pg/mL
N -- NfL, Plasma: 2.08 pg/mL (ref 0.00–4.61)
T -- p-tau181: 1.74 pg/mL — ABNORMAL HIGH (ref 0.00–0.97)

## 2023-06-27 LAB — PROTEIN ELECTROPHORESIS, SERUM
A/G Ratio: 1.3 (ref 0.7–1.7)
Albumin ELP: 3.7 g/dL (ref 2.9–4.4)
Alpha 1: 0.2 g/dL (ref 0.0–0.4)
Alpha 2: 0.7 g/dL (ref 0.4–1.0)
Beta: 0.9 g/dL (ref 0.7–1.3)
Gamma Globulin: 1 g/dL (ref 0.4–1.8)
Globulin, Total: 2.9 g/dL (ref 2.2–3.9)

## 2023-06-27 LAB — HEMOGLOBIN A1C
Est. average glucose Bld gHb Est-mCnc: 120 mg/dL
Hgb A1c MFr Bld: 5.8 % — ABNORMAL HIGH (ref 4.8–5.6)

## 2023-06-27 LAB — RPR: RPR Ser Ql: NONREACTIVE

## 2023-06-27 LAB — SEDIMENTATION RATE: Sed Rate: 5 mm/h (ref 0–30)

## 2023-06-27 LAB — VITAMIN B12: Vitamin B-12: 296 pg/mL (ref 232–1245)

## 2023-06-27 LAB — ANA W/REFLEX: Anti Nuclear Antibody (ANA): NEGATIVE

## 2023-06-27 LAB — TSH+FREE T4
Free T4: 1.42 ng/dL (ref 0.82–1.77)
TSH: 2.04 u[IU]/mL (ref 0.450–4.500)

## 2023-06-27 LAB — METHYLMALONIC ACID, SERUM: Methylmalonic Acid: 242 nmol/L (ref 0–378)

## 2023-06-27 LAB — HOMOCYSTEINE: Homocysteine: 25.8 umol/L — ABNORMAL HIGH (ref 0.0–17.2)

## 2023-07-26 ENCOUNTER — Telehealth: Payer: Self-pay | Admitting: Neurology

## 2023-07-26 ENCOUNTER — Ambulatory Visit (INDEPENDENT_AMBULATORY_CARE_PROVIDER_SITE_OTHER): Payer: Medicare HMO | Admitting: Neurology

## 2023-07-26 DIAGNOSIS — G252 Other specified forms of tremor: Secondary | ICD-10-CM

## 2023-07-26 DIAGNOSIS — G319 Degenerative disease of nervous system, unspecified: Secondary | ICD-10-CM

## 2023-07-26 DIAGNOSIS — I169 Hypertensive crisis, unspecified: Secondary | ICD-10-CM

## 2023-07-26 DIAGNOSIS — I6783 Posterior reversible encephalopathy syndrome: Secondary | ICD-10-CM

## 2023-07-26 NOTE — Telephone Encounter (Signed)
Pt and wife presented today for pt to complete EEG. While here the wife states that since starting the carbidopa levodopa the patient is like a zombie. Advised the wife to have the patient reduce to half a tablet three times a day and see if he tolerates reducing the dose better. They verbalized understanding

## 2023-07-26 NOTE — Procedures (Signed)
    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 EEG to evaluate for seizure   EEG classification: Awake and drowsy  Duration: 28 minutes   Technical aspects: This EEG study was done with scalp electrodes positioned according to the 10-20 International system of electrode placement. Electrical activity was reviewed with band pass filter of 1-70Hz , sensitivity of 7 uV/mm, display speed of 42mm/sec with a 60Hz  notched filter applied as appropriate. EEG data were recorded continuously and digitally stored.   Description of the recording: The background rhythms of this recording consists of a low amplitude alpha rhythm of 8.5 Hz that is reactive to eye opening and closure. Present in the anterior head region is a 15-20 Hz beta activity. Photic stimulation was performed, did not show any abnormalities. Hyperventilation was also performed, did not show any abnormalities. Drowsiness was manifested by background fragmentation. No abnormal epileptiform discharges seen during this recording. There was no focal slowing. There were no electrographic seizure identified.   Abnormality: None   Impression: This is a normal EEG recorded while drowsy and awake. No evidence of interictal epileptiform discharges. Normal EEGs, however, do not rule out epilepsy.    Windell Norfolk, MD [facility]

## 2023-07-30 ENCOUNTER — Telehealth: Payer: Self-pay

## 2023-07-30 NOTE — Telephone Encounter (Signed)
Called patient and spoke with his wife and informed her that normal EEG without signs of slowing or abnormal discharges. Pt verbalized understanding. Pt had no questions at this time but was encouraged to call back if questions arise.

## 2023-07-30 NOTE — Telephone Encounter (Signed)
-----   Message from Madelia Dohmeier sent at 07/27/2023 10:53 AM EST ----- Normal EEG without signs of slowing or abnormal discharges.

## 2023-11-17 ENCOUNTER — Encounter (HOSPITAL_BASED_OUTPATIENT_CLINIC_OR_DEPARTMENT_OTHER): Payer: Self-pay | Admitting: Emergency Medicine

## 2023-11-17 ENCOUNTER — Emergency Department (HOSPITAL_BASED_OUTPATIENT_CLINIC_OR_DEPARTMENT_OTHER)

## 2023-11-17 ENCOUNTER — Other Ambulatory Visit: Payer: Self-pay

## 2023-11-17 ENCOUNTER — Emergency Department (HOSPITAL_BASED_OUTPATIENT_CLINIC_OR_DEPARTMENT_OTHER)
Admission: EM | Admit: 2023-11-17 | Discharge: 2023-11-17 | Disposition: A | Discharge status: Home / Self Care | Attending: Emergency Medicine | Admitting: Emergency Medicine

## 2023-11-17 DIAGNOSIS — S39012A Strain of muscle, fascia and tendon of lower back, initial encounter: Secondary | ICD-10-CM

## 2023-11-17 DIAGNOSIS — N189 Chronic kidney disease, unspecified: Secondary | ICD-10-CM | POA: Diagnosis not present

## 2023-11-17 DIAGNOSIS — S161XXA Strain of muscle, fascia and tendon at neck level, initial encounter: Secondary | ICD-10-CM | POA: Diagnosis not present

## 2023-11-17 DIAGNOSIS — M542 Cervicalgia: Secondary | ICD-10-CM | POA: Diagnosis present

## 2023-11-17 DIAGNOSIS — Y92410 Unspecified street and highway as the place of occurrence of the external cause: Secondary | ICD-10-CM | POA: Insufficient documentation

## 2023-11-17 DIAGNOSIS — Z7982 Long term (current) use of aspirin: Secondary | ICD-10-CM | POA: Diagnosis not present

## 2023-11-17 MED ORDER — OXYCODONE-ACETAMINOPHEN 5-325 MG PO TABS
1.0000 | ORAL_TABLET | Freq: Once | ORAL | Status: AC
  Administered 2023-11-17: 1 via ORAL
  Filled 2023-11-17: qty 1

## 2023-11-17 NOTE — ED Notes (Signed)
 Pt in Radiology

## 2023-11-17 NOTE — ED Notes (Signed)
RN provided AVS using Teachback Method. Patient verbalizes understanding of Discharge Instructions. Opportunity for Questioning and Answers were provided by RN. Patient Discharged from ED in wheelchair to home with family. 

## 2023-11-17 NOTE — Discharge Instructions (Signed)
 Please use Tylenol for pain.  You may use 1000 mg of Tylenol every 6 hours.  Not to exceed 4 g of Tylenol within 24 hours.  You can use the muscle relaxant you already have at home in addition to the above to help with any breakthrough pain.  You can take it up to twice daily.  It is safe to take at night, but I would be cautious taking it during the day as it can cause some drowsiness.  Make sure that you are feeling awake and alert before you get behind the wheel of a car or operate a motor vehicle.  It is not a narcotic pain medication so you are able to take it if it is not making you drowsy and still pilot a vehicle or machinery safely.  You can place the lidocaine patches directly where you are having pain.  Please refer to the rehab exercises once your pain has slightly decreased to get some strength back to the affected muscle groups.  Please follow-up with your primary care doctor or an orthopedic physician if you continue to have pain after 1 to 2 weeks despite treatment.
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
## 2024-01-17 ENCOUNTER — Other Ambulatory Visit: Payer: Self-pay

## 2024-01-17 ENCOUNTER — Emergency Department (HOSPITAL_BASED_OUTPATIENT_CLINIC_OR_DEPARTMENT_OTHER)

## 2024-01-17 ENCOUNTER — Encounter (HOSPITAL_BASED_OUTPATIENT_CLINIC_OR_DEPARTMENT_OTHER): Payer: Self-pay

## 2024-01-17 ENCOUNTER — Observation Stay (HOSPITAL_BASED_OUTPATIENT_CLINIC_OR_DEPARTMENT_OTHER)
Admission: EM | Admit: 2024-01-17 | Discharge: 2024-01-18 | Disposition: A | Discharge status: Home / Self Care | Attending: Emergency Medicine | Admitting: Emergency Medicine

## 2024-01-17 DIAGNOSIS — R079 Chest pain, unspecified: Principal | ICD-10-CM

## 2024-01-17 DIAGNOSIS — K219 Gastro-esophageal reflux disease without esophagitis: Secondary | ICD-10-CM | POA: Diagnosis present

## 2024-01-17 DIAGNOSIS — Z1152 Encounter for screening for COVID-19: Secondary | ICD-10-CM | POA: Insufficient documentation

## 2024-01-17 DIAGNOSIS — G20A1 Parkinson's disease without dyskinesia, without mention of fluctuations: Secondary | ICD-10-CM | POA: Diagnosis not present

## 2024-01-17 DIAGNOSIS — R0789 Other chest pain: Secondary | ICD-10-CM | POA: Diagnosis not present

## 2024-01-17 DIAGNOSIS — R0602 Shortness of breath: Secondary | ICD-10-CM | POA: Insufficient documentation

## 2024-01-17 DIAGNOSIS — N289 Disorder of kidney and ureter, unspecified: Secondary | ICD-10-CM

## 2024-01-17 DIAGNOSIS — N1832 Chronic kidney disease, stage 3b: Secondary | ICD-10-CM | POA: Diagnosis not present

## 2024-01-17 DIAGNOSIS — I129 Hypertensive chronic kidney disease with stage 1 through stage 4 chronic kidney disease, or unspecified chronic kidney disease: Secondary | ICD-10-CM | POA: Insufficient documentation

## 2024-01-17 DIAGNOSIS — E785 Hyperlipidemia, unspecified: Secondary | ICD-10-CM | POA: Diagnosis not present

## 2024-01-17 DIAGNOSIS — Z8679 Personal history of other diseases of the circulatory system: Secondary | ICD-10-CM

## 2024-01-17 DIAGNOSIS — Z7982 Long term (current) use of aspirin: Secondary | ICD-10-CM | POA: Insufficient documentation

## 2024-01-17 LAB — BASIC METABOLIC PANEL WITH GFR
Anion gap: 13 (ref 5–15)
BUN: 19 mg/dL (ref 8–23)
CO2: 24 mmol/L (ref 22–32)
Calcium: 8.9 mg/dL (ref 8.9–10.3)
Chloride: 105 mmol/L (ref 98–111)
Creatinine, Ser: 1.74 mg/dL — ABNORMAL HIGH (ref 0.61–1.24)
GFR, Estimated: 42 mL/min — ABNORMAL LOW (ref >60)
Glucose, Bld: 105 mg/dL — ABNORMAL HIGH (ref 70–99)
Potassium: 3.7 mmol/L (ref 3.5–5.1)
Sodium: 142 mmol/L (ref 135–145)

## 2024-01-17 LAB — CBC
HCT: 47.1 % (ref 39.0–52.0)
Hemoglobin: 15.5 g/dL (ref 13.0–17.0)
MCH: 25.7 pg — ABNORMAL LOW (ref 26.0–34.0)
MCHC: 32.9 g/dL (ref 30.0–36.0)
MCV: 78.2 fL — ABNORMAL LOW (ref 80.0–100.0)
Platelets: 160 10*3/uL (ref 150–400)
RBC: 6.02 MIL/uL — ABNORMAL HIGH (ref 4.22–5.81)
RDW: 13.5 % (ref 11.5–15.5)
WBC: 5.3 10*3/uL (ref 4.0–10.5)
nRBC: 0 % (ref 0.0–0.2)

## 2024-01-17 LAB — TROPONIN T, HIGH SENSITIVITY
Troponin T High Sensitivity: 22 ng/L — ABNORMAL HIGH (ref <19)
Troponin T High Sensitivity: 22 ng/L — ABNORMAL HIGH (ref <19)

## 2024-01-17 NOTE — ED Notes (Signed)
 RN attempted to obtain lab work x2.

## 2024-01-17 NOTE — ED Notes (Signed)
 Pt. Reports he has had chest pain for approx. 4 days with now having less chest pain and having bilat. Arm and shoulder pain.  Pt. Reports having low back pain now and L side headache.  Pt. In no distress with wife at bedside.  No c/o nausea or vomiting.

## 2024-01-17 NOTE — ED Triage Notes (Signed)
 Pt arrives with c/o CP that radiates into his bilateral arms and lower back. Pt reports that pain started 4 days ago. Pt reports headaches. Pt denies SOB or n/v.

## 2024-01-18 ENCOUNTER — Encounter (HOSPITAL_COMMUNITY): Payer: Self-pay | Admitting: Internal Medicine

## 2024-01-18 ENCOUNTER — Observation Stay (HOSPITAL_BASED_OUTPATIENT_CLINIC_OR_DEPARTMENT_OTHER)

## 2024-01-18 DIAGNOSIS — R079 Chest pain, unspecified: Secondary | ICD-10-CM

## 2024-01-18 DIAGNOSIS — G20A1 Parkinson's disease without dyskinesia, without mention of fluctuations: Secondary | ICD-10-CM | POA: Diagnosis present

## 2024-01-18 DIAGNOSIS — Z8679 Personal history of other diseases of the circulatory system: Secondary | ICD-10-CM

## 2024-01-18 DIAGNOSIS — Z1152 Encounter for screening for COVID-19: Secondary | ICD-10-CM | POA: Diagnosis not present

## 2024-01-18 DIAGNOSIS — E785 Hyperlipidemia, unspecified: Secondary | ICD-10-CM | POA: Diagnosis present

## 2024-01-18 DIAGNOSIS — K219 Gastro-esophageal reflux disease without esophagitis: Secondary | ICD-10-CM | POA: Diagnosis present

## 2024-01-18 DIAGNOSIS — I129 Hypertensive chronic kidney disease with stage 1 through stage 4 chronic kidney disease, or unspecified chronic kidney disease: Secondary | ICD-10-CM | POA: Diagnosis not present

## 2024-01-18 DIAGNOSIS — Z7982 Long term (current) use of aspirin: Secondary | ICD-10-CM | POA: Diagnosis not present

## 2024-01-18 DIAGNOSIS — N1832 Chronic kidney disease, stage 3b: Secondary | ICD-10-CM | POA: Diagnosis not present

## 2024-01-18 DIAGNOSIS — E782 Mixed hyperlipidemia: Secondary | ICD-10-CM | POA: Diagnosis not present

## 2024-01-18 DIAGNOSIS — R0789 Other chest pain: Secondary | ICD-10-CM | POA: Diagnosis not present

## 2024-01-18 LAB — ECHOCARDIOGRAM COMPLETE
Area-P 1/2: 3.68 cm2
Height: 71 in
S' Lateral: 3.4 cm
Weight: 4225.78 [oz_av]

## 2024-01-18 LAB — CBC WITH DIFFERENTIAL/PLATELET
Abs Immature Granulocytes: 0.01 10*3/uL (ref 0.00–0.07)
Basophils Absolute: 0 10*3/uL (ref 0.0–0.1)
Basophils Relative: 0 %
Eosinophils Absolute: 0.1 10*3/uL (ref 0.0–0.5)
Eosinophils Relative: 2 %
HCT: 48.7 % (ref 39.0–52.0)
Hemoglobin: 16 g/dL (ref 13.0–17.0)
Immature Granulocytes: 0 %
Lymphocytes Relative: 33 %
Lymphs Abs: 1.5 10*3/uL (ref 0.7–4.0)
MCH: 25.8 pg — ABNORMAL LOW (ref 26.0–34.0)
MCHC: 32.9 g/dL (ref 30.0–36.0)
MCV: 78.7 fL — ABNORMAL LOW (ref 80.0–100.0)
Monocytes Absolute: 0.4 10*3/uL (ref 0.1–1.0)
Monocytes Relative: 8 %
Neutro Abs: 2.6 10*3/uL (ref 1.7–7.7)
Neutrophils Relative %: 57 %
Platelets: 159 10*3/uL (ref 150–400)
RBC: 6.19 MIL/uL — ABNORMAL HIGH (ref 4.22–5.81)
RDW: 13.4 % (ref 11.5–15.5)
WBC: 4.6 10*3/uL (ref 4.0–10.5)
nRBC: 0 % (ref 0.0–0.2)

## 2024-01-18 LAB — COMPREHENSIVE METABOLIC PANEL WITH GFR
ALT: 19 U/L (ref 0–44)
AST: 21 U/L (ref 15–41)
Albumin: 3.9 g/dL (ref 3.5–5.0)
Alkaline Phosphatase: 65 U/L (ref 38–126)
Anion gap: 11 (ref 5–15)
BUN: 16 mg/dL (ref 8–23)
CO2: 22 mmol/L (ref 22–32)
Calcium: 8.9 mg/dL (ref 8.9–10.3)
Chloride: 105 mmol/L (ref 98–111)
Creatinine, Ser: 1.72 mg/dL — ABNORMAL HIGH (ref 0.61–1.24)
GFR, Estimated: 43 mL/min — ABNORMAL LOW (ref >60)
Glucose, Bld: 134 mg/dL — ABNORMAL HIGH (ref 70–99)
Potassium: 3.8 mmol/L (ref 3.5–5.1)
Sodium: 138 mmol/L (ref 135–145)
Total Bilirubin: 1.1 mg/dL (ref 0.0–1.2)
Total Protein: 6.9 g/dL (ref 6.5–8.1)

## 2024-01-18 LAB — TROPONIN I (HIGH SENSITIVITY): Troponin I (High Sensitivity): 11 ng/L (ref <18)

## 2024-01-18 LAB — RESP PANEL BY RT-PCR (RSV, FLU A&B, COVID)  RVPGX2
Influenza A by PCR: NEGATIVE
Influenza B by PCR: NEGATIVE
Resp Syncytial Virus by PCR: NEGATIVE
SARS Coronavirus 2 by RT PCR: NEGATIVE

## 2024-01-18 LAB — LIPASE, BLOOD: Lipase: 43 U/L (ref 11–51)

## 2024-01-18 LAB — MAGNESIUM: Magnesium: 1.9 mg/dL (ref 1.7–2.4)

## 2024-01-18 LAB — MRSA NEXT GEN BY PCR, NASAL: MRSA by PCR Next Gen: NOT DETECTED

## 2024-01-18 LAB — TROPONIN T, HIGH SENSITIVITY: Troponin T High Sensitivity: 20 ng/L — ABNORMAL HIGH (ref <19)

## 2024-01-18 MED ORDER — PANTOPRAZOLE SODIUM 40 MG IV SOLR
40.0000 mg | Freq: Every day | INTRAVENOUS | Status: DC
  Administered 2024-01-18: 40 mg via INTRAVENOUS
  Filled 2024-01-18: qty 10

## 2024-01-18 MED ORDER — ACETAMINOPHEN 325 MG PO TABS: 650.0000 mg | ORAL_TABLET | Freq: Four times a day (QID) | ORAL | 0 refills | Status: AC | PRN

## 2024-01-18 MED ORDER — METOPROLOL TARTRATE 50 MG PO TABS
50.0000 mg | ORAL_TABLET | Freq: Two times a day (BID) | ORAL | Status: DC
  Administered 2024-01-18: 50 mg via ORAL
  Filled 2024-01-18: qty 1

## 2024-01-18 MED ORDER — ASPIRIN 81 MG PO TBEC: 81.0000 mg | DELAYED_RELEASE_TABLET | Freq: Every day | ORAL | Status: DC

## 2024-01-18 MED ORDER — NALOXONE HCL 0.4 MG/ML IJ SOLN: 0.4000 mg | INTRAMUSCULAR | Status: DC | PRN

## 2024-01-18 MED ORDER — IOHEXOL 350 MG/ML SOLN
100.0000 mL | Freq: Once | INTRAVENOUS | Status: AC | PRN
  Administered 2024-01-18: 100 mL via INTRAVENOUS

## 2024-01-18 MED ORDER — ATORVASTATIN CALCIUM 40 MG PO TABS
40.0000 mg | ORAL_TABLET | Freq: Every day | ORAL | Status: DC
  Administered 2024-01-18: 40 mg via ORAL
  Filled 2024-01-18: qty 1

## 2024-01-18 MED ORDER — ACETAMINOPHEN 650 MG RE SUPP: 650.0000 mg | Freq: Four times a day (QID) | RECTAL | Status: DC | PRN

## 2024-01-18 MED ORDER — KETOROLAC TROMETHAMINE 30 MG/ML IJ SOLN
30.0000 mg | Freq: Once | INTRAMUSCULAR | Status: AC
  Administered 2024-01-18: 30 mg via INTRAVENOUS
  Filled 2024-01-18: qty 1

## 2024-01-18 MED ORDER — MUSCLE RUB 10-15 % EX CREA
1.0000 | TOPICAL_CREAM | CUTANEOUS | Status: DC | PRN
  Administered 2024-01-18: 1 via TOPICAL
  Filled 2024-01-18: qty 85

## 2024-01-18 MED ORDER — CARBIDOPA-LEVODOPA 25-100 MG PO TABS
0.5000 | ORAL_TABLET | Freq: Three times a day (TID) | ORAL | Status: DC
  Administered 2024-01-18 (×2): 0.5 via ORAL
  Filled 2024-01-18 (×3): qty 0.5

## 2024-01-18 MED ORDER — ONDANSETRON HCL 4 MG/2ML IJ SOLN: 4.0000 mg | Freq: Four times a day (QID) | INTRAMUSCULAR | Status: DC | PRN

## 2024-01-18 MED ORDER — FENTANYL CITRATE PF 50 MCG/ML IJ SOSY: 50.0000 ug | PREFILLED_SYRINGE | INTRAMUSCULAR | Status: DC | PRN

## 2024-01-18 MED ORDER — FENTANYL CITRATE PF 50 MCG/ML IJ SOSY
50.0000 ug | PREFILLED_SYRINGE | Freq: Once | INTRAMUSCULAR | Status: AC
  Administered 2024-01-18: 50 ug via INTRAVENOUS
  Filled 2024-01-18: qty 1

## 2024-01-18 MED ORDER — PERFLUTREN LIPID MICROSPHERE
1.0000 mL | INTRAVENOUS | Status: AC | PRN
  Administered 2024-01-18: 5 mL via INTRAVENOUS

## 2024-01-18 MED ORDER — MELATONIN 3 MG PO TABS: 3.0000 mg | ORAL_TABLET | Freq: Every evening | ORAL | Status: DC | PRN

## 2024-01-18 MED ORDER — ACETAMINOPHEN 325 MG PO TABS: 650.0000 mg | ORAL_TABLET | Freq: Four times a day (QID) | ORAL | Status: DC | PRN

## 2024-01-18 MED ORDER — ASPIRIN 81 MG PO CHEW
324.0000 mg | CHEWABLE_TABLET | Freq: Once | ORAL | Status: AC
  Administered 2024-01-18: 324 mg via ORAL
  Filled 2024-01-18: qty 4

## 2024-01-18 NOTE — Progress Notes (Addendum)
 Patient provided with verbal discharge instructions. Wife at bedside during d/c. Paper copy of discharge provided to patient. RN answered all questions. VSS at discharge. IV removed. Patient belongings sent with patient. Patient dc'd via wheelchair to private vehicle

## 2024-01-18 NOTE — ED Notes (Signed)
 Patient given TV dinner and will be NPO after eaten. EDP approved and patient is aware.

## 2024-01-18 NOTE — H&P (Signed)
 History and Physical      Aaron Marquez YQM:[id number] DOB: 02-Mar-1956 DOA: 01/17/2024; DOS: 01/18/2024  PCP: Stroud, Natalie M, FNP  Patient coming from: home   I have personally briefly reviewed patient's old medical records in [facility]  Chief Complaint: Chest pain  HPI: Aaron Marquez is a 68 y.o. 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  The patient reports intermittent sharp, nonradiating left-sided chest discomfort over the course of the last 2 days.  He first noted this left-sided chest discomfort when lifting a heavy object 2 days ago.  He has refrained from any additional heavy lifting in the interval, and notes that the intermittent chest discomfort has subsequently become less frequent and less intense relative to that which she was originally experiencing 2 days ago.  He reports that the chest pain is reproducible with certain motions of the left upper extremity engaging the pectoralis muscles.  He also notes reproducibility with direct palpation over the anterior aspect of the left chest wall.  Otherwise, he conveys that the chest discomfort has been nonexertional, nonpleuritic.  Denies any associated shortness of breath, palpitations, diaphoresis, nausea, vomiting, dizziness, presyncope, or syncope.  No recent subjective fever/chills.  Denies any recent cough or hemoptysis.  Denies any known history of underlying coronary artery disease.  Per chart review, no prior echocardiogram result on file.     [facility] ED Course:  Vital signs in the ED were notable for the following: Afebrile; heart rates in the 60s to 70s; stop blood pressures in the 140s to 160s; respiratory  rate 16-18, oxygen saturation 95 to 98% on room air.  Labs were notable for the following: BMP was notable for the following: Sodium 142, bicarbonate 24, creatinine 1.74 compared to most recent prior value 1.93 in November 2024, glucose 105.  Initial high sensitive troponin I value was 22, with repeat trending down to 20.  CBC notable for white blood cell count 5300, hemoglobin 15.5.  COVID, influenza, RSV PCR were all negative.  Per my interpretation, EKG in ED demonstrated the following: In comparison to most recent prior EKG from 04/30/2023, today's EKG shows sinus rhythm with first-degree AV block, PAC, rate 74, nonspecific T wave flattening in leads I and aVL, which appears improved relative to the T wave inversion noted in these leads and most recent EKG from September 2024, demonstrate no evidence of ST changes, including no evidence of ST elevation.  Imaging in the ED, per corresponding formal radiology read, was notable for the following: 2 view chest x-ray showed no evidence of acute cardiopulmonary process, including no evidence of infiltrate, edema, effusion, or pneumothorax.  CTA chest, abdomen pelvis with dissection protocol showed no evidence of aortic aneurysm or dissection, no evidence of acute pulmonary embolism and no evidence of acute cardiopulmonary process, including no evidence of infiltrate or edema, effusion, or pneumothorax.  This imaging showed cholelithiasis, without any evidence of acute cholecystitis, and no evidence of bowel obstruction, abscess, or perforation.  While in the ED, the following were administered: Full dose aspirin  x 1, fentanyl  50 mcg IV x 1, Toradol  30 mg IV x 1.  Subsequently, the patient was admitted to [facility] for overnight observation for further evaluation management of presenting  atypical chest pain.     Review of Systems: As per HPI otherwise 10 point review of systems negative.   Past Medical History:  Diagnosis Date   Back pain    GERD  (gastroesophageal reflux disease)    Gout    Hypertension    Lumbar hernia    Pneumonia 07/31/12    Past Surgical History:  Procedure Laterality Date   BACK SURGERY     FERTILITY SURGERY     stent   LUMBAR LAMINECTOMY/DECOMPRESSION MICRODISCECTOMY Right 01/01/2013   Procedure: LUMBAR LAMINECTOMY/DECOMPRESSION MICRODISCECTOMY 1 LEVEL;  Surgeon: Ferris Hua, MD;  Location: [facility];  Service: Neurosurgery;  Laterality: Right;  Lumbar Laminectomies Lumbar Four-Five, Decompression, Discectomy     Social History:  reports that he has never smoked. He has never used smokeless tobacco. He reports that he does not drink alcohol and does not use drugs.   Allergies  Allergen Reactions   Lisinopril  Nausea Only and Other (See Comments)    Makes me gag Makes me Gag Tingling in Throat Tingling in throat     History reviewed. No pertinent family history.  Family history reviewed and not pertinent    Prior to Admission medications   Medication Sig Start Date End Date Taking? Authorizing Provider  aspirin  81 MG tablet Take 81 mg by mouth daily.    [provider]  atorvastatin (LIPITOR) 80 MG tablet Take 80 mg by mouth daily.    [provider]  carbidopa -levodopa  (SINEMET  IR) 25-100 MG tablet Take 1 tablet by mouth 3 (three) times daily. 06/21/23   Dohmeier, Raoul Byes, MD  COVID-19 mRNA bivalent vaccine, Moderna, (MODERNA COVID-19 BIVAL BOOSTER) 50 MCG/0.5ML injection Inject into the muscle. 07/25/21   Liane Redman, MD  COVID-19 mRNA vaccine, Moderna, (MODERNA COVID-19 VACCINE ) 100 MCG/0.5ML injection Inject into the muscle. 12/27/20   Liane Redman, MD  HYDROcodone -acetaminophen  (NORCO/VICODIN) 5-325 MG tablet Take 1-2 tablets by mouth every 6 (six) hours as needed. 01/10/23   Wynetta Heckle, MD  methocarbamol  (ROBAXIN ) 500 MG tablet Take 1 tablet (500 mg total) by mouth 2 (two) times daily. 07/31/19   Khatri, Hina, PA-C  methylPREDNISolone  (MEDROL  DOSEPAK) 4 MG  TBPK tablet Follow package insert 08/04/22   Curatolo, Adam, DO  metoprolol  (LOPRESSOR ) 50 MG tablet Take 50 mg by mouth 2 (two) times daily.    [provider]  metoprolol  succinate (TOPROL -XL) 50 MG 24 hr tablet Take 50 mg by mouth 2 (two) times daily. Take with or immediately following a meal.    [provider]  omeprazole (PRILOSEC) 20 MG capsule Take 20 mg by mouth 2 (two) times daily.    [provider]  predniSONE  (STERAPRED UNI-PAK 21 TAB) 10 MG (21) TBPK tablet Take by mouth daily. Take 6 tabs by mouth daily  for 1 day, then 5 tabs for 1 day, then 4 tabs for 1 day, then 3 tabs for 1 day, 2 tabs for 1 day, then 1 tab by mouth daily for 1 day 12/17/21   Camellia Caves, MD  predniSONE  (STERAPRED UNI-PAK 21 TAB) 10 MG (21) TBPK tablet Take by mouth daily. Take 6 tabs by mouth daily  for 1 days, then 5 tabs for 2 days, then 4 tabs for 2 days, then 3 tabs for 2 days, 2 tabs for 2 days, then 1 tab by mouth daily for 2 days 01/10/23   Wynetta Heckle, MD  traMADol  (ULTRAM ) 50 MG tablet Take 1 tablet (50 mg total) by mouth every  6 (six) hours as needed. 05/08/18   Bart Born, MD     Objective    Physical Exam: Vitals:   01/17/24 2310 01/17/24 2345 01/18/24 0222 01/18/24 0336  BP: (!) 148/85 (!) 146/88 (!) 149/77 (!) 144/91  Pulse: 66 67 65   Resp: 17 17 10 16   Temp:   98.1 F (36.7 C) 98.5 F (36.9 C)  TempSrc:   Oral Oral  SpO2: 97% 95% 96% 95%  Weight:    119.8 kg  Height:    5' 11 (1.803 m)    General: appears to be stated age; alert, oriented Skin: warm, dry, no rash Head:  AT/[place] Mouth:  Oral mucosa membranes appear moist, normal dentition Neck: supple; trachea midline Chest: Tenderness to palpation over the anterior aspect of the left chest wall, without any evidence of paradoxical motion Heart:  RRR; did not appreciate any M/R/G Lungs: CTAB, did not appreciate any wheezes, rales, or rhonchi Abdomen: + BS; soft, ND, NT Vascular: 2+ pedal  pulses b/l; 2+ radial pulses b/l Extremities: no peripheral edema, no muscle wasting Neuro: strength and sensation intact in upper and lower extremities b/l     Labs on Admission: I have personally reviewed following labs and imaging studies  CBC: Recent Labs  Lab 01/17/24 2308  WBC 5.3  HGB 15.5  HCT 47.1  MCV 78.2*  PLT 160   Basic Metabolic Panel: Recent Labs  Lab 01/17/24 2308  NA 142  K 3.7  CL 105  CO2 24  GLUCOSE 105*  BUN 19  CREATININE 1.74*  CALCIUM 8.9   GFR: Estimated Creatinine Clearance: 54.2 mL/min (A) (by C-G formula based on SCr of 1.74 mg/dL (H)). Liver Function Tests: No results for input(s): AST, ALT, ALKPHOS, BILITOT, PROT, ALBUMIN in the last 168 hours. No results for input(s): LIPASE, AMYLASE in the last 168 hours. No results for input(s): AMMONIA in the last 168 hours. Coagulation Profile: No results for input(s): INR, PROTIME in the last 168 hours. Cardiac Enzymes: No results for input(s): CKTOTAL, CKMB, CKMBINDEX, TROPONINI in the last 168 hours. BNP (last 3 results) No results for input(s): PROBNP in the last 8760 hours. HbA1C: No results for input(s): HGBA1C in the last 72 hours. CBG: No results for input(s): GLUCAP in the last 168 hours. Lipid Profile: No results for input(s): CHOL, HDL, LDLCALC, TRIG, CHOLHDL, LDLDIRECT in the last 72 hours. Thyroid Function Tests: No results for input(s): TSH, T4TOTAL, FREET4, T3FREE, THYROIDAB in the last 72 hours. Anemia Panel: No results for input(s): VITAMINB12, FOLATE, FERRITIN, TIBC, IRON, RETICCTPCT in the last 72 hours. Urine analysis:    Component Value Date/Time   COLORURINE YELLOW 04/30/2023 1930   APPEARANCEUR CLEAR 04/30/2023 1930   LABSPEC 1.025 04/30/2023 1930   PHURINE 5.5 04/30/2023 1930   GLUCOSEU NEGATIVE 04/30/2023 1930   HGBUR NEGATIVE 04/30/2023 1930   BILIRUBINUR NEGATIVE 04/30/2023 1930    KETONESUR NEGATIVE 04/30/2023 1930   PROTEINUR NEGATIVE 04/30/2023 1930   UROBILINOGEN 1.0 06/12/2015 1714   NITRITE NEGATIVE 04/30/2023 1930   LEUKOCYTESUR NEGATIVE 04/30/2023 1930    Radiological Exams on Admission: CT Angio Chest/Abd/Pel for Dissection W and/or Wo Contrast Result Date: 01/18/2024 CLINICAL DATA:  Acute aortic syndrome (AAS) suspected 3. Chest pain, low back pain EXAM: CT ANGIOGRAPHY CHEST, ABDOMEN AND PELVIS TECHNIQUE: Non-contrast CT of the chest was initially obtained. Multidetector CT imaging through the chest, abdomen and pelvis was performed using the standard protocol during bolus administration of intravenous contrast. Multiplanar reconstructed images and MIPs  were obtained and reviewed to evaluate the vascular anatomy. RADIATION DOSE REDUCTION: This exam was performed according to the departmental dose-optimization program which includes automated exposure control, adjustment of the mA and/or kV according to patient size and/or use of iterative reconstruction technique. CONTRAST:  OMNIPAQUE  IOHEXOL  350 MG/ML SOLN COMPARISON:  12/17/2021 FINDINGS: CTA CHEST FINDINGS Cardiovascular: Heart is normal size. Aorta is normal caliber. No dissection. Scattered aortic calcifications. No filling defects in the pulmonary arteries to suggest pulmonary emboli. Mediastinum/Nodes: No mediastinal, hilar, or axillary adenopathy. Trachea and esophagus are unremarkable. Thyroid unremarkable. Lungs/Pleura: No confluent opacities or effusions. Musculoskeletal: Chest wall soft tissues are unremarkable. No acute bony abnormality. Review of the MIP images confirms the above findings. CTA ABDOMEN AND PELVIS FINDINGS VASCULAR Aorta: Normal caliber aorta without aneurysm, dissection, vasculitis or significant stenosis. Celiac: Patent without evidence of aneurysm, dissection, vasculitis or significant stenosis. SMA: Patent without evidence of aneurysm, dissection, vasculitis or significant stenosis.  Renals: Both renal arteries are patent without evidence of aneurysm, dissection, vasculitis, fibromuscular dysplasia or significant stenosis. IMA: Patent without evidence of aneurysm, dissection, vasculitis or significant stenosis. Inflow: Patent without evidence of aneurysm, dissection, vasculitis or significant stenosis. Veins: No obvious venous abnormality within the limitations of this arterial phase study. Review of the MIP images confirms the above findings. NON-VASCULAR Hepatobiliary: Gallstones fill the gallbladder. No focal hepatic abnormality or biliary ductal dilatation. Pancreas: No focal hepatic abnormality.  Gallbladder unremarkable. Spleen: No focal abnormality or ductal dilatation. Adrenals/Urinary Tract: Adrenal glands normal. 4.5 cm cyst in the lower pole of the left kidney. No follow-up imaging recommended. No stones or hydronephrosis. Urinary bladder unremarkable. Stomach/Bowel: Few scattered colonic diverticula. No active diverticulitis. Normal appendix. Stomach and small bowel decompressed. No bowel obstruction or inflammatory process. Lymphatic: No adenopathy Reproductive: No visible focal abnormality. Other: No free fluid or free air. Moderate-sized right inguinal hernia containing small bowel loops. No bowel obstruction. Musculoskeletal: No acute bony abnormality. Review of the MIP images confirms the above findings. IMPRESSION: No evidence of aortic aneurysm or dissection. No evidence of pulmonary embolus. No acute cardiopulmonary disease. Cholelithiasis.  No CT evidence of acute cholecystitis. Moderate-sized right inguinal hernia containing small bowel loops. No evidence of bowel obstruction. Electronically Signed   By: Janeece Mechanic M.D.   On: 01/18/2024 00:30   DG Chest 2 View Result Date: 01/17/2024 CLINICAL DATA:  Chest pain EXAM: CHEST - 2 VIEW COMPARISON:  07/08/2022 FINDINGS: The heart size and mediastinal contours are within normal limits. Both lungs are clear. The visualized  skeletal structures are unremarkable. IMPRESSION: No active cardiopulmonary disease. Electronically Signed   By: Esmeralda Hedge M.D.   On: 01/17/2024 21:55      Assessment/Plan   Principal Problem:   Atypical chest pain Active Problems:   History of essential hypertension   HLD (hyperlipidemia)   GERD (gastroesophageal reflux disease)   CKD stage 3b, GFR 30-44 ml/min (HCC)   Parkinson's disease (HCC)     #) Atypical chest pain: 2 days of intermittent sharp, nonradiating left chest wall discomfort that is reproducible with movement of the left upper extremity as well as palpation over the anterior aspect of the left chest wall, which appears suggestive of a musculoskeletal etiology, will noting onset of chest discomfort while the patient was lifting a heavy object, raising suspicion for a component of muscular strain.  Presenting chest discomfort appears atypical for ACS, noting that the chest discomfort is nonexertional, while EKG shows no evidence of acute ischemic changes, including no evidence  of STEMI, and minimally elevated initial troponin has subsequently trending down, as further quantified above.   CTA chest, abdomen, pelvis with dissection protocol showed no evidence of aortic aneurysm or dissection, no evidence of acute pulmonary embolism.  Evidence of additional acute cardiopulmonary process, including no evidence of pneumothorax.  While ACS appears less likely at this time, given the patient's risk factors for development of coronary artery disease, including history of send hypertension, hyperlipidemia will continue to trend troponin, pursue echocardiogram, as further detailed below.  Differential for his presenting atypical chest pain includes GERD, noting a documented history of such.  Plan: Continue to trend troponin, with third set of troponin currently pending.  Echocardiogram ordered.  Monitor on telemetry.  Check magnesium level.  Check CMP, CBC in the morning.  Prn IV  fentanyl .  Check urinary drug screen, lipase.  In the context of a history of GERD, will initiate Protonix  40 mg IV daily daily, first dose now.                    #) Essential Hypertension: documented h/o such, with outpatient antihypertensive regimen including metoprolol  tartrate..  SBP's in the ED today: 140s to 160s mmHg, with heart rates in the 60s to 70s.  Plan: Close monitoring of subsequent BP via routine VS. metoprolol  tartrate with hold parameters that include holding this medication for heart rate less than 60.  Monitor on telemetry.                       #) Hyperlipidemia: documented h/o such. On high intensity atorvastatin as outpatient.   Plan: continue home statin.                      #) CKD Stage 3B: Documented history of such, with baseline creatinine 1.6-2.0, with presenting creatinine consistent with this baseline.    Plan: Monitor strict I's and O's and daily weights.  Attempt to avoid nephrotoxic agents.  CMP/magnesium level in the AM.                      #) GERD: documented h/o such; on omeprazole as outpatient.   Plan: Protonix  40 mg IV daily, first dose now, as further detailed above.                    #) Parkinson's disease: Documented history of such, for which patient is on Sinemet  at home.  Plan: Resume home Sinemet .      DVT prophylaxis: SCD's   Code Status: Full code Family Communication: none Disposition Plan: Per Rounding Team Consults called: none;  Admission status: Observation     I SPENT GREATER THAN 75  MINUTES IN CLINICAL CARE TIME/MEDICAL DECISION-MAKING IN COMPLETING THIS ADMISSION.      Kvion Shapley B Collyn Ribas DO [facility]  From 7PM - 7AM   01/18/2024, 3:57 AM

## 2024-01-18 NOTE — Plan of Care (Signed)

## 2024-01-18 NOTE — ED Notes (Signed)
 ED TO INPATIENT HANDOFF REPORT  ED Nurse Name and Phone #: Diedra Fowler RN [phone]   S Name/Age/Gender Aaron Marquez 68 y.o. male Room/Bed: MH02/MH02  Code Status   Code Status: Not on file  Home/SNF/Other Home Patient oriented to: self, place, time, and situation Is this baseline? Yes   Triage Complete: Triage complete  Chief Complaint Chest pain [R07.9]  Triage Note Pt arrives with c/o CP that radiates into his bilateral arms and lower back. Pt reports that pain started 4 days ago. Pt reports headaches. Pt denies SOB or n/v.    Allergies Allergies  Allergen Reactions   Lisinopril  Nausea Only and Other (See Comments)    Makes me gag Makes me Gag Tingling in Throat Tingling in throat     Level of Care/Admitting Diagnosis ED Disposition     ED Disposition  Admit   Condition  --   Comment  Hospital Area: [facility] [[id number]]  Level of Care: Telemetry Cardiac [[id number]]  Interfacility transfer: Yes  May place patient in observation at [facility] or [facility] if equivalent level of care is available:: No  Covid Evaluation: Asymptomatic - no recent exposure (last 10 days) testing not required  Diagnosis: Chest pain [098119]  Admitting Physician: Angelene Kelly [[id number]  Attending Physician: Leocadia Rains          B Medical/Surgery History Past Medical History:  Diagnosis Date   Back pain    GERD (gastroesophageal reflux disease)    Gout    Hypertension    Lumbar hernia    Pneumonia 07/31/12   Past Surgical History:  Procedure Laterality Date   BACK SURGERY     FERTILITY SURGERY     stent   LUMBAR LAMINECTOMY/DECOMPRESSION MICRODISCECTOMY Right 01/01/2013   Procedure: LUMBAR LAMINECTOMY/DECOMPRESSION MICRODISCECTOMY 1 LEVEL;  Surgeon: Ferris Hua, MD;  Location: [facility];  Service: Neurosurgery;  Laterality: Right;  Lumbar Laminectomies Lumbar Four-Five, Decompression, Discectomy      A IV  Location/Drains/Wounds Patient Lines/Drains/Airways Status     Active Line/Drains/Airways     Name Placement date Placement time Site Days   Peripheral IV 01/17/24 20 G Anterior;Left;Proximal Forearm 01/17/24  2307  Forearm  1            Intake/Output Last 24 hours  Intake/Output Summary (Last 24 hours) at 01/18/2024 0206 Last data filed at 01/18/2024 0100 Gross per 24 hour  Intake --  Output 200 ml  Net -200 ml    Labs/Imaging Results for orders placed or performed during the hospital encounter of 01/17/24 (from the past 48 hours)  Basic metabolic panel     Status: Abnormal   Collection Time: 01/17/24 11:08 PM  Result Value Ref Range   Sodium 142 135 - 145 mmol/L   Potassium 3.7 3.5 - 5.1 mmol/L   Chloride 105 98 - 111 mmol/L   CO2 24 22 - 32 mmol/L   Glucose, Bld 105 (H) 70 - 99 mg/dL    Comment: Glucose reference range applies only to samples taken after fasting for at least 8 hours.   BUN 19 8 - 23 mg/dL   Creatinine, Ser 2.95 (H) 0.61 - 1.24 mg/dL   Calcium 8.9 8.9 - 62.1 mg/dL   GFR, Estimated 42 (L) >60 mL/min    Comment: (NOTE) Calculated using the CKD-EPI Creatinine Equation (2021)    Anion gap 13 5 - 15    Comment: Performed at [facility], [address]  [place], [place] [address]  CBC     Status: Abnormal   Collection Time: 01/17/24 11:08 PM  Result Value Ref Range   WBC 5.3 4.0 - 10.5 K/uL   RBC 6.02 (H) 4.22 - 5.81 MIL/uL   Hemoglobin 15.5 13.0 - 17.0 g/dL   HCT 09.8 11.9 - 14.7 %   MCV 78.2 (L) 80.0 - 100.0 fL   MCH 25.7 (L) 26.0 - 34.0 pg   MCHC 32.9 30.0 - 36.0 g/dL   RDW 82.9 56.2 - 13.0 %   Platelets 160 150 - 400 K/uL   nRBC 0.0 0.0 - 0.2 %    Comment: Performed at [facility], [address] [place], [place] [address]  Troponin T, High Sensitivity     Status: Abnormal   Collection Time: 01/17/24 11:08 PM  Result Value Ref Range   Troponin T High Sensitivity 22 (H) <19 ng/L    Comment: (NOTE) Biotin  concentrations > 1000 ng/mL falsely decrease TnT results.  Serial cardiac troponin measurements are suggested.  Refer to the Links section for chest pain algorithms and additional  guidance. Performed at [facility], [address] [place], [place] [address]   Troponin T, High Sensitivity     Status: Abnormal   Collection Time: 01/17/24 11:08 PM  Result Value Ref Range   Troponin T High Sensitivity 22 (H) <19 ng/L    Comment: (NOTE) Biotin concentrations > 1000 ng/mL falsely decrease TnT results.  Serial cardiac troponin measurements are suggested.  Refer to the Links section for chest pain algorithms and additional  guidance. Performed at [facility], [address] [place], [place] [address]   Resp panel by RT-PCR (RSV, Flu A&B, Covid) Anterior Nasal Swab     Status: None   Collection Time: 01/17/24 11:24 PM   Specimen: Anterior Nasal Swab  Result Value Ref Range   SARS Coronavirus 2 by RT PCR NEGATIVE NEGATIVE    Comment: (NOTE) SARS-CoV-2 target nucleic acids are NOT DETECTED.  The SARS-CoV-2 RNA is generally detectable in upper respiratory specimens during the acute phase of infection. The lowest concentration of SARS-CoV-2 viral copies this assay can detect is 138 copies/mL. A negative result does not preclude SARS-Cov-2 infection and should not be used as the sole basis for treatment or other patient management decisions. A negative result may occur with  improper specimen collection/handling, submission of specimen other than nasopharyngeal swab, presence of viral mutation(s) within the areas targeted by this assay, and inadequate number of viral copies(<138 copies/mL). A negative result must be combined with clinical observations, patient history, and epidemiological information. The expected result is Negative.  Fact Sheet for Patients:  BloggerCourse.com  Fact Sheet for Healthcare Providers:   SeriousBroker.it  This test is no t yet approved or cleared by the [place]  FDA and  has been authorized for detection and/or diagnosis of SARS-CoV-2 by FDA under an Emergency Use Authorization (EUA). This EUA will remain  in effect (meaning this test can be used) for the duration of the COVID-19 declaration under Section 564(b)(1) of the Act, 21 U.S.C.section 360bbb-3(b)(1), unless the authorization is terminated  or revoked sooner.       Influenza A by PCR NEGATIVE NEGATIVE   Influenza B by PCR NEGATIVE NEGATIVE    Comment: (NOTE) The Xpert Xpress SARS-CoV-2/FLU/RSV plus assay is intended as an aid in the diagnosis of influenza from Nasopharyngeal swab specimens and should not be used as a sole  basis for treatment. Nasal washings and aspirates are unacceptable for Xpert Xpress SARS-CoV-2/FLU/RSV testing.  Fact Sheet for Patients: BloggerCourse.com  Fact Sheet for Healthcare Providers: SeriousBroker.it  This test is not yet approved or cleared by the [place]  FDA and has been authorized for detection and/or diagnosis of SARS-CoV-2 by FDA under an Emergency Use Authorization (EUA). This EUA will remain in effect (meaning this test can be used) for the duration of the COVID-19 declaration under Section 564(b)(1) of the Act, 21 U.S.C. section 360bbb-3(b)(1), unless the authorization is terminated or revoked.     Resp Syncytial Virus by PCR NEGATIVE NEGATIVE    Comment: (NOTE) Fact Sheet for Patients: BloggerCourse.com  Fact Sheet for Healthcare Providers: SeriousBroker.it  This test is not yet approved or cleared by the [place]  FDA and has been authorized for detection and/or diagnosis of SARS-CoV-2 by FDA under an Emergency Use Authorization (EUA). This EUA will remain in effect (meaning this test can be used) for the duration of  the COVID-19 declaration under Section 564(b)(1) of the Act, 21 U.S.C. section 360bbb-3(b)(1), unless the authorization is terminated or revoked.  Performed at [facility], [address] [place], [place] [address]   Troponin T, High Sensitivity     Status: Abnormal   Collection Time: 01/18/24  1:26 AM  Result Value Ref Range   Troponin T High Sensitivity 20 (H) <19 ng/L    Comment: (NOTE) Biotin concentrations > 1000 ng/mL falsely decrease TnT results.  Serial cardiac troponin measurements are suggested.  Refer to the Links section for chest pain algorithms and additional  guidance. Performed at [facility], [address] [place], [place] [address]    CT Angio Chest/Abd/Pel for Dissection W and/or Wo Contrast Result Date: 01/18/2024 CLINICAL DATA:  Acute aortic syndrome (AAS) suspected 3. Chest pain, low back pain EXAM: CT ANGIOGRAPHY CHEST, ABDOMEN AND PELVIS TECHNIQUE: Non-contrast CT of the chest was initially obtained. Multidetector CT imaging through the chest, abdomen and pelvis was performed using the standard protocol during bolus administration of intravenous contrast. Multiplanar reconstructed images and MIPs were obtained and reviewed to evaluate the vascular anatomy. RADIATION DOSE REDUCTION: This exam was performed according to the departmental dose-optimization program which includes automated exposure control, adjustment of the mA and/or kV according to patient size and/or use of iterative reconstruction technique. CONTRAST:  OMNIPAQUE  IOHEXOL  350 MG/ML SOLN COMPARISON:  12/17/2021 FINDINGS: CTA CHEST FINDINGS Cardiovascular: Heart is normal size. Aorta is normal caliber. No dissection. Scattered aortic calcifications. No filling defects in the pulmonary arteries to suggest pulmonary emboli. Mediastinum/Nodes: No mediastinal, hilar, or axillary adenopathy. Trachea and esophagus are unremarkable. Thyroid unremarkable. Lungs/Pleura: No confluent  opacities or effusions. Musculoskeletal: Chest wall soft tissues are unremarkable. No acute bony abnormality. Review of the MIP images confirms the above findings. CTA ABDOMEN AND PELVIS FINDINGS VASCULAR Aorta: Normal caliber aorta without aneurysm, dissection, vasculitis or significant stenosis. Celiac: Patent without evidence of aneurysm, dissection, vasculitis or significant stenosis. SMA: Patent without evidence of aneurysm, dissection, vasculitis or significant stenosis. Renals: Both renal arteries are patent without evidence of aneurysm, dissection, vasculitis, fibromuscular dysplasia or significant stenosis. IMA: Patent without evidence of aneurysm, dissection, vasculitis or significant stenosis. Inflow: Patent without evidence of aneurysm, dissection, vasculitis or significant stenosis. Veins: No obvious venous abnormality within the limitations of this arterial phase study. Review of the MIP images confirms the above findings. NON-VASCULAR Hepatobiliary: Gallstones fill the gallbladder. No focal hepatic abnormality or biliary ductal dilatation.  Pancreas: No focal hepatic abnormality.  Gallbladder unremarkable. Spleen: No focal abnormality or ductal dilatation. Adrenals/Urinary Tract: Adrenal glands normal. 4.5 cm cyst in the lower pole of the left kidney. No follow-up imaging recommended. No stones or hydronephrosis. Urinary bladder unremarkable. Stomach/Bowel: Few scattered colonic diverticula. No active diverticulitis. Normal appendix. Stomach and small bowel decompressed. No bowel obstruction or inflammatory process. Lymphatic: No adenopathy Reproductive: No visible focal abnormality. Other: No free fluid or free air. Moderate-sized right inguinal hernia containing small bowel loops. No bowel obstruction. Musculoskeletal: No acute bony abnormality. Review of the MIP images confirms the above findings. IMPRESSION: No evidence of aortic aneurysm or dissection. No evidence of pulmonary embolus. No acute  cardiopulmonary disease. Cholelithiasis.  No CT evidence of acute cholecystitis. Moderate-sized right inguinal hernia containing small bowel loops. No evidence of bowel obstruction. Electronically Signed   By: Janeece Mechanic M.D.   On: 01/18/2024 00:30   DG Chest 2 View Result Date: 01/17/2024 CLINICAL DATA:  Chest pain EXAM: CHEST - 2 VIEW COMPARISON:  07/08/2022 FINDINGS: The heart size and mediastinal contours are within normal limits. Both lungs are clear. The visualized skeletal structures are unremarkable. IMPRESSION: No active cardiopulmonary disease. Electronically Signed   By: Esmeralda Hedge M.D.   On: 01/17/2024 21:55    Pending Labs Unresulted Labs (From admission, onward)    None       Vitals/Pain Today's Vitals   01/17/24 2128 01/17/24 2310 01/17/24 2345 01/18/24 0056  BP: (!) 163/95 (!) 148/85 (!) 146/88   Pulse: 74 66 67   Resp: 18 17 17    Temp: 98.4 F (36.9 C)     SpO2: 98% 97% 95%   Weight:      PainSc:    8     Isolation Precautions No active isolations  Medications Medications  fentaNYL  (SUBLIMAZE ) injection 50 mcg (has no administration in time range)  iohexol  (OMNIPAQUE ) 350 MG/ML injection 100 mL (100 mLs Intravenous Contrast Given 01/18/24 0003)  ketorolac  (TORADOL ) 30 MG/ML injection 30 mg (30 mg Intravenous Given 01/18/24 0056)    Mobility walks with person assist     Focused Assessments Cardiac Assessment Handoff:  Cardiac Rhythm: Normal sinus rhythm Lab Results  Component Value Date   TROPONINI 0.04 (H) 08/21/2015   No results found for: DDIMER Does the Patient currently have chest pain? Yes    R Recommendations: See Admitting Provider Note  Report given to:   Additional Notes:  Patient ishaveing genaralize pain also . Pt shares it is left chest, back pain.

## 2024-01-18 NOTE — ED Notes (Signed)
 [facility]  has arrived to transport the patient. Notified department 2C  of patient arrival.

## 2024-01-18 NOTE — TOC Transition Note (Signed)
 Transition of Care [facility]) - Discharge Note   Patient Details  Name: Aaron Marquez MRN: [id number] Date of Birth: 04/22/1956  Transition of Care [facility]) CM/SW Contact:  Jeani Mill, RN Phone Number: 01/18/2024, 12:34 PM   Clinical Narrative:     Patient stable for discharge.  No TOC needs at this time.  Final next level of care: Home/Self Care Barriers to Discharge: Barriers Resolved   Patient Goals and CMS Choice Patient states their goals for this hospitalization and ongoing recovery are:: Return home          Discharge Placement             Home          Discharge Plan and Services Additional resources added to the After Visit Summary for                                       Social Drivers of Health (SDOH) Interventions SDOH Screenings   Food Insecurity: Patient Declined (01/18/2024)  Housing: Low Risk  (01/18/2024)  Transportation Needs: No Transportation Needs (01/18/2024)  Utilities: Not At Risk (01/18/2024)  Financial Resource Strain: Low Risk  (12/26/2023)   Received from [facility]  Physical Activity: Unknown (12/26/2023)   Received from [facility]  Social Connections: Unknown (01/18/2024)  Stress: No Stress Concern Present (12/26/2023)   Received from [facility]  Tobacco Use: Low Risk  (01/18/2024)     Readmission Risk Interventions     No data to display

## 2024-01-18 NOTE — ED Provider Notes (Signed)
 [facility] Provider Note   CSN: [id number] Arrival date & time: 01/17/24  2122     Patient presents with: Chest Pain   Aaron Marquez is a 68 y.o. male.   The history is provided by the spouse and the patient.  Chest Pain Pain location:  Substernal area Pain quality: dull   Pain radiates to:  L shoulder, R shoulder, upper back, mid back, lower back, L arm and R arm Pain severity:  Moderate Onset quality:  Gradual Progression:  Waxing and waning Chronicity:  New Context: at rest   Relieved by:  Nothing Worsened by:  Nothing Ineffective treatments:  None tried Associated symptoms: shortness of breath   Associated symptoms: no altered mental status, no anorexia, no back pain, no fever, no lower extremity edema, no PND, no syncope, no vomiting and no weakness   Risk factors: male sex   Patient with GERD and HTN presents with [facility]  with radiation     Past Medical History:  Diagnosis Date   Back pain    GERD (gastroesophageal reflux disease)    Gout    Hypertension    Lumbar hernia    Pneumonia 07/31/12     Prior to Admission medications   Medication Sig Start Date End Date Taking? Authorizing Provider  aspirin  81 MG tablet Take 81 mg by mouth daily.    [provider]  atorvastatin (LIPITOR) 80 MG tablet Take 80 mg by mouth daily.    [provider]  carbidopa -levodopa  (SINEMET  IR) 25-100 MG tablet Take 1 tablet by mouth 3 (three) times daily. 06/21/23   Dohmeier, Raoul Byes, MD  COVID-19 mRNA bivalent vaccine, Moderna, (MODERNA COVID-19 BIVAL BOOSTER) 50 MCG/0.5ML injection Inject into the muscle. 07/25/21   Liane Redman, MD  COVID-19 mRNA vaccine, Moderna, (MODERNA COVID-19 VACCINE ) 100 MCG/0.5ML injection Inject into the muscle. 12/27/20   Liane Redman, MD  HYDROcodone -acetaminophen  (NORCO/VICODIN) 5-325 MG tablet Take 1-2 tablets by mouth every 6 (six) hours as needed. 01/10/23   Wynetta Heckle, MD   methocarbamol  (ROBAXIN ) 500 MG tablet Take 1 tablet (500 mg total) by mouth 2 (two) times daily. 07/31/19   Khatri, Hina, PA-C  methylPREDNISolone  (MEDROL  DOSEPAK) 4 MG TBPK tablet Follow package insert 08/04/22   Curatolo, Adam, DO  metoprolol  (LOPRESSOR ) 50 MG tablet Take 50 mg by mouth 2 (two) times daily.    [provider]  metoprolol  succinate (TOPROL -XL) 50 MG 24 hr tablet Take 50 mg by mouth 2 (two) times daily. Take with or immediately following a meal.    [provider]  omeprazole (PRILOSEC) 20 MG capsule Take 20 mg by mouth 2 (two) times daily.    [provider]  predniSONE  (STERAPRED UNI-PAK 21 TAB) 10 MG (21) TBPK tablet Take by mouth daily. Take 6 tabs by mouth daily  for 1 day, then 5 tabs for 1 day, then 4 tabs for 1 day, then 3 tabs for 1 day, 2 tabs for 1 day, then 1 tab by mouth daily for 1 day 12/17/21   Camellia Caves, MD  predniSONE  (STERAPRED UNI-PAK 21 TAB) 10 MG (21) TBPK tablet Take by mouth daily. Take 6 tabs by mouth daily  for 1 days, then 5 tabs for 2 days, then 4 tabs for 2 days, then 3 tabs for 2 days, 2 tabs for 2 days, then 1 tab by mouth daily for 2 days 01/10/23   Wynetta Heckle, MD  traMADol  (ULTRAM ) 50 MG tablet Take  1 tablet (50 mg total) by mouth every 6 (six) hours as needed. 05/08/18   Bart Born, MD    Allergies: Lisinopril     Review of Systems  Constitutional:  Negative for fever.  Respiratory:  Positive for shortness of breath.   Cardiovascular:  Positive for chest pain. Negative for syncope and PND.  Gastrointestinal:  Negative for anorexia and vomiting.  Musculoskeletal:  Positive for arthralgias. Negative for back pain.  Neurological:  Negative for weakness.  All other systems reviewed and are negative.   Updated Vital Signs BP (!) 149/77 (BP Location: Right Wrist)   Pulse 65   Temp 98.1 F (36.7 C) (Oral)   Resp 10   Wt 117.9 kg   SpO2 96%   BMI 34.30 kg/m   Physical Exam Vitals and nursing note  reviewed.  Constitutional:      General: He is not in acute distress.    Appearance: He is well-developed. He is not diaphoretic.  HENT:     Head: Normocephalic and atraumatic.     Nose: Nose normal.   Eyes:     Conjunctiva/sclera: Conjunctivae normal.     Pupils: Pupils are equal, round, and reactive to light.    Cardiovascular:     Rate and Rhythm: Normal rate and regular rhythm.     Pulses: Normal pulses.     Heart sounds: Normal heart sounds.  Pulmonary:     Effort: Pulmonary effort is normal.     Breath sounds: Normal breath sounds. No wheezing or rales.  Abdominal:     General: Bowel sounds are normal.     Palpations: Abdomen is soft.     Tenderness: There is no abdominal tenderness. There is no guarding or rebound.   Musculoskeletal:        General: Normal range of motion.     Cervical back: Normal range of motion and neck supple.   Skin:    General: Skin is warm and dry.   Neurological:     General: No focal deficit present.     Mental Status: He is alert and oriented to person, place, and time.     Deep Tendon Reflexes: Reflexes normal.   Psychiatric:        Mood and Affect: Mood normal.     (all labs ordered are listed, but only abnormal results are displayed) Results for orders placed or performed during the hospital encounter of 01/17/24  Basic metabolic panel   Collection Time: 01/17/24 11:08 PM  Result Value Ref Range   Sodium 142 135 - 145 mmol/L   Potassium 3.7 3.5 - 5.1 mmol/L   Chloride 105 98 - 111 mmol/L   CO2 24 22 - 32 mmol/L   Glucose, Bld 105 (H) 70 - 99 mg/dL   BUN 19 8 - 23 mg/dL   Creatinine, Ser 4.09 (H) 0.61 - 1.24 mg/dL   Calcium 8.9 8.9 - 81.1 mg/dL   GFR, Estimated 42 (L) >60 mL/min   Anion gap 13 5 - 15  CBC   Collection Time: 01/17/24 11:08 PM  Result Value Ref Range   WBC 5.3 4.0 - 10.5 K/uL   RBC 6.02 (H) 4.22 - 5.81 MIL/uL   Hemoglobin 15.5 13.0 - 17.0 g/dL   HCT 91.4 78.2 - 95.6 %   MCV 78.2 (L) 80.0 - 100.0 fL    MCH 25.7 (L) 26.0 - 34.0 pg   MCHC 32.9 30.0 - 36.0 g/dL   RDW 21.3 08.6 - 57.8 %  Platelets 160 150 - 400 K/uL   nRBC 0.0 0.0 - 0.2 %  Troponin T, High Sensitivity   Collection Time: 01/17/24 11:08 PM  Result Value Ref Range   Troponin T High Sensitivity 22 (H) <19 ng/L  Troponin T, High Sensitivity   Collection Time: 01/17/24 11:08 PM  Result Value Ref Range   Troponin T High Sensitivity 22 (H) <19 ng/L  Resp panel by RT-PCR (RSV, Flu A&B, Covid) Anterior Nasal Swab   Collection Time: 01/17/24 11:24 PM   Specimen: Anterior Nasal Swab  Result Value Ref Range   SARS Coronavirus 2 by RT PCR NEGATIVE NEGATIVE   Influenza A by PCR NEGATIVE NEGATIVE   Influenza B by PCR NEGATIVE NEGATIVE   Resp Syncytial Virus by PCR NEGATIVE NEGATIVE  Troponin T, High Sensitivity   Collection Time: 01/18/24  1:26 AM  Result Value Ref Range   Troponin T High Sensitivity 20 (H) <19 ng/L   CT Angio Chest/Abd/Pel for Dissection W and/or Wo Contrast Result Date: 01/18/2024 CLINICAL DATA:  Acute aortic syndrome (AAS) suspected 3. Chest pain, low back pain EXAM: CT ANGIOGRAPHY CHEST, ABDOMEN AND PELVIS TECHNIQUE: Non-contrast CT of the chest was initially obtained. Multidetector CT imaging through the chest, abdomen and pelvis was performed using the standard protocol during bolus administration of intravenous contrast. Multiplanar reconstructed images and MIPs were obtained and reviewed to evaluate the vascular anatomy. RADIATION DOSE REDUCTION: This exam was performed according to the departmental dose-optimization program which includes automated exposure control, adjustment of the mA and/or kV according to patient size and/or use of iterative reconstruction technique. CONTRAST:  OMNIPAQUE  IOHEXOL  350 MG/ML SOLN COMPARISON:  12/17/2021 FINDINGS: CTA CHEST FINDINGS Cardiovascular: Heart is normal size. Aorta is normal caliber. No dissection. Scattered aortic calcifications. No filling defects in the  pulmonary arteries to suggest pulmonary emboli. Mediastinum/Nodes: No mediastinal, hilar, or axillary adenopathy. Trachea and esophagus are unremarkable. Thyroid unremarkable. Lungs/Pleura: No confluent opacities or effusions. Musculoskeletal: Chest wall soft tissues are unremarkable. No acute bony abnormality. Review of the MIP images confirms the above findings. CTA ABDOMEN AND PELVIS FINDINGS VASCULAR Aorta: Normal caliber aorta without aneurysm, dissection, vasculitis or significant stenosis. Celiac: Patent without evidence of aneurysm, dissection, vasculitis or significant stenosis. SMA: Patent without evidence of aneurysm, dissection, vasculitis or significant stenosis. Renals: Both renal arteries are patent without evidence of aneurysm, dissection, vasculitis, fibromuscular dysplasia or significant stenosis. IMA: Patent without evidence of aneurysm, dissection, vasculitis or significant stenosis. Inflow: Patent without evidence of aneurysm, dissection, vasculitis or significant stenosis. Veins: No obvious venous abnormality within the limitations of this arterial phase study. Review of the MIP images confirms the above findings. NON-VASCULAR Hepatobiliary: Gallstones fill the gallbladder. No focal hepatic abnormality or biliary ductal dilatation. Pancreas: No focal hepatic abnormality.  Gallbladder unremarkable. Spleen: No focal abnormality or ductal dilatation. Adrenals/Urinary Tract: Adrenal glands normal. 4.5 cm cyst in the lower pole of the left kidney. No follow-up imaging recommended. No stones or hydronephrosis. Urinary bladder unremarkable. Stomach/Bowel: Few scattered colonic diverticula. No active diverticulitis. Normal appendix. Stomach and small bowel decompressed. No bowel obstruction or inflammatory process. Lymphatic: No adenopathy Reproductive: No visible focal abnormality. Other: No free fluid or free air. Moderate-sized right inguinal hernia containing small bowel loops. No bowel  obstruction. Musculoskeletal: No acute bony abnormality. Review of the MIP images confirms the above findings. IMPRESSION: No evidence of aortic aneurysm or dissection. No evidence of pulmonary embolus. No acute cardiopulmonary disease. Cholelithiasis.  No CT evidence of acute cholecystitis. Moderate-sized right  inguinal hernia containing small bowel loops. No evidence of bowel obstruction. Electronically Signed   By: Janeece Mechanic M.D.   On: 01/18/2024 00:30   DG Chest 2 View Result Date: 01/17/2024 CLINICAL DATA:  Chest pain EXAM: CHEST - 2 VIEW COMPARISON:  07/08/2022 FINDINGS: The heart size and mediastinal contours are within normal limits. Both lungs are clear. The visualized skeletal structures are unremarkable. IMPRESSION: No active cardiopulmonary disease. Electronically Signed   By: Esmeralda Hedge M.D.   On: 01/17/2024 21:55    EKG: EKG Interpretation Date/Time:  Thursday January 17 2024 21:33:49 EDT Ventricular Rate:  74 PR Interval:  216 QRS Duration:  76 QT Interval:  376 QTC Calculation: 417 R Axis:   -5  Text Interpretation: Sinus rhythm with 1st degree A-V block with Premature supraventricular complexes Cannot rule out Anterior infarct , age undetermined Abnormal ECG When compared with ECG of 30-Apr-2023 18:39, PREVIOUS ECG IS PRESENT Confirmed by Zackowski, Scott [phone]) on 01/17/2024 10:46:15 PM  Radiology: CT Angio Chest/Abd/Pel for Dissection W and/or Wo Contrast Result Date: 01/18/2024 CLINICAL DATA:  Acute aortic syndrome (AAS) suspected 3. Chest pain, low back pain EXAM: CT ANGIOGRAPHY CHEST, ABDOMEN AND PELVIS TECHNIQUE: Non-contrast CT of the chest was initially obtained. Multidetector CT imaging through the chest, abdomen and pelvis was performed using the standard protocol during bolus administration of intravenous contrast. Multiplanar reconstructed images and MIPs were obtained and reviewed to evaluate the vascular anatomy. RADIATION DOSE REDUCTION: This exam was performed  according to the departmental dose-optimization program which includes automated exposure control, adjustment of the mA and/or kV according to patient size and/or use of iterative reconstruction technique. CONTRAST:  OMNIPAQUE  IOHEXOL  350 MG/ML SOLN COMPARISON:  12/17/2021 FINDINGS: CTA CHEST FINDINGS Cardiovascular: Heart is normal size. Aorta is normal caliber. No dissection. Scattered aortic calcifications. No filling defects in the pulmonary arteries to suggest pulmonary emboli. Mediastinum/Nodes: No mediastinal, hilar, or axillary adenopathy. Trachea and esophagus are unremarkable. Thyroid unremarkable. Lungs/Pleura: No confluent opacities or effusions. Musculoskeletal: Chest wall soft tissues are unremarkable. No acute bony abnormality. Review of the MIP images confirms the above findings. CTA ABDOMEN AND PELVIS FINDINGS VASCULAR Aorta: Normal caliber aorta without aneurysm, dissection, vasculitis or significant stenosis. Celiac: Patent without evidence of aneurysm, dissection, vasculitis or significant stenosis. SMA: Patent without evidence of aneurysm, dissection, vasculitis or significant stenosis. Renals: Both renal arteries are patent without evidence of aneurysm, dissection, vasculitis, fibromuscular dysplasia or significant stenosis. IMA: Patent without evidence of aneurysm, dissection, vasculitis or significant stenosis. Inflow: Patent without evidence of aneurysm, dissection, vasculitis or significant stenosis. Veins: No obvious venous abnormality within the limitations of this arterial phase study. Review of the MIP images confirms the above findings. NON-VASCULAR Hepatobiliary: Gallstones fill the gallbladder. No focal hepatic abnormality or biliary ductal dilatation. Pancreas: No focal hepatic abnormality.  Gallbladder unremarkable. Spleen: No focal abnormality or ductal dilatation. Adrenals/Urinary Tract: Adrenal glands normal. 4.5 cm cyst in the lower pole of the left kidney. No follow-up  imaging recommended. No stones or hydronephrosis. Urinary bladder unremarkable. Stomach/Bowel: Few scattered colonic diverticula. No active diverticulitis. Normal appendix. Stomach and small bowel decompressed. No bowel obstruction or inflammatory process. Lymphatic: No adenopathy Reproductive: No visible focal abnormality. Other: No free fluid or free air. Moderate-sized right inguinal hernia containing small bowel loops. No bowel obstruction. Musculoskeletal: No acute bony abnormality. Review of the MIP images confirms the above findings. IMPRESSION: No evidence of aortic aneurysm or dissection. No evidence of pulmonary embolus. No acute cardiopulmonary disease. Cholelithiasis.  No CT evidence of acute cholecystitis. Moderate-sized right inguinal hernia containing small bowel loops. No evidence of bowel obstruction. Electronically Signed   By: Janeece Mechanic M.D.   On: 01/18/2024 00:30   DG Chest 2 View Result Date: 01/17/2024 CLINICAL DATA:  Chest pain EXAM: CHEST - 2 VIEW COMPARISON:  07/08/2022 FINDINGS: The heart size and mediastinal contours are within normal limits. Both lungs are clear. The visualized skeletal structures are unremarkable. IMPRESSION: No active cardiopulmonary disease. Electronically Signed   By: Esmeralda Hedge M.D.   On: 01/17/2024 21:55     Procedures   Medications Ordered in the ED  iohexol  (OMNIPAQUE ) 350 MG/ML injection 100 mL (100 mLs Intravenous Contrast Given 01/18/24 0003)  ketorolac  (TORADOL ) 30 MG/ML injection 30 mg (30 mg Intravenous Given 01/18/24 0056)  fentaNYL  (SUBLIMAZE ) injection 50 mcg (50 mcg Intravenous Given 01/18/24 0217)  aspirin  chewable tablet 324 mg (324 mg Oral Given 01/18/24 0248)                                    Medical Decision Making Patient with chest pain and shoulder pain and back pain   Amount and/or Complexity of Data Reviewed Independent Historian: spouse    Details: See above  External Data Reviewed: notes.    Details: Previous  notes reviewed  Labs: ordered.    Details: Troponin elevated 22. Negative covid and flu. Normal sodium 142, normal potassium 3.7, elevated creatinine 1.74 .  Normal white count 5.3, normal hemoglobin 15.5  Radiology: ordered and independent interpretation performed.    Details: No dissection by me  ECG/medicine tests: ordered and independent interpretation performed. Decision-making details documented in ED Course.  Risk OTC drugs. Prescription drug management. Decision regarding hospitalization.     Final diagnoses:  Chest pain, unspecified type   No signs of systemic illness or infection. The patient is nontoxic-appearing on exam and vital signs are within normal limits.  I have reviewed the triage vital signs and the nursing notes. Pertinent labs & imaging results that were available during my care of the patient were reviewed by me and considered in my medical decision making (see chart for details). After history, exam, and medical workup I feel the patient has been appropriately medically screened and is safe for discharge home. Pertinent diagnoses were discussed with the patient. Patient was given return precautions.    ED Discharge Orders     None          Milana Salay, MD 01/18/24 5409

## 2024-01-18 NOTE — Plan of Care (Signed)
  Problem: Education: Goal: Knowledge of General Education information will improve Description: Including pain rating scale, medication(s)/side effects and non-pharmacologic comfort measures Outcome: Progressing   Problem: Health Behavior/Discharge Planning: Goal: Ability to manage health-related needs will improve Outcome: Progressing   Problem: Clinical Measurements: Goal: Ability to maintain clinical measurements within normal limits will improve Outcome: Progressing Goal: Respiratory complications will improve Outcome: Progressing Goal: Cardiovascular complication will be avoided Outcome: Progressing   Problem: Nutrition: Goal: Adequate nutrition will be maintained Outcome: Progressing   Problem: Elimination: Goal: Will not experience complications related to bowel motility Outcome: Progressing Goal: Will not experience complications related to urinary retention Outcome: Progressing   Problem: Pain Managment: Goal: General experience of comfort will improve and/or be controlled Outcome: Progressing   Problem: Safety: Goal: Ability to remain free from injury will improve Outcome: Progressing   Problem: Skin Integrity: Goal: Risk for impaired skin integrity will decrease Outcome: Progressing

## 2024-01-18 NOTE — Discharge Summary (Signed)
 Physician Discharge Summary   Patient: Aaron Marquez MRN: [id number] DOB: [birth year]  Admit date:     01/17/2024  Discharge date: 01/18/24  Discharge Physician: Ezzard Holms   PCP: Stroud, Natalie M, FNP   Recommendations at discharge:  Follow-up with PCP  Discharge Diagnoses:   # )Musculoskeletal chest pain #) Essential Hypertension:  #) Hyperlipidemia:  #) CKD Stage 3B:  #) GERD #) Parkinson's disease:   Hospital Course: Aaron Marquez is a 68 y.o. male with medical history significant for essential hypertension, hyperlipidemia, Parkinson's disease, GERD, CKD 3B with baseline creatinine 1.6-2.0, who is admitted to [facility] on 01/17/2024 by way of transfer from [facility] with chest pain after presenting from home to the latter facility complaining of chest pain. CTA chest, abdomen, pelvis with dissection protocol showed no evidence of aortic aneurysm or dissection, no evidence of acute pulmonary embolism.   EKG shows no evidence of acute ischemic changes, including no evidence of STEMI, and troponins remain flat.  Echocardiogram did not show reduced EF.  Patient's chest pain was reproducible with tenderness to palpation of the anterior chest wall in the setting of recent heavy lifting given concerns of musculoskeletal chest pain.  Patient is therefore being discharged today on analgesia and will continue to follow-up with PCP.    Consultants: None Procedures performed: None Disposition: Home Diet recommendation:  Cardiac diet DISCHARGE MEDICATION: Allergies as of 01/18/2024       Reactions   Lisinopril  Nausea Only, Other (See Comments)   Makes me gag Makes me Gag Tingling in Throat Tingling in throat        Medication List     STOP taking these medications    predniSONE  10 MG (21) Tbpk tablet Commonly known as: STERAPRED UNI-PAK 21 TAB       TAKE these medications    acetaminophen  325 MG tablet Commonly known as: TYLENOL  Take 2  tablets (650 mg total) by mouth every 6 (six) hours as needed for mild pain (pain score 1-3) (or Fever >/= 101).   allopurinol 100 MG tablet Commonly known as: ZYLOPRIM Take 100 mg by mouth daily.   amLODipine 10 MG tablet Commonly known as: NORVASC Take 10 mg by mouth daily.   aspirin  81 MG tablet Take 81 mg by mouth daily.   atorvastatin 80 MG tablet Commonly known as: LIPITOR Take 80 mg by mouth daily.   carbidopa -levodopa  25-100 MG tablet Commonly known as: SINEMET  IR Take 1 tablet by mouth 3 (three) times daily. What changed: when to take this   HYDROcodone -acetaminophen  5-325 MG tablet Commonly known as: NORCO/VICODIN Take 1-2 tablets by mouth every 6 (six) hours as needed.   metoprolol  succinate 50 MG 24 hr tablet Commonly known as: TOPROL -XL Take 50 mg by mouth daily. Take with or immediately following a meal.   pantoprazole  40 MG tablet Commonly known as: PROTONIX  Take 40 mg by mouth daily.   telmisartan 20 MG tablet Commonly known as: MICARDIS Take 20 mg by mouth daily.        Discharge Exam: Filed Weights   01/17/24 2127 01/18/24 0336  Weight: 117.9 kg 119.8 kg   General: appears to be stated age; alert, oriented Skin: warm, dry, no rash Head:  AT/Shields Mouth:  Oral mucosa membranes appear moist, normal dentition Neck: supple; trachea midline Chest: Tenderness to palpation on the anterior chest wall Heart:  RRR; did not appreciate any M/R/G Lungs: CTAB, did not appreciate any wheezes, rales, or rhonchi Abdomen: +  BS; soft, ND, NT Vascular: 2+ pedal pulses b/l; 2+ radial pulses b/l Extremities: no peripheral edema, no muscle wasting Neuro: strength and sensation intact in upper and lower extremities b/l    Condition at discharge: good  The results of significant diagnostics from this hospitalization (including imaging, microbiology, ancillary and laboratory) are listed below for reference.   Imaging Studies: ECHOCARDIOGRAM COMPLETE Result  Date: 01/18/2024    ECHOCARDIOGRAM REPORT   Patient Name:   Aaron Marquez Date of Exam: 01/18/2024 Medical Rec #:  [id number]     Height:       71.0 in Accession #:    [id number]    Weight:       264.1 lb Date of Birth:  08-12-1955     BSA:          2.373 m Patient Age:    67 years      BP:           144/91 mmHg Patient Gender: M             HR:           59 bpm. Exam Location:  Inpatient Procedure: Intracardiac Opacification Agent, Color Doppler, Cardiac Doppler and            2D Echo (Both Spectral and Color Flow Doppler were utilized during            procedure). Indications:    chest pain  History:        Patient has no prior history of Echocardiogram examinations.                 Risk Factors:Hypertension and Dyslipidemia.  Sonographer:    Griselda Lederer Referring Phys: [id number] JUSTIN B HOWERTER IMPRESSIONS  1. Left ventricular ejection fraction, by estimation, is 65 to 70%. The left ventricle has normal function. The left ventricle has no regional wall motion abnormalities. There is moderate left ventricular hypertrophy. Left ventricular diastolic parameters are indeterminate.  2. Right ventricular systolic function is normal. The right ventricular size is normal. Tricuspid regurgitation signal is inadequate for assessing PA pressure.  3. Left atrial size was mildly dilated.  4. The mitral valve is normal in structure. Trivial mitral valve regurgitation.  5. The aortic valve was not well visualized. Aortic valve regurgitation is not visualized. No aortic stenosis is present. FINDINGS  Left Ventricle: Left ventricular ejection fraction, by estimation, is 65 to 70%. The left ventricle has normal function. The left ventricle has no regional wall motion abnormalities. The left ventricular internal cavity size was normal in size. There is  moderate left ventricular hypertrophy. Left ventricular diastolic parameters are indeterminate. Right Ventricle: The right ventricular size is normal. No increase in right  ventricular wall thickness. Right ventricular systolic function is normal. Tricuspid regurgitation signal is inadequate for assessing PA pressure. Left Atrium: Left atrial size was mildly dilated. Right Atrium: Right atrial size was not well visualized. Pericardium: There is no evidence of pericardial effusion. Mitral Valve: The mitral valve is normal in structure. Trivial mitral valve regurgitation. Tricuspid Valve: The tricuspid valve is normal in structure. Tricuspid valve regurgitation is trivial. Aortic Valve: The aortic valve was not well visualized. Aortic valve regurgitation is not visualized. No aortic stenosis is present. Pulmonic Valve: The pulmonic valve was not well visualized. Pulmonic valve regurgitation is not visualized. Aorta: The aortic root and ascending aorta are structurally normal, with no evidence of dilitation. IAS/Shunts: The interatrial septum was not well visualized.  LEFT VENTRICLE PLAX 2D  LVIDd:         4.75 cm   Diastology LVIDs:         3.40 cm   LV e' medial:    6.10 cm/s LV PW:         0.95 cm   LV E/e' medial:  8.4 LV IVS:        1.10 cm   LV e' lateral:   6.95 cm/s LVOT diam:     2.10 cm   LV E/e' lateral: 7.4 LVOT Area:     3.46 cm  RIGHT VENTRICLE RV S prime:     11.20 cm/s TAPSE (M-mode): 1.9 cm LEFT ATRIUM           Index LA diam:      4.10 cm 1.73 cm/m LA Vol (A4C): 95.9 ml 40.41 ml/m   AORTA Ao Root diam: 3.10 cm Ao Asc diam:  2.90 cm MITRAL VALVE MV Area (PHT): 3.68 cm    SHUNTS MV Decel Time: 206 msec    Systemic Diam: 2.10 cm MV E velocity: 51.40 cm/s MV A velocity: 54.20 cm/s MV E/A ratio:  0.95 Carson Clara MD Electronically signed by Carson Clara MD Signature Date/Time: 01/18/2024/11:24:37 AM    Final    CT Angio Chest/Abd/Pel for Dissection W and/or Wo Contrast Result Date: 01/18/2024 CLINICAL DATA:  Acute aortic syndrome (AAS) suspected 3. Chest pain, low back pain EXAM: CT ANGIOGRAPHY CHEST, ABDOMEN AND PELVIS TECHNIQUE: Non-contrast CT of the  chest was initially obtained. Multidetector CT imaging through the chest, abdomen and pelvis was performed using the standard protocol during bolus administration of intravenous contrast. Multiplanar reconstructed images and MIPs were obtained and reviewed to evaluate the vascular anatomy. RADIATION DOSE REDUCTION: This exam was performed according to the departmental dose-optimization program which includes automated exposure control, adjustment of the mA and/or kV according to patient size and/or use of iterative reconstruction technique. CONTRAST:  OMNIPAQUE  IOHEXOL  350 MG/ML SOLN COMPARISON:  12/17/2021 FINDINGS: CTA CHEST FINDINGS Cardiovascular: Heart is normal size. Aorta is normal caliber. No dissection. Scattered aortic calcifications. No filling defects in the pulmonary arteries to suggest pulmonary emboli. Mediastinum/Nodes: No mediastinal, hilar, or axillary adenopathy. Trachea and esophagus are unremarkable. Thyroid unremarkable. Lungs/Pleura: No confluent opacities or effusions. Musculoskeletal: Chest wall soft tissues are unremarkable. No acute bony abnormality. Review of the MIP images confirms the above findings. CTA ABDOMEN AND PELVIS FINDINGS VASCULAR Aorta: Normal caliber aorta without aneurysm, dissection, vasculitis or significant stenosis. Celiac: Patent without evidence of aneurysm, dissection, vasculitis or significant stenosis. SMA: Patent without evidence of aneurysm, dissection, vasculitis or significant stenosis. Renals: Both renal arteries are patent without evidence of aneurysm, dissection, vasculitis, fibromuscular dysplasia or significant stenosis. IMA: Patent without evidence of aneurysm, dissection, vasculitis or significant stenosis. Inflow: Patent without evidence of aneurysm, dissection, vasculitis or significant stenosis. Veins: No obvious venous abnormality within the limitations of this arterial phase study. Review of the MIP images confirms the above findings.  NON-VASCULAR Hepatobiliary: Gallstones fill the gallbladder. No focal hepatic abnormality or biliary ductal dilatation. Pancreas: No focal hepatic abnormality.  Gallbladder unremarkable. Spleen: No focal abnormality or ductal dilatation. Adrenals/Urinary Tract: Adrenal glands normal. 4.5 cm cyst in the lower pole of the left kidney. No follow-up imaging recommended. No stones or hydronephrosis. Urinary bladder unremarkable. Stomach/Bowel: Few scattered colonic diverticula. No active diverticulitis. Normal appendix. Stomach and small bowel decompressed. No bowel obstruction or inflammatory process. Lymphatic: No adenopathy Reproductive: No visible focal abnormality. Other: No free fluid or free air.  Moderate-sized right inguinal hernia containing small bowel loops. No bowel obstruction. Musculoskeletal: No acute bony abnormality. Review of the MIP images confirms the above findings. IMPRESSION: No evidence of aortic aneurysm or dissection. No evidence of pulmonary embolus. No acute cardiopulmonary disease. Cholelithiasis.  No CT evidence of acute cholecystitis. Moderate-sized right inguinal hernia containing small bowel loops. No evidence of bowel obstruction. Electronically Signed   By: Janeece Mechanic M.D.   On: 01/18/2024 00:30   DG Chest 2 View Result Date: 01/17/2024 CLINICAL DATA:  Chest pain EXAM: CHEST - 2 VIEW COMPARISON:  07/08/2022 FINDINGS: The heart size and mediastinal contours are within normal limits. Both lungs are clear. The visualized skeletal structures are unremarkable. IMPRESSION: No active cardiopulmonary disease. Electronically Signed   By: Esmeralda Hedge M.D.   On: 01/17/2024 21:55    Microbiology: Results for orders placed or performed during the hospital encounter of 01/17/24  Resp panel by RT-PCR (RSV, Flu A&B, Covid) Anterior Nasal Swab     Status: None   Collection Time: 01/17/24 11:24 PM   Specimen: Anterior Nasal Swab  Result Value Ref Range Status   SARS Coronavirus 2 by RT  PCR NEGATIVE NEGATIVE Final    Comment: (NOTE) SARS-CoV-2 target nucleic acids are NOT DETECTED.  The SARS-CoV-2 RNA is generally detectable in upper respiratory specimens during the acute phase of infection. The lowest concentration of SARS-CoV-2 viral copies this assay can detect is 138 copies/mL. A negative result does not preclude SARS-Cov-2 infection and should not be used as the sole basis for treatment or other patient management decisions. A negative result may occur with  improper specimen collection/handling, submission of specimen other than nasopharyngeal swab, presence of viral mutation(s) within the areas targeted by this assay, and inadequate number of viral copies(<138 copies/mL). A negative result must be combined with clinical observations, patient history, and epidemiological information. The expected result is Negative.  Fact Sheet for Patients:  BloggerCourse.com  Fact Sheet for Healthcare Providers:  SeriousBroker.it  This test is no t yet approved or cleared by the [place]  FDA and  has been authorized for detection and/or diagnosis of SARS-CoV-2 by FDA under an Emergency Use Authorization (EUA). This EUA will remain  in effect (meaning this test can be used) for the duration of the COVID-19 declaration under Section 564(b)(1) of the Act, 21 U.S.C.section 360bbb-3(b)(1), unless the authorization is terminated  or revoked sooner.       Influenza A by PCR NEGATIVE NEGATIVE Final   Influenza B by PCR NEGATIVE NEGATIVE Final    Comment: (NOTE) The Xpert Xpress SARS-CoV-2/FLU/RSV plus assay is intended as an aid in the diagnosis of influenza from Nasopharyngeal swab specimens and should not be used as a sole basis for treatment. Nasal washings and aspirates are unacceptable for Xpert Xpress SARS-CoV-2/FLU/RSV testing.  Fact Sheet for Patients: BloggerCourse.com  Fact Sheet for  Healthcare Providers: SeriousBroker.it  This test is not yet approved or cleared by the [place]  FDA and has been authorized for detection and/or diagnosis of SARS-CoV-2 by FDA under an Emergency Use Authorization (EUA). This EUA will remain in effect (meaning this test can be used) for the duration of the COVID-19 declaration under Section 564(b)(1) of the Act, 21 U.S.C. section 360bbb-3(b)(1), unless the authorization is terminated or revoked.     Resp Syncytial Virus by PCR NEGATIVE NEGATIVE Final    Comment: (NOTE) Fact Sheet for Patients: BloggerCourse.com  Fact Sheet for Healthcare Providers: SeriousBroker.it  This test is not yet approved or  cleared by the [place]  FDA and has been authorized for detection and/or diagnosis of SARS-CoV-2 by FDA under an Emergency Use Authorization (EUA). This EUA will remain in effect (meaning this test can be used) for the duration of the COVID-19 declaration under Section 564(b)(1) of the Act, 21 U.S.C. section 360bbb-3(b)(1), unless the authorization is terminated or revoked.  Performed at [facility], [address] [place], [place] [address]   MRSA Next Gen by PCR, Nasal     Status: None   Collection Time: 01/18/24  3:39 AM   Specimen: Nasal Mucosa; Nasal Swab  Result Value Ref Range Status   MRSA by PCR Next Gen NOT DETECTED NOT DETECTED Final    Comment: (NOTE) The GeneXpert MRSA Assay (FDA approved for NASAL specimens only), is one component of a comprehensive MRSA colonization surveillance program. It is not intended to diagnose MRSA infection nor to guide or monitor treatment for MRSA infections. Test performance is not FDA approved in patients less than 5 years old. Performed at [facility], [address], [place], [place] [address]     Labs: CBC: Recent Labs  Lab 01/17/24 2308 01/18/24 0350  WBC 5.3 4.6   NEUTROABS  --  2.6  HGB 15.5 16.0  HCT 47.1 48.7  MCV 78.2* 78.7*  PLT 160 159   Basic Metabolic Panel: Recent Labs  Lab 01/17/24 2308 01/18/24 0350  NA 142 138  K 3.7 3.8  CL 105 105  CO2 24 22  GLUCOSE 105* 134*  BUN 19 16  CREATININE 1.74* 1.72*  CALCIUM 8.9 8.9  MG  --  1.9   Liver Function Tests: Recent Labs  Lab 01/18/24 0350  AST 21  ALT 19  ALKPHOS 65  BILITOT 1.1  PROT 6.9  ALBUMIN 3.9   CBG: No results for input(s): GLUCAP in the last 168 hours.  Discharge time spent:  37 minutes.  Signed: Ezzard Holms, MD [facility] 01/18/2024

## 2024-01-18 NOTE — Progress Notes (Signed)
 Mobility Specialist Progress Note;   01/18/24 1032  Mobility  Activity Ambulated with assistance in hallway  Level of Assistance Minimal assist, patient does 75% or more  Assistive Device Front wheel walker  Distance Ambulated (ft) 40 ft  Activity Response Tolerated well  Mobility Referral Yes  Mobility visit 1 Mobility  Mobility Specialist Start Time (ACUTE ONLY) 1032  Mobility Specialist Stop Time (ACUTE ONLY) 1054  Mobility Specialist Time Calculation (min) (ACUTE ONLY) 22 min   RN requesting pt to ambulate, pt agreeable. Required MinA for bed mobility and MinG during ambulation. VSS throughout. C/o R sciatic nerve pain, requested pain meds once returned back to room. Pt left comfortably in bed with all needs met, call bell in reach. RN notified.   Janit Meline Mobility Specialist Please contact via SecureChat or [employer] [phone]

## 2024-01-18 NOTE — Care Management Obs Status (Signed)
 MEDICARE OBSERVATION STATUS NOTIFICATION   Patient Details  Name: Aaron Marquez MRN: [id number] Date of Birth: 1955-10-28   Medicare Observation Status Notification Given:  Yes  Copy given letter signed  Wynonia Hedges 01/18/2024, 10:14 AM

## 2024-01-18 NOTE — TOC CM/SW Note (Signed)
 Transition of Care [facility]) - Inpatient Brief Assessment   Patient Details  Name: Aaron Marquez MRN: [id number] Date of Birth: September 09, 1955  [facility] CM/SW Contact:    Juliane Och, LCSW Phone Number: 01/18/2024, 8:52 AM   Clinical Narrative:  8:52 AM Per chart review, patient resides at home with spouse. Patient has a PCP and insurance. Patient does not have SNF/HH/DME history. Patient's preferred pharmacy is [facility] [address]. No TOC needs were identified at this time. TOC will continue to follow and be available to assist.  Transition of Care Asessment: Insurance and Status: Insurance coverage has been reviewed Patient has primary care physician: Yes Home environment has been reviewed: Private Residence Prior level of function:: N/A Prior/Current Home Services: No current home services Social Drivers of Health Review: SDOH reviewed no interventions necessary Readmission risk has been reviewed: Yes Transition of care needs: no transition of care needs at this time

## 2024-02-04 ENCOUNTER — Encounter (HOSPITAL_BASED_OUTPATIENT_CLINIC_OR_DEPARTMENT_OTHER): Payer: Self-pay | Admitting: Emergency Medicine

## 2024-02-04 ENCOUNTER — Emergency Department (HOSPITAL_BASED_OUTPATIENT_CLINIC_OR_DEPARTMENT_OTHER)

## 2024-02-04 ENCOUNTER — Emergency Department (HOSPITAL_BASED_OUTPATIENT_CLINIC_OR_DEPARTMENT_OTHER)
Admission: EM | Admit: 2024-02-04 | Discharge: 2024-02-04 | Disposition: A | Discharge status: Home / Self Care | Attending: Emergency Medicine | Admitting: Emergency Medicine

## 2024-02-04 ENCOUNTER — Other Ambulatory Visit: Payer: Self-pay

## 2024-02-04 DIAGNOSIS — M5441 Lumbago with sciatica, right side: Secondary | ICD-10-CM | POA: Diagnosis present

## 2024-02-04 DIAGNOSIS — R109 Unspecified abdominal pain: Secondary | ICD-10-CM | POA: Diagnosis not present

## 2024-02-04 DIAGNOSIS — I10 Essential (primary) hypertension: Secondary | ICD-10-CM | POA: Diagnosis not present

## 2024-02-04 MED ORDER — METHOCARBAMOL 500 MG PO TABS: 500.0000 mg | ORAL_TABLET | Freq: Three times a day (TID) | ORAL | 0 refills | Status: AC | PRN

## 2024-02-04 MED ORDER — PREDNISONE 20 MG PO TABS
40.0000 mg | ORAL_TABLET | Freq: Once | ORAL | Status: AC
  Administered 2024-02-04: 40 mg via ORAL
  Filled 2024-02-04: qty 2

## 2024-02-04 MED ORDER — METHOCARBAMOL 500 MG PO TABS
500.0000 mg | ORAL_TABLET | Freq: Once | ORAL | Status: AC
  Administered 2024-02-04: 500 mg via ORAL
  Filled 2024-02-04: qty 1

## 2024-02-04 MED ORDER — PREDNISONE 20 MG PO TABS: 40.0000 mg | ORAL_TABLET | Freq: Every day | ORAL | 0 refills | Status: AC

## 2024-02-04 MED ORDER — MORPHINE SULFATE (PF) 4 MG/ML IV SOLN
4.0000 mg | Freq: Once | INTRAVENOUS | Status: AC
  Administered 2024-02-04: 4 mg via INTRAMUSCULAR
  Filled 2024-02-04: qty 1

## 2024-02-04 NOTE — ED Triage Notes (Signed)
 Pt POV in wheelchair- c/o BL lower back pain radiating down R leg, hip x2 weeks. Denies falls, known injury. Denies urinary sx.

## 2024-02-04 NOTE — Discharge Instructions (Signed)
You have been seen in the Emergency Department (ED)  today for back pain.  Your workup and exam have not shown any acute abnormalities and you are likely suffering from muscle strain or possible problems with your discs, but there is no treatment that will fix your symptoms at this time.    Please follow up with your doctor as soon as possible regarding today's ED visit and your back pain.  Return to the ED for worsening back pain, fever, weakness or numbness of either leg, or if you develop either (1) an inability to urinate or have bowel movements, or (2) loss of your ability to control your bathroom functions (if you start having "accidents"), or if you develop other new symptoms that concern you.  

## 2024-02-04 NOTE — ED Notes (Signed)
 Patient transported to CT

## 2024-02-04 NOTE — ED Notes (Signed)
 Patient returned from CT

## 2024-02-04 NOTE — ED Provider Notes (Signed)
 Emergency Department Provider Note   I have reviewed the triage vital signs and the nursing notes.   HISTORY  Chief Complaint Back Pain   HPI Aaron Marquez is a 68 y.o. male with past history of hypertension and prior back pain with slipped disc presents to the emergency department with acute onset lower back pain radiating to the right buttock and leg.  No numbness or weakness.  No specific injury.  Feels some pain radiating into the hip as well.  He is able to walk but with discomfort.  No fevers or chills. No groin numbness. No UTI symptoms.    Past Medical History:  Diagnosis Date   Back pain    GERD (gastroesophageal reflux disease)    Gout    Hypertension    Lumbar hernia    Pneumonia 07/31/12    Review of Systems  Constitutional: No fever/chills Cardiovascular: Denies chest pain. Respiratory: Denies shortness of breath. Gastrointestinal: No abdominal pain.  No nausea, no vomiting.   Genitourinary: Negative for dysuria. Musculoskeletal: Positive back and hip pain.  Skin: Negative for rash. Neurological: Negative for headaches, focal weakness or numbness.   ____________________________________________   PHYSICAL EXAM:  VITAL SIGNS: ED Triage Vitals  Encounter Vitals Group     BP 02/04/24 2044 (!) 155/91     Pulse Rate 02/04/24 2044 74     Resp 02/04/24 2044 18     Temp 02/04/24 2044 98.5 F (36.9 C)     Temp src --      SpO2 02/04/24 2044 93 %     Weight 02/04/24 2044 264 lb (119.7 kg)     Height 02/04/24 2044 5' 11 (1.803 m)   Constitutional: Alert and oriented. Well appearing and in no acute distress. Eyes: Conjunctivae are normal.  Head: Atraumatic. Nose: No congestion/rhinnorhea. Mouth/Throat: Mucous membranes are moist.  Neck: No stridor.   Cardiovascular: Normal rate, regular rhythm. Good peripheral circulation. Grossly normal heart sounds.   Respiratory: Normal respiratory effort.  No retractions. Lungs CTAB. Gastrointestinal: Soft and  nontender. No distention.  Musculoskeletal: No lower extremity tenderness nor edema. No gross deformities of extremities. Normal ROM of the right hip.  Neurologic:  Normal speech and language. No gross focal neurologic deficits are appreciated. Normal strength and sensation in the bilateral LEs.  Skin:  Skin is warm, dry and intact. No rash noted.  ____________________________________________  RADIOLOGY  No results found.  ____________________________________________   PROCEDURES  Procedure(s) performed:   Procedures   ____________________________________________   INITIAL IMPRESSION / ASSESSMENT AND PLAN / ED COURSE  Pertinent labs & imaging results that were available during my care of the patient were reviewed by me and considered in my medical decision making (see chart for details).   This patient is Presenting for Evaluation of back pain, which does require a range of treatment options, and is a complaint that involves a high risk of morbidity and mortality.  The Differential Diagnoses includes but is not exclusive to musculoskeletal back pain, renal colic, urinary tract infection, pyelonephritis, intra-abdominal causes of back pain, aortic aneurysm or dissection, cauda equina syndrome, sciatica, lumbar disc disease, thoracic disc disease, etc.   Critical Interventions-    Medications  morphine  (PF) 4 MG/ML injection 4 mg (4 mg Intramuscular Given 02/04/24 2147)    Reassessment after intervention: pain improved.   Radiologic Tests Ordered, included CT abdomen/and L spine. I independently interpreted the images and agree with radiology interpretation.   Medical Decision Making: Summary:  Patient presents emergency  department with lower back pain rating to the right hip.  Sound most consistent with sciatica but given patient's age and some other factors including some radiation to the right groin plan for CT abdomen pelvis without contrast to rule out ureteral stone  along with no charge L-spine CT.  No red flag signs or symptoms to prompt emergent MRI at this time.  Reevaluation with update and discussion with   ***Considered admission***  Patient's presentation is most consistent with acute presentation with potential threat to life or bodily function.   Disposition:   ____________________________________________  FINAL CLINICAL IMPRESSION(S) / ED DIAGNOSES  Final diagnoses:  None     NEW OUTPATIENT MEDICATIONS STARTED DURING THIS VISIT:  New Prescriptions   No medications on file    Note:  This document was prepared using Dragon voice recognition software and may include unintentional dictation errors.  Fonda Law, MD, [facility]

## 2024-03-26 ENCOUNTER — Encounter (HOSPITAL_BASED_OUTPATIENT_CLINIC_OR_DEPARTMENT_OTHER): Payer: Self-pay

## 2024-03-26 ENCOUNTER — Other Ambulatory Visit: Payer: Self-pay

## 2024-03-26 ENCOUNTER — Emergency Department (HOSPITAL_BASED_OUTPATIENT_CLINIC_OR_DEPARTMENT_OTHER)

## 2024-03-26 ENCOUNTER — Inpatient Hospital Stay (HOSPITAL_BASED_OUTPATIENT_CLINIC_OR_DEPARTMENT_OTHER)
Admission: EM | Admit: 2024-03-26 | Discharge: 2024-04-02 | DRG: 520 | Disposition: A | Discharge status: Skilled Nursing Facility | Attending: Internal Medicine | Admitting: Internal Medicine

## 2024-03-26 DIAGNOSIS — G20A1 Parkinson's disease without dyskinesia, without mention of fluctuations: Secondary | ICD-10-CM | POA: Diagnosis present

## 2024-03-26 DIAGNOSIS — G252 Other specified forms of tremor: Secondary | ICD-10-CM | POA: Diagnosis present

## 2024-03-26 DIAGNOSIS — M549 Dorsalgia, unspecified: Secondary | ICD-10-CM | POA: Diagnosis present

## 2024-03-26 DIAGNOSIS — Z8739 Personal history of other diseases of the musculoskeletal system and connective tissue: Secondary | ICD-10-CM

## 2024-03-26 DIAGNOSIS — Z79899 Other long term (current) drug therapy: Secondary | ICD-10-CM

## 2024-03-26 DIAGNOSIS — R4182 Altered mental status, unspecified: Secondary | ICD-10-CM | POA: Diagnosis present

## 2024-03-26 DIAGNOSIS — Z8701 Personal history of pneumonia (recurrent): Secondary | ICD-10-CM

## 2024-03-26 DIAGNOSIS — R7989 Other specified abnormal findings of blood chemistry: Secondary | ICD-10-CM | POA: Diagnosis present

## 2024-03-26 DIAGNOSIS — I10 Essential (primary) hypertension: Secondary | ICD-10-CM | POA: Insufficient documentation

## 2024-03-26 DIAGNOSIS — K219 Gastro-esophageal reflux disease without esophagitis: Secondary | ICD-10-CM | POA: Diagnosis present

## 2024-03-26 DIAGNOSIS — D696 Thrombocytopenia, unspecified: Secondary | ICD-10-CM | POA: Diagnosis present

## 2024-03-26 DIAGNOSIS — M109 Gout, unspecified: Secondary | ICD-10-CM | POA: Diagnosis present

## 2024-03-26 DIAGNOSIS — Z888 Allergy status to other drugs, medicaments and biological substances status: Secondary | ICD-10-CM

## 2024-03-26 DIAGNOSIS — M5104 Intervertebral disc disorders with myelopathy, thoracic region: Principal | ICD-10-CM | POA: Diagnosis present

## 2024-03-26 DIAGNOSIS — Z7982 Long term (current) use of aspirin: Secondary | ICD-10-CM

## 2024-03-26 DIAGNOSIS — Z9889 Other specified postprocedural states: Secondary | ICD-10-CM

## 2024-03-26 DIAGNOSIS — Z886 Allergy status to analgesic agent status: Secondary | ICD-10-CM

## 2024-03-26 DIAGNOSIS — Z6834 Body mass index (BMI) 34.0-34.9, adult: Secondary | ICD-10-CM

## 2024-03-26 DIAGNOSIS — M4804 Spinal stenosis, thoracic region: Secondary | ICD-10-CM | POA: Diagnosis present

## 2024-03-26 DIAGNOSIS — G8929 Other chronic pain: Secondary | ICD-10-CM | POA: Diagnosis present

## 2024-03-26 DIAGNOSIS — E785 Hyperlipidemia, unspecified: Secondary | ICD-10-CM | POA: Diagnosis present

## 2024-03-26 DIAGNOSIS — N1832 Chronic kidney disease, stage 3b: Secondary | ICD-10-CM | POA: Diagnosis present

## 2024-03-26 DIAGNOSIS — E66811 Obesity, class 1: Secondary | ICD-10-CM | POA: Diagnosis present

## 2024-03-26 DIAGNOSIS — I129 Hypertensive chronic kidney disease with stage 1 through stage 4 chronic kidney disease, or unspecified chronic kidney disease: Secondary | ICD-10-CM | POA: Diagnosis present

## 2024-03-26 DIAGNOSIS — G9340 Encephalopathy, unspecified: Principal | ICD-10-CM

## 2024-03-26 LAB — URINALYSIS, W/ REFLEX TO CULTURE (INFECTION SUSPECTED)
Bilirubin Urine: NEGATIVE
Glucose, UA: NEGATIVE mg/dL
Hgb urine dipstick: NEGATIVE
Ketones, ur: NEGATIVE mg/dL
Leukocytes,Ua: NEGATIVE
Nitrite: NEGATIVE
Protein, ur: NEGATIVE mg/dL
RBC / HPF: NONE SEEN RBC/hpf (ref 0–5)
Specific Gravity, Urine: 1.025 (ref 1.005–1.030)
WBC, UA: NONE SEEN WBC/hpf (ref 0–5)
pH: 5.5 (ref 5.0–8.0)

## 2024-03-26 LAB — COMPREHENSIVE METABOLIC PANEL WITH GFR
ALT: 17 U/L (ref 0–44)
AST: 20 U/L (ref 15–41)
Albumin: 4.1 g/dL (ref 3.5–5.0)
Alkaline Phosphatase: 77 U/L (ref 38–126)
Anion gap: 12 (ref 5–15)
BUN: 21 mg/dL (ref 8–23)
CO2: 23 mmol/L (ref 22–32)
Calcium: 9 mg/dL (ref 8.9–10.3)
Chloride: 104 mmol/L (ref 98–111)
Creatinine, Ser: 1.89 mg/dL — ABNORMAL HIGH (ref 0.61–1.24)
GFR, Estimated: 38 mL/min — ABNORMAL LOW (ref >60)
Glucose, Bld: 98 mg/dL (ref 70–99)
Potassium: 4.2 mmol/L (ref 3.5–5.1)
Sodium: 139 mmol/L (ref 135–145)
Total Bilirubin: 0.6 mg/dL (ref 0.0–1.2)
Total Protein: 6.7 g/dL (ref 6.5–8.1)

## 2024-03-26 LAB — ETHANOL: Alcohol, Ethyl (B): <15 mg/dL (ref <15)

## 2024-03-26 LAB — CBC WITH DIFFERENTIAL/PLATELET
Abs Immature Granulocytes: 0.01 K/uL (ref 0.00–0.07)
Basophils Absolute: 0 K/uL (ref 0.0–0.1)
Basophils Relative: 0 %
Eosinophils Absolute: 0.1 K/uL (ref 0.0–0.5)
Eosinophils Relative: 2 %
HCT: 49.5 % (ref 39.0–52.0)
Hemoglobin: 16.6 g/dL (ref 13.0–17.0)
Immature Granulocytes: 0 %
Lymphocytes Relative: 37 %
Lymphs Abs: 1.7 K/uL (ref 0.7–4.0)
MCH: 26.4 pg (ref 26.0–34.0)
MCHC: 33.5 g/dL (ref 30.0–36.0)
MCV: 78.8 fL — ABNORMAL LOW (ref 80.0–100.0)
Monocytes Absolute: 0.4 K/uL (ref 0.1–1.0)
Monocytes Relative: 10 %
Neutro Abs: 2.3 K/uL (ref 1.7–7.7)
Neutrophils Relative %: 51 %
Platelets: 169 K/uL (ref 150–400)
RBC: 6.28 MIL/uL — ABNORMAL HIGH (ref 4.22–5.81)
RDW: 13.4 % (ref 11.5–15.5)
WBC: 4.6 K/uL (ref 4.0–10.5)
nRBC: 0 % (ref 0.0–0.2)

## 2024-03-26 LAB — URINE DRUG SCREEN
Amphetamines: NOT DETECTED
Barbiturates: NOT DETECTED
Benzodiazepines: NOT DETECTED
Cocaine: NOT DETECTED
Fentanyl: NOT DETECTED
Methadone Scn, Ur: NOT DETECTED
Opiates: NOT DETECTED
Tetrahydrocannabinol: NOT DETECTED

## 2024-03-26 LAB — TROPONIN T, HIGH SENSITIVITY
Troponin T High Sensitivity: 25 ng/L — ABNORMAL HIGH (ref 0–19)
Troponin T High Sensitivity: 26 ng/L — ABNORMAL HIGH (ref 0–19)

## 2024-03-26 LAB — CBG MONITORING, ED: Glucose-Capillary: 85 mg/dL (ref 70–99)

## 2024-03-26 LAB — LACTIC ACID, PLASMA: Lactic Acid, Venous: 1.6 mmol/L (ref 0.5–1.9)

## 2024-03-26 MED ORDER — CARBIDOPA-LEVODOPA 25-100 MG PO TABS
1.0000 | ORAL_TABLET | Freq: Two times a day (BID) | ORAL | Status: DC
  Administered 2024-03-27 – 2024-04-02 (×15): 1 via ORAL
  Filled 2024-03-26 (×15): qty 1

## 2024-03-26 MED ORDER — HYDRALAZINE HCL 20 MG/ML IJ SOLN: 10.0000 mg | Freq: Four times a day (QID) | INTRAMUSCULAR | Status: DC | PRN

## 2024-03-26 NOTE — Plan of Care (Signed)
 [facility] to [employer] [facility] transfer:  68 year old man past medical history of essential hypertension, hyperlipidemia, Parkinson disease, GERD, CKD 3B, and hyperlipidemia presented to emergency department accompanied by patient's wife having a spell of left-sided arm numbness and altered mental status.  History is obtained from the spouse. The wife states that he was acting normally this morning but he got up this afternoon and said he needed to go to the hospital. He was complaining of pain in his back and his chest. He also the time was complaining of some left arm numbness. He has some chronic back pain issues per his wife and that it is not unusual. However he was not responding normally. He normally is alert and oriented per wife. He has not had any recent injuries. No known fevers. No cough or cold symptoms. No vomiting or diarrhea.     At presentation to ED patient found hypertensive otherwise hemodynamically stable and after required blood pressure has been improved.  Flat troponin 25 and 26 which is at baseline. EKG showing normal sinus rhythm, and nonspecific T abnormality in lateral leads.  EKG findings are similar as compared to previous EKG from June 2025. Lab, CBC unremarkable.  CMP showing creatinine 1.89 and 38 renal function at baseline otherwise unremarkable.  Normal lactic acid level.  Normal blood alcohol level and UDS.  CT head no acute intracranial abnormality.  EDP Dr. Lenor reported that patient is still seems confused and wife stating he is not his baseline yet.  Does not have any focal neurological deficit yet however it is hard to define if he is still having left-sided numbness are not in the setting of ongoing confusion.  Patient is alert oriented but not answering questions appropriately/unreliable. Patient is complaining about chronic back pain.  Denies any chest pain.  Of note patient has similar presentation episodes in June 2024 and extensive workup that  time ruled out stroke and seizure.  Given patient mental status has not normalized yet EDP has been reached out to for further evaluation for need for MRI of the brain to rule out a stroke and EEG to rule out seizure. Hospitalist has been consulted for further evaluation and management of altered mental status in terms of confusion.  Need to rule out CVA vs stroke.  Tiana Sivertson, MD [facility] 03/26/2024, 11:01 PM

## 2024-03-26 NOTE — ED Triage Notes (Addendum)
 Pt brought in by spouse.  States he was having staring spells and c/o left arm numbness PTA Last known well 1815 Pt not following commands  CBG 85 Spouse works from home States he is not normally like this After moments in triage he does states his back and chest hurts

## 2024-03-26 NOTE — ED Provider Notes (Signed)
 [facility] Provider Note   CSN: [id number] Arrival date & time: 03/26/24  8060     Patient presents with: Altered Mental Status   Aaron Marquez is a 68 y.o. male.   Patient is a 68 year old male who presents with altered mental status.  He is brought in by his spouse.  History is obtained from the spouse.  The wife states that he was acting normally this morning but he got up this afternoon and said he needed to go to the hospital.  He was complaining of pain in his back and his chest.  He also the time was complaining of some left arm numbness.  He has some chronic back pain issues per his wife and that it is not unusual.  However he was not responding normally.  He normally is alert and oriented per wife.  He has not had any recent injuries.  No known fevers.  No cough or cold symptoms.  No vomiting or diarrhea.  He has a history of hypertension, gout, GERD, chronic kidney disease, Parkinson's.  Patient's wife states that he had a similar episode in 2024  Per chart review, he was seen at [facility] in 2024 for similar episode of confusion and difficulty following commands.  It was felt to be ultimately press syndrome versus a seizure.  No evidence of stroke was noted on MRI.       Prior to Admission medications   Medication Sig Start Date End Date Taking? Authorizing Provider  acetaminophen  (TYLENOL ) 325 MG tablet Take 2 tablets (650 mg total) by mouth every 6 (six) hours as needed for mild pain (pain score 1-3) (or Fever >/= 101). 01/18/24   Dorinda Drue DASEN, MD  allopurinol  (ZYLOPRIM ) 100 MG tablet Take 100 mg by mouth daily.    [provider]  amLODipine  (NORVASC ) 10 MG tablet Take 10 mg by mouth daily. 12/21/23   [provider]  aspirin  81 MG tablet Take 81 mg by mouth daily.    [provider]  atorvastatin  (LIPITOR) 80 MG tablet Take 80 mg by mouth daily.    [provider]   carbidopa -levodopa  (SINEMET  IR) 25-100 MG tablet Take 1 tablet by mouth 3 (three) times daily. Patient taking differently: Take 1 tablet by mouth 2 (two) times daily. 06/21/23   Dohmeier, Dedra, MD  HYDROcodone -acetaminophen  (NORCO/VICODIN) 5-325 MG tablet Take 1-2 tablets by mouth every 6 (six) hours as needed. Patient not taking: Reported on 01/18/2024 01/10/23   Armenta Canning, MD  methocarbamol  (ROBAXIN ) 500 MG tablet Take 1 tablet (500 mg total) by mouth every 8 (eight) hours as needed. 02/04/24   Long, Joshua G, MD  metoprolol  succinate (TOPROL -XL) 50 MG 24 hr tablet Take 50 mg by mouth daily. Take with or immediately following a meal.    [provider]  pantoprazole  (PROTONIX ) 40 MG tablet Take 40 mg by mouth daily. 12/26/23   [provider]  telmisartan (MICARDIS) 20 MG tablet Take 20 mg by mouth daily. 06/26/23   [provider]    Allergies: Lisinopril  and Nsaids    Review of Systems  Unable to perform ROS: Mental status change    Updated Vital Signs BP (!) 155/84   Pulse 76   Temp 99 F (37.2 C) (Rectal)   Resp 13   Ht 6' 1 (1.854 m)   Wt 120.2 kg   SpO2 98%   BMI 34.96 kg/m   Physical Exam Constitutional:  Comments: Patient is awake but stares off into space and intermittently will focus on me  HENT:     Head: Normocephalic and atraumatic.     Mouth/Throat:     Mouth: Mucous membranes are moist.  Eyes:     Extraocular Movements: Extraocular movements intact.     Conjunctiva/sclera: Conjunctivae normal.     Pupils: Pupils are equal, round, and reactive to light.  Neck:     Comments: No meningismus Cardiovascular:     Rate and Rhythm: Normal rate.     Heart sounds: No murmur heard. Pulmonary:     Effort: Pulmonary effort is normal.     Breath sounds: Normal breath sounds.  Abdominal:     Palpations: Abdomen is soft.     Tenderness: There is no abdominal tenderness.  Musculoskeletal:        General: No swelling.      Cervical back: Normal range of motion and neck supple.  Skin:    General: Skin is warm and dry.  Neurological:     Comments: Patient is awake with eyes open but will only intermittently focus on me and answer questions.  When he does answer questions, he is clear.  I ask him if anything is bothering him and he replies his chest and his back.  But then he will not answer other questions.  He does not seem to have any aphasia or slurred speech.  He is very slow at following commands and does not follow commands consistently but he seems to have equal movement of both sides.  No facial drooping     (all labs ordered are listed, but only abnormal results are displayed) Labs Reviewed  COMPREHENSIVE METABOLIC PANEL WITH GFR - Abnormal; Notable for the following components:      Result Value   Creatinine, Ser 1.89 (*)    GFR, Estimated 38 (*)    All other components within normal limits  CBC WITH DIFFERENTIAL/PLATELET - Abnormal; Notable for the following components:   RBC 6.28 (*)    MCV 78.8 (*)    All other components within normal limits  URINALYSIS, W/ REFLEX TO CULTURE (INFECTION SUSPECTED) - Abnormal; Notable for the following components:   Bacteria, UA RARE (*)    All other components within normal limits  TROPONIN T, HIGH SENSITIVITY - Abnormal; Notable for the following components:   Troponin T High Sensitivity 25 (*)    All other components within normal limits  TROPONIN T, HIGH SENSITIVITY - Abnormal; Notable for the following components:   Troponin T High Sensitivity 26 (*)    All other components within normal limits  LACTIC ACID, PLASMA  ETHANOL  URINE DRUG SCREEN  TSH  CBG MONITORING, ED    EKG: None  Radiology: CT Head Wo Contrast Result Date: 03/26/2024 CLINICAL DATA:  Mental status change, unknown cause EXAM: CT HEAD WITHOUT CONTRAST TECHNIQUE: Contiguous axial images were obtained from the base of the skull through the vertex without intravenous contrast.  RADIATION DOSE REDUCTION: This exam was performed according to the departmental dose-optimization program which includes automated exposure control, adjustment of the mA and/or kV according to patient size and/or use of iterative reconstruction technique. COMPARISON:  04/30/2023 FINDINGS: Brain: No acute intracranial abnormality. Specifically, no hemorrhage, hydrocephalus, mass lesion, acute infarction, or significant intracranial injury. Vascular: No hyperdense vessel or unexpected calcification. Skull: No acute calvarial abnormality. Sinuses/Orbits: No acute findings Other: None IMPRESSION: No acute intracranial abnormality. Electronically Signed   By: Franky Crease M.D.  On: 03/26/2024 20:22     Procedures   Medications Ordered in the ED - No data to display                                  Medical Decision Making Amount and/or Complexity of Data Reviewed Labs: ordered. Radiology: ordered.   This patient presents to the ED for concern of altered mental status, this involves an extensive number of treatment options, and is a complaint that carries with it a high risk of complications and morbidity.  I considered the following differential and admission for this acute, potentially life threatening condition.  The differential diagnosis includes seizure, sepsis, stroke, intracranial hemorrhage, press syndrome, intoxication, medication reaction  MDM:    Patient is a 68 year old who presents with altered mental status.  He does not have any evident aphasia or focal neurologic deficits so code stroke was not activated.  He almost appears to be postictal.  Question whether he may have had a seizure.  Head CT does not show any acute abnormality.  No evidence of intracranial hemorrhage.  Rectal temp was performed which was normal.  No other concerns for infection.  His white count is normal.  His lactic acid is normal.  Other blood work is nonconcerning.  Urine drug screen is negative.  EtOH level is  negative.  His blood pressure is mildly elevated but not concerningly high.  His blood pressure currently is 155/84.  His mental status seems to be improving.  Will plan admission for observation and possible MRI.  Discussed with Dr. Sundil who will admit the patient.  (Labs, imaging, consults)  Labs: I Ordered, and personally interpreted labs.  The pertinent results include: Normal white count, normal lactic acid, mildly elevated creatinine but similar to prior values, mildly elevated troponins but flat and similar to prior values  Imaging Studies ordered: I ordered imaging studies including head CT I independently visualized and interpreted imaging. I agree with the radiologist interpretation  Additional history obtained from spouse.  External records from outside source obtained and reviewed including history  Cardiac Monitoring: The patient was maintained on a cardiac monitor.  If on the cardiac monitor, I personally viewed and interpreted the cardiac monitored which showed an underlying rhythm of: Sinus rhythm  Reevaluation: After the interventions noted above, I reevaluated the patient and found that they have :improved  Social Determinants of Health:    Disposition: Admit to hospital  Co morbidities that complicate the patient evaluation  Past Medical History:  Diagnosis Date   Back pain    GERD (gastroesophageal reflux disease)    Gout    Hypertension    Lumbar hernia    Pneumonia 07/31/12     Medicines No orders of the defined types were placed in this encounter.   I have reviewed the patients home medicines and have made adjustments as needed  Problem List / ED Course: Problem List Items Addressed This Visit   None Visit Diagnoses       Encephalopathy, unspecified type    -  Primary                Final diagnoses:  Encephalopathy, unspecified type    ED Discharge Orders     None          Lenor Hollering, MD 03/26/24 2305

## 2024-03-27 ENCOUNTER — Other Ambulatory Visit: Payer: Self-pay

## 2024-03-27 ENCOUNTER — Inpatient Hospital Stay (HOSPITAL_COMMUNITY)
Admit: 2024-03-27 | Discharge: 2024-03-27 | Disposition: A | Discharge status: Home / Self Care | Attending: Internal Medicine | Admitting: Internal Medicine

## 2024-03-27 ENCOUNTER — Inpatient Hospital Stay (HOSPITAL_COMMUNITY)

## 2024-03-27 ENCOUNTER — Encounter (HOSPITAL_COMMUNITY): Payer: Self-pay | Admitting: Internal Medicine

## 2024-03-27 DIAGNOSIS — R4182 Altered mental status, unspecified: Secondary | ICD-10-CM | POA: Diagnosis present

## 2024-03-27 DIAGNOSIS — I10 Essential (primary) hypertension: Secondary | ICD-10-CM | POA: Diagnosis not present

## 2024-03-27 DIAGNOSIS — Z8739 Personal history of other diseases of the musculoskeletal system and connective tissue: Secondary | ICD-10-CM

## 2024-03-27 DIAGNOSIS — Z7982 Long term (current) use of aspirin: Secondary | ICD-10-CM | POA: Diagnosis not present

## 2024-03-27 DIAGNOSIS — G252 Other specified forms of tremor: Secondary | ICD-10-CM

## 2024-03-27 DIAGNOSIS — M4804 Spinal stenosis, thoracic region: Secondary | ICD-10-CM | POA: Diagnosis present

## 2024-03-27 DIAGNOSIS — I129 Hypertensive chronic kidney disease with stage 1 through stage 4 chronic kidney disease, or unspecified chronic kidney disease: Secondary | ICD-10-CM | POA: Diagnosis present

## 2024-03-27 DIAGNOSIS — R569 Unspecified convulsions: Secondary | ICD-10-CM

## 2024-03-27 DIAGNOSIS — M549 Dorsalgia, unspecified: Secondary | ICD-10-CM | POA: Diagnosis present

## 2024-03-27 DIAGNOSIS — E66811 Obesity, class 1: Secondary | ICD-10-CM | POA: Diagnosis present

## 2024-03-27 DIAGNOSIS — G8929 Other chronic pain: Secondary | ICD-10-CM | POA: Diagnosis present

## 2024-03-27 DIAGNOSIS — K219 Gastro-esophageal reflux disease without esophagitis: Secondary | ICD-10-CM | POA: Diagnosis present

## 2024-03-27 DIAGNOSIS — E785 Hyperlipidemia, unspecified: Secondary | ICD-10-CM | POA: Diagnosis present

## 2024-03-27 DIAGNOSIS — M546 Pain in thoracic spine: Secondary | ICD-10-CM | POA: Diagnosis not present

## 2024-03-27 DIAGNOSIS — Z9889 Other specified postprocedural states: Secondary | ICD-10-CM | POA: Diagnosis not present

## 2024-03-27 DIAGNOSIS — Z79899 Other long term (current) drug therapy: Secondary | ICD-10-CM | POA: Diagnosis not present

## 2024-03-27 DIAGNOSIS — M109 Gout, unspecified: Secondary | ICD-10-CM | POA: Diagnosis present

## 2024-03-27 DIAGNOSIS — G20A1 Parkinson's disease without dyskinesia, without mention of fluctuations: Secondary | ICD-10-CM | POA: Diagnosis present

## 2024-03-27 DIAGNOSIS — Z6834 Body mass index (BMI) 34.0-34.9, adult: Secondary | ICD-10-CM | POA: Diagnosis not present

## 2024-03-27 DIAGNOSIS — E782 Mixed hyperlipidemia: Secondary | ICD-10-CM

## 2024-03-27 DIAGNOSIS — Z888 Allergy status to other drugs, medicaments and biological substances status: Secondary | ICD-10-CM | POA: Diagnosis not present

## 2024-03-27 DIAGNOSIS — N1832 Chronic kidney disease, stage 3b: Secondary | ICD-10-CM | POA: Diagnosis present

## 2024-03-27 DIAGNOSIS — R7989 Other specified abnormal findings of blood chemistry: Secondary | ICD-10-CM | POA: Diagnosis present

## 2024-03-27 DIAGNOSIS — D696 Thrombocytopenia, unspecified: Secondary | ICD-10-CM | POA: Diagnosis present

## 2024-03-27 DIAGNOSIS — Z8701 Personal history of pneumonia (recurrent): Secondary | ICD-10-CM | POA: Diagnosis not present

## 2024-03-27 DIAGNOSIS — Z886 Allergy status to analgesic agent status: Secondary | ICD-10-CM | POA: Diagnosis not present

## 2024-03-27 DIAGNOSIS — M5104 Intervertebral disc disorders with myelopathy, thoracic region: Secondary | ICD-10-CM | POA: Diagnosis present

## 2024-03-27 LAB — CK: Total CK: 248 U/L (ref 49–397)

## 2024-03-27 LAB — CBC WITH DIFFERENTIAL/PLATELET
Abs Immature Granulocytes: 0 K/uL (ref 0.00–0.07)
Basophils Absolute: 0 K/uL (ref 0.0–0.1)
Basophils Relative: 1 %
Eosinophils Absolute: 0.1 K/uL (ref 0.0–0.5)
Eosinophils Relative: 2 %
HCT: 45.4 % (ref 39.0–52.0)
Hemoglobin: 15.3 g/dL (ref 13.0–17.0)
Immature Granulocytes: 0 %
Lymphocytes Relative: 36 %
Lymphs Abs: 1.3 K/uL (ref 0.7–4.0)
MCH: 26.4 pg (ref 26.0–34.0)
MCHC: 33.7 g/dL (ref 30.0–36.0)
MCV: 78.3 fL — ABNORMAL LOW (ref 80.0–100.0)
Monocytes Absolute: 0.4 K/uL (ref 0.1–1.0)
Monocytes Relative: 11 %
Neutro Abs: 1.9 K/uL (ref 1.7–7.7)
Neutrophils Relative %: 50 %
Platelets: 141 K/uL — ABNORMAL LOW (ref 150–400)
RBC: 5.8 MIL/uL (ref 4.22–5.81)
RDW: 13.2 % (ref 11.5–15.5)
WBC: 3.7 K/uL — ABNORMAL LOW (ref 4.0–10.5)
nRBC: 0 % (ref 0.0–0.2)

## 2024-03-27 LAB — BASIC METABOLIC PANEL WITH GFR
Anion gap: 12 (ref 5–15)
BUN: 19 mg/dL (ref 8–23)
CO2: 23 mmol/L (ref 22–32)
Calcium: 8.5 mg/dL — ABNORMAL LOW (ref 8.9–10.3)
Chloride: 105 mmol/L (ref 98–111)
Creatinine, Ser: 1.73 mg/dL — ABNORMAL HIGH (ref 0.61–1.24)
GFR, Estimated: 43 mL/min — ABNORMAL LOW (ref >60)
Glucose, Bld: 98 mg/dL (ref 70–99)
Potassium: 3.7 mmol/L (ref 3.5–5.1)
Sodium: 140 mmol/L (ref 135–145)

## 2024-03-27 LAB — TSH: TSH: 2.52 u[IU]/mL (ref 0.350–4.500)

## 2024-03-27 LAB — HEPATIC FUNCTION PANEL
ALT: 7 U/L (ref 0–44)
AST: 15 U/L (ref 15–41)
Albumin: 3.2 g/dL — ABNORMAL LOW (ref 3.5–5.0)
Alkaline Phosphatase: 60 U/L (ref 38–126)
Bilirubin, Direct: 0.1 mg/dL (ref 0.0–0.2)
Indirect Bilirubin: 1 mg/dL — ABNORMAL HIGH (ref 0.3–0.9)
Total Bilirubin: 1.1 mg/dL (ref 0.0–1.2)
Total Protein: 6 g/dL — ABNORMAL LOW (ref 6.5–8.1)

## 2024-03-27 LAB — HIV ANTIBODY (ROUTINE TESTING W REFLEX): HIV Screen 4th Generation wRfx: NONREACTIVE

## 2024-03-27 MED ORDER — AMLODIPINE BESYLATE 10 MG PO TABS
10.0000 mg | ORAL_TABLET | Freq: Every day | ORAL | Status: DC
  Administered 2024-03-27 – 2024-04-02 (×7): 10 mg via ORAL
  Filled 2024-03-27 (×7): qty 1

## 2024-03-27 MED ORDER — ACETAMINOPHEN 325 MG PO TABS
650.0000 mg | ORAL_TABLET | Freq: Four times a day (QID) | ORAL | Status: DC | PRN
  Administered 2024-03-29 – 2024-03-31 (×3): 650 mg via ORAL
  Filled 2024-03-27 (×3): qty 2

## 2024-03-27 MED ORDER — METOPROLOL SUCCINATE ER 50 MG PO TB24
50.0000 mg | ORAL_TABLET | Freq: Every day | ORAL | Status: AC
Start: 2024-03-27 — End: ?
  Administered 2024-03-27 – 2024-04-02 (×7): 50 mg via ORAL
  Filled 2024-03-27 (×7): qty 1

## 2024-03-27 MED ORDER — LIDOCAINE 5 % EX PTCH
1.0000 | MEDICATED_PATCH | CUTANEOUS | Status: DC
  Administered 2024-03-27 – 2024-04-02 (×6): 1 via TRANSDERMAL
  Filled 2024-03-27 (×6): qty 1

## 2024-03-27 MED ORDER — ASPIRIN 81 MG PO TBEC
81.0000 mg | DELAYED_RELEASE_TABLET | Freq: Every day | ORAL | Status: DC
  Administered 2024-03-27: 81 mg via ORAL
  Filled 2024-03-27: qty 1

## 2024-03-27 MED ORDER — HYDROCODONE-ACETAMINOPHEN 5-325 MG PO TABS
1.0000 | ORAL_TABLET | Freq: Four times a day (QID) | ORAL | Status: DC | PRN
  Administered 2024-03-27 – 2024-04-02 (×12): 1 via ORAL
  Filled 2024-03-27 (×13): qty 1

## 2024-03-27 MED ORDER — DEXAMETHASONE SODIUM PHOSPHATE 4 MG/ML IJ SOLN
4.0000 mg | Freq: Four times a day (QID) | INTRAMUSCULAR | Status: DC
  Filled 2024-03-27: qty 1

## 2024-03-27 MED ORDER — ALLOPURINOL 100 MG PO TABS
100.0000 mg | ORAL_TABLET | Freq: Every day | ORAL | Status: DC
Start: 2024-03-27 — End: 2024-04-03
  Administered 2024-03-27 – 2024-04-02 (×7): 100 mg via ORAL
  Filled 2024-03-27 (×7): qty 1

## 2024-03-27 MED ORDER — DEXAMETHASONE SODIUM PHOSPHATE 4 MG/ML IJ SOLN
4.0000 mg | Freq: Four times a day (QID) | INTRAMUSCULAR | Status: DC
  Administered 2024-03-27 – 2024-03-31 (×16): 4 mg via INTRAVENOUS
  Filled 2024-03-27 (×20): qty 1

## 2024-03-27 MED ORDER — MORPHINE SULFATE (PF) 2 MG/ML IV SOLN
1.0000 mg | INTRAVENOUS | Status: DC | PRN
  Administered 2024-03-27 – 2024-04-02 (×6): 2 mg via INTRAVENOUS
  Filled 2024-03-27 (×6): qty 1

## 2024-03-27 MED ORDER — PANTOPRAZOLE SODIUM 40 MG PO TBEC
40.0000 mg | DELAYED_RELEASE_TABLET | Freq: Every day | ORAL | Status: DC
  Administered 2024-03-27 – 2024-04-02 (×7): 40 mg via ORAL
  Filled 2024-03-27 (×7): qty 1

## 2024-03-27 MED ORDER — IRBESARTAN 75 MG PO TABS
75.0000 mg | ORAL_TABLET | Freq: Every day | ORAL | Status: DC
  Administered 2024-03-27 – 2024-04-02 (×7): 75 mg via ORAL
  Filled 2024-03-27 (×7): qty 1

## 2024-03-27 MED ORDER — ATORVASTATIN CALCIUM 80 MG PO TABS
80.0000 mg | ORAL_TABLET | Freq: Every day | ORAL | Status: DC
  Administered 2024-03-27 – 2024-04-02 (×7): 80 mg via ORAL
  Filled 2024-03-27 (×7): qty 1

## 2024-03-27 MED ORDER — HYDROCODONE-ACETAMINOPHEN 5-325 MG PO TABS: 1.0000 | ORAL_TABLET | Freq: Four times a day (QID) | ORAL | Status: DC | PRN

## 2024-03-27 MED ORDER — ACETAMINOPHEN 500 MG PO TABS
1000.0000 mg | ORAL_TABLET | Freq: Once | ORAL | Status: AC
  Administered 2024-03-27: 1000 mg via ORAL
  Filled 2024-03-27: qty 2

## 2024-03-27 NOTE — Procedures (Signed)
 Patient Name: Aaron Marquez  MRN: [id number]  Epilepsy Attending: Arlin MALVA Krebs  Referring Physician/Provider: Franky Redia SAILOR, MD  Date: 03/27/2024 Duration: 29.21 mins  Patient history: 68yo M with ams. EEG to evaluate for seizure  Level of alertness: Awake  AEDs during EEG study: None  Technical aspects: This EEG study was done with scalp electrodes positioned according to the 10-20 International system of electrode placement. Electrical activity was reviewed with band pass filter of 1-70Hz , sensitivity of 7 uV/mm, display speed of 29mm/sec with a 60Hz  notched filter applied as appropriate. EEG data were recorded continuously and digitally stored.  Video monitoring was available and reviewed as appropriate.  Description: The posterior dominant rhythm consists of 7.5Hz  activity of moderate voltage (25-35 uV) seen predominantly in posterior head regions, symmetric and reactive to eye opening and eye closing. Hyperventilation and photic stimulation were not performed.     IMPRESSION: This study is within normal limits. No seizures or epileptiform discharges were seen throughout the recording.  A normal interictal EEG does not exclude the diagnosis of epilepsy.   Aaron Marquez

## 2024-03-27 NOTE — Progress Notes (Signed)
 Pt is c/o pain along his spine and a headache at 8/10. RN messaged MD on call to make aware and to see if something else can be given.   Aaron Marquez Jaqlyn Gruenhagen

## 2024-03-27 NOTE — Progress Notes (Signed)
 New Admission Note:  Arrival Method: From [facility] Mental Orientation: Self Telemetry: 1, CCMD notified Assessment: Completed Skin: Completed, refer to flowsheets IV: L AC S.L. Pain: 8/10 neck and back pain. MD notified Tubes: None Safety Measures: Safety Fall Prevention Plan was given, discussed and signed. Admission: Completed [facility] Orientation: Patient has been orientated to the room, unit and the staff. Family: None  Orders have been reviewed and implemented. Will continue to monitor the patient. Call light has been placed within reach and bed alarm has been activated.   Bari Lor, RN  Phone Number: [phone]

## 2024-03-27 NOTE — Plan of Care (Signed)
  Problem: Health Behavior/Discharge Planning: Goal: Ability to manage health-related needs will improve Outcome: Progressing   Problem: Activity: Goal: Risk for activity intolerance will decrease Outcome: Progressing   Problem: Nutrition: Goal: Adequate nutrition will be maintained Outcome: Progressing   Problem: Pain Managment: Goal: General experience of comfort will improve and/or be controlled Outcome: Progressing

## 2024-03-27 NOTE — Progress Notes (Signed)
 Pt still in MRI.  Bari HERO Aaron Marquez

## 2024-03-27 NOTE — H&P (Signed)
 History and Physical    Aaron Marquez DOB: 1955-11-06 DOA: 03/26/2024  Patient coming from: Home.  Chief Complaint: Upper back pain confusion.  HPI: Aaron Marquez is a 68 y.o. male with history of hypertension, gout, back pain who had epidural injection in the low back about 2 weeks ago was brought to the ER after patient was found to be confused with increasing pain in the upper back and also complained of some left arm pain.  Symptoms been present since evening yesterday.  Denies any abdominal pain nausea vomiting or diarrhea.  ED Course: In the ER labs showed creatinine 1.8 troponins were 25 and 26 WBC was 4.6 patient was afebrile.  UA unremarkable.  Drug screen was negative.  CT head did not show anything acute.  EKG shows normal sinus rhythm.  Patient admitted for follow-up.  On my exam patient has significant tenderness in the upper back and complains of significant pain.  Review of Systems: As per HPI, rest all negative.   Past Medical History:  Diagnosis Date   Back pain    GERD (gastroesophageal reflux disease)    Gout    Hypertension    Lumbar hernia    Pneumonia 07/31/12    Past Surgical History:  Procedure Laterality Date   BACK SURGERY     FERTILITY SURGERY     stent   LUMBAR LAMINECTOMY/DECOMPRESSION MICRODISCECTOMY Right 01/01/2013   Procedure: LUMBAR LAMINECTOMY/DECOMPRESSION MICRODISCECTOMY 1 LEVEL;  Surgeon: Arley SHAUNNA Helling, MD;  Location: [facility];  Service: Neurosurgery;  Laterality: Right;  Lumbar Laminectomies Lumbar Four-Five, Decompression, Discectomy      reports that he has never smoked. He has never used smokeless tobacco. He reports that he does not drink alcohol and does not use drugs.  Allergies  Allergen Reactions   Lisinopril  Nausea Only and Other (See Comments)    Makes me gag  Makes me Gag  Tingling in Throat  Tingling in throat  Makes me Gag, Tingling in Throat  Tingling in throat    Makes me Gag Tingling in  Throat    Makes me gag Makes me Gag Tingling in Throat Tingling in throat    Makes me Gag, Tingling in Throat   Nsaids Other (See Comments)    History of CKD limits potential use    History reviewed. No pertinent family history.  Prior to Admission medications   Medication Sig Start Date End Date Taking? Authorizing Provider  acetaminophen  (TYLENOL ) 325 MG tablet Take 2 tablets (650 mg total) by mouth every 6 (six) hours as needed for mild pain (pain score 1-3) (or Fever >/= 101). 01/18/24   Dorinda Drue DASEN, MD  allopurinol  (ZYLOPRIM ) 100 MG tablet Take 100 mg by mouth daily.    [provider]  amLODipine  (NORVASC ) 10 MG tablet Take 10 mg by mouth daily. 12/21/23   [provider]  aspirin  81 MG tablet Take 81 mg by mouth daily.    [provider]  atorvastatin  (LIPITOR) 80 MG tablet Take 80 mg by mouth daily.    [provider]  carbidopa -levodopa  (SINEMET  IR) 25-100 MG tablet Take 1 tablet by mouth 3 (three) times daily. Patient taking differently: Take 1 tablet by mouth 2 (two) times daily. 06/21/23   Dohmeier, Dedra, MD  HYDROcodone -acetaminophen  (NORCO/VICODIN) 5-325 MG tablet Take 1-2 tablets by mouth every 6 (six) hours as needed. Patient not taking: Reported on 01/18/2024 01/10/23   Armenta Canning, MD  methocarbamol  (ROBAXIN ) 500 MG tablet Take 1 tablet (500  mg total) by mouth every 8 (eight) hours as needed. 02/04/24   Long, Joshua G, MD  metoprolol  succinate (TOPROL -XL) 50 MG 24 hr tablet Take 50 mg by mouth daily. Take with or immediately following a meal.    [provider]  pantoprazole  (PROTONIX ) 40 MG tablet Take 40 mg by mouth daily. 12/26/23   [provider]  telmisartan (MICARDIS) 20 MG tablet Take 20 mg by mouth daily. 06/26/23   [provider]    Physical Exam: Constitutional: Moderately built and nourished. Vitals:   03/26/24 2315 03/27/24 0045 03/27/24 0100 03/27/24 0148  BP: (!) 143/85  (!) 131/59  (!) 187/100  Pulse:  64 62 70  Resp: 17 16  20   Temp:    98.4 F (36.9 C)  TempSrc:    Oral  SpO2:  98% 96% 95%  Weight:      Height:       Eyes: Anicteric no pallor. ENMT: No discharge from the ears eyes nose or mouth. Neck: No mass felt.  No neck rigidity. Respiratory: No rhonchi or crepitations. Cardiovascular: S1-S2 heard. Abdomen: Soft nontender bowel sound present. Musculoskeletal: No edema.  Pain on palpation of upper back. Skin: No rash. Neurologic: Awake oriented to time place and person.  Moving all extremities.  Generally weak. Psychiatric: Oriented to time place and person.   Labs on Admission: I have personally reviewed following labs and imaging studies  CBC: Recent Labs  Lab 03/26/24 1954  WBC 4.6  NEUTROABS 2.3  HGB 16.6  HCT 49.5  MCV 78.8*  PLT 169   Basic Metabolic Panel: Recent Labs  Lab 03/26/24 1954  NA 139  K 4.2  CL 104  CO2 23  GLUCOSE 98  BUN 21  CREATININE 1.89*  CALCIUM  9.0   GFR: Estimated Creatinine Clearance: 51.5 mL/min (A) (by C-G formula based on SCr of 1.89 mg/dL (H)). Liver Function Tests: Recent Labs  Lab 03/26/24 1954  AST 20  ALT 17  ALKPHOS 77  BILITOT 0.6  PROT 6.7  ALBUMIN 4.1   No results for input(s): LIPASE, AMYLASE in the last 168 hours. No results for input(s): AMMONIA in the last 168 hours. Coagulation Profile: No results for input(s): INR, PROTIME in the last 168 hours. Cardiac Enzymes: No results for input(s): CKTOTAL, CKMB, CKMBINDEX, TROPONINI in the last 168 hours. BNP (last 3 results) No results for input(s): PROBNP in the last 8760 hours. HbA1C: No results for input(s): HGBA1C in the last 72 hours. CBG: Recent Labs  Lab 03/26/24 1944  GLUCAP 85   Lipid Profile: No results for input(s): CHOL, HDL, LDLCALC, TRIG, CHOLHDL, LDLDIRECT in the last 72 hours. Thyroid Function Tests: No results for input(s): TSH, T4TOTAL, FREET4, T3FREE, THYROIDAB  in the last 72 hours. Anemia Panel: No results for input(s): VITAMINB12, FOLATE, FERRITIN, TIBC, IRON, RETICCTPCT in the last 72 hours. Urine analysis:    Component Value Date/Time   COLORURINE YELLOW 03/26/2024 2000   APPEARANCEUR CLEAR 03/26/2024 2000   LABSPEC 1.025 03/26/2024 2000   PHURINE 5.5 03/26/2024 2000   GLUCOSEU NEGATIVE 03/26/2024 2000   HGBUR NEGATIVE 03/26/2024 2000   BILIRUBINUR NEGATIVE 03/26/2024 2000   KETONESUR NEGATIVE 03/26/2024 2000   PROTEINUR NEGATIVE 03/26/2024 2000   UROBILINOGEN 1.0 06/12/2015 1714   NITRITE NEGATIVE 03/26/2024 2000   LEUKOCYTESUR NEGATIVE 03/26/2024 2000   Sepsis Labs: @LABRCNTIP (procalcitonin:4,lacticidven:4) )No results found for this or any previous visit (from the past 240 hours).   Radiological Exams on Admission: CT Head Wo Contrast  Result Date: 03/26/2024 CLINICAL DATA:  Mental status change, unknown cause EXAM: CT HEAD WITHOUT CONTRAST TECHNIQUE: Contiguous axial images were obtained from the base of the skull through the vertex without intravenous contrast. RADIATION DOSE REDUCTION: This exam was performed according to the departmental dose-optimization program which includes automated exposure control, adjustment of the mA and/or kV according to patient size and/or use of iterative reconstruction technique. COMPARISON:  04/30/2023 FINDINGS: Brain: No acute intracranial abnormality. Specifically, no hemorrhage, hydrocephalus, mass lesion, acute infarction, or significant intracranial injury. Vascular: No hyperdense vessel or unexpected calcification. Skull: No acute calvarial abnormality. Sinuses/Orbits: No acute findings Other: None IMPRESSION: No acute intracranial abnormality. Electronically Signed   By: Franky Crease M.D.   On: 03/26/2024 20:22    EKG: Independently reviewed.  Normal sinus rhythm.  Assessment/Plan Principal Problem:   Altered mental status Active Problems:   Resting tremor   HLD  (hyperlipidemia)   CKD stage 3b, GFR 30-44 ml/min (HCC)   Back pain   Upper back pain    Upper back pain with confusion -   on exam patient has significant tenderness on upper back.  Patient also complains of pain radiating to his left upper extremity.  Per wife patient was transiently confused.  Presently at the time of my exam well-oriented.  MRI of the C-spine and T-spine and brain has been ordered.  Also check EEG.  Check CK levels.  Patient has had recent low back epidural injection 2 weeks ago.  Patient is afebrile at this time. Hypertension will continue metoprolol  ARB and amlodipine .  Follow blood pressure trends. Elevated troponin but flat.  Recent 2D echo done in June 2024 showed EF of 65 to 70%. History of gout on allopurinol . Chronic kidney disease stage III creatinine at her baseline. Hyperlipidemia on statins. Tremors on Sinemet .  Since patient has significant upper back pain and transient confusion will need further workup and monitoring to the next day.   DVT prophylaxis: SCDs until MRI results are available. Code Status: Full code. Family Communication: Discussed with patient's wife. Disposition Plan: Medical floor. Consults called: None. Admission status: Observation.

## 2024-03-27 NOTE — Progress Notes (Addendum)
 [facility]                                                                               Aaron Marquez, is a 68 y.o. male, DOB - February 22, 1956, FMW:[id number] Admit date - 03/26/2024    Outpatient Primary MD for the patient is [name], FNP  LOS - 0  days    Brief summary    Aaron Marquez is a 68 y.o. male with history of hypertension, gout, back pain who had epidural injection in the low back about 2 weeks ago was brought to the ER after patient was found to be confused with increasing pain in the upper back and also complained of some left arm pain.  MRI T spine shows  Left posterolateral disc protrusion at T11-12 impressing upon the ventral surface of the spinal cord and causing moderate left-sided spinal canal stenosis.  Minimal posterior disc bulging at T3-4, T5-6, T6-7, T7-8, T8-9, T9-10, and T10-11 with very mild central spinal canal stenosis at each level, without impingement upon the spinal cord or nerve roots. MRI brain without contrast is negative for acute pathology.  Neurosurgery consulted.   Assessment & Plan    Assessment and Plan:   Upper back pain:  Abnormal MRI T spine. No bowel or bladder incontinence. Patient reports some tremors in the left arm attributed to Parkinson's .  Uncontrolled with oral pain  meds. Added IV morphine  1 to 2 mg in addition to hydrocodone  5 -325 mg every 6 hours.  Adding IV decadron .  NS consulted , will see the patient later today.    Hypertension Optimal.  Continue with amlodipine , metoprolol  and prn hydralazine .    Hyperlipidemia Resume statin.     Stage 3b CKD Creatinine at baseline.   Estimated body mass index is 34.96 kg/m as calculated from the following:   Height as of this encounter: 6' 1 (1.854 m).   Weight as of this encounter: 120.2 kg.  Code Status: full code.  DVT Prophylaxis:  SCDs Start: 03/27/24 0436   Level of Care: Level of care: Telemetry Medical Family Communication: none at  bedside. Called wife and spoke to her and updated her.   Disposition Plan:     Remains inpatient appropriate:  pending clinical improvement.   Procedures:  MRI T SPINE  Consultants:   Neurosurgery   Antimicrobials:   Anti-infectives (From admission, onward)    None        Medications  Scheduled Meds:  allopurinol   100 mg Oral Daily   amLODipine   10 mg Oral Daily   aspirin  EC  81 mg Oral Daily   atorvastatin   80 mg Oral Daily   carbidopa -levodopa   1 tablet Oral BID   dexamethasone  (DECADRON ) injection  4 mg Intravenous Q6H   irbesartan   75 mg Oral Daily   metoprolol  succinate  50 mg Oral Daily   pantoprazole   40 mg Oral Daily   Continuous Infusions: PRN Meds:.acetaminophen , hydrALAZINE , HYDROcodone -acetaminophen , morphine  injection    Subjective:   Aaron Marquez was seen and examined today.  Pain not well controlled.   Objective:   Vitals:   03/27/24 0100 03/27/24 0148 03/27/24 9388 03/27/24 9192  BP: (!) 131/59 (!) 187/100 (!) 151/90 (!) 137/90  Pulse: 62 70 70 64  Resp:  20  19  Temp:  98.4 F (36.9 C) 98.4 F (36.9 C) 98.4 F (36.9 C)  TempSrc:  Oral Oral   SpO2: 96% 95% 98% 97%  Weight:      Height:        Intake/Output Summary (Last 24 hours) at 03/27/2024 1338 Last data filed at 03/27/2024 0900 Gross per 24 hour  Intake 290 ml  Output 820 ml  Net -530 ml   Filed Weights   03/26/24 1946  Weight: 120.2 kg     Exam General exam: Appears calm and comfortable  Respiratory system: Clear to auscultation. Respiratory effort normal. Cardiovascular system: S1 & S2 heard, RRR. No JVD, Gastrointestinal system: Abdomen is nondistended, soft and nontender    Data Reviewed:  I have personally reviewed following labs and imaging studies   CBC Lab Results  Component Value Date   WBC 3.7 (L) 03/27/2024   RBC 5.80 03/27/2024   HGB 15.3 03/27/2024   HCT 45.4 03/27/2024   MCV 78.3 (L) 03/27/2024   MCH 26.4 03/27/2024   PLT 141 (L) 03/27/2024    MCHC 33.7 03/27/2024   RDW 13.2 03/27/2024   LYMPHSABS 1.3 03/27/2024   MONOABS 0.4 03/27/2024   EOSABS 0.1 03/27/2024   BASOSABS 0.0 03/27/2024     Last metabolic panel Lab Results  Component Value Date   NA 140 03/27/2024   K 3.7 03/27/2024   CL 105 03/27/2024   CO2 23 03/27/2024   BUN 19 03/27/2024   CREATININE 1.73 (H) 03/27/2024   GLUCOSE 98 03/27/2024   GFRNONAA 43 (L) 03/27/2024   GFRAA 45 (L) 06/01/2018   CALCIUM  8.5 (L) 03/27/2024   PROT 6.0 (L) 03/27/2024   ALBUMIN 3.2 (L) 03/27/2024   LABGLOB 2.9 06/21/2023   LABGLOB 2.3 06/21/2023   AGRATIO 1.3 06/21/2023   BILITOT 1.1 03/27/2024   ALKPHOS 60 03/27/2024   AST 15 03/27/2024   ALT 7 03/27/2024   ANIONGAP 12 03/27/2024    CBG (last 3)  Recent Labs    03/26/24 1944  GLUCAP 85      Coagulation Profile: No results for input(s): INR, PROTIME in the last 168 hours.   Radiology Studies: MR THORACIC SPINE WO CONTRAST Result Date: 03/27/2024 EXAM: MRI THORACIC SPINE WITHOUT INTRAVENOUS CONTRAST 03/27/2024 05:52:46 AM TECHNIQUE: Multiplanar multisequence MRI of the thoracic spine was performed without the administration of intravenous contrast. COMPARISON: None available. CLINICAL HISTORY: Mid-back pain, infection suspected, positive xray/CT. FINDINGS: BONES AND ALIGNMENT: Normal alignment. Normal vertebral body heights. Bone marrow signal is unremarkable. No abnormal enhancement. SPINAL CORD: Normal spinal cord volume. Normal spinal cord signal. SOFT TISSUES: Unremarkable. DEGENERATIVE CHANGES: There is a left posterolateral disc protrusion at T11-12, which is impressing upon the ventral surface of the spinal cord and causing moderate left-sided spinal canal stenosis. The protruded disc measures approximately 6 mm in AP dimension and 13 mm in transverse dimension and 19 mm in Craniocaudad length. There is minimal posterior disc bulging also present at T3-4, T5-6, T6-7, T7-8, T8-9, T9-10 and T10-11, with very  mild central spinal canal stenosis at each level, but no impingement upon the spinal cord or nerve roots. IMPRESSION: 1. Left posterolateral disc protrusion at T11-12 impressing upon the ventral surface of the spinal cord and causing moderate left-sided spinal canal stenosis. 2. Minimal posterior disc bulging at T3-4, T5-6, T6-7, T7-8, T8-9, T9-10, and T10-11 with very mild central  spinal canal stenosis at each level, without impingement upon the spinal cord or nerve roots. Electronically signed by: Evalene Coho MD 03/27/2024 06:22 AM EDT RP Workstation: [id number]   MR CERVICAL SPINE WO CONTRAST Result Date: 03/27/2024 EXAM: MRI CERVICAL SPINE WITHOUT CONTRAST 03/27/2024 05:52:29 AM TECHNIQUE: Multiplanar multisequence MRI of the cervical spine was performed. COMPARISON: Cervical spine series dated 11/17/2023. CLINICAL HISTORY: Myelopathy, acute, cervical spine. FINDINGS: BONES AND ALIGNMENT: Reversal of the normal cervical lordosis. Normal vertebral body heights. Bone marrow signal is unremarkable. SPINAL CORD: Normal spinal cord size. No abnormal spinal cord signal. SOFT TISSUES: No paraspinal mass. C2-C3: Disc space narrowing. No significant disc herniation. No spinal canal stenosis or neural foraminal narrowing. C3-C4: Disc space narrowing and endplate ridging causing mild central spinal canal stenosis and mild-to-moderate left neural foraminal stenosis. C4-C5: Diffuse endplate ridging causing mild-to-moderate central spinal canal stenosis and mild-to-moderate bilateral neural foraminal stenosis. C5-C6: Mild central spinal canal stenosis and moderate left neural foraminal stenosis. No significant disc herniation. C6-C7: Diffuse disc bulging and endplate ridging with mild central spinal canal stenosis and mild-to-moderate left neural foraminal stenosis. C7-T1: No significant disc herniation. No spinal canal stenosis or neural foraminal narrowing. IMPRESSION: 1. Reversal of the normal cervical lordosis. 2.  Disc space narrowing at C2-3 and C3-4. 3. Mild central spinal canal stenosis at C3-4, C5-6, and C6-7. 4. Mild-to-moderate left neural foraminal stenosis at C3-4 and C6-7. 5. Mild-to-moderate bilateral neural foraminal stenosis at C4-5. 6. Moderate left neural foraminal stenosis at C5-6. Electronically signed by: Evalene Coho MD 03/27/2024 06:15 AM EDT RP Workstation: [id number]   MR BRAIN WO CONTRAST Result Date: 03/27/2024 EXAM: MRI BRAIN WITHOUT CONTRAST 03/27/2024 05:52:03 AM TECHNIQUE: Multiplanar multisequence MRI of the head/brain was performed without the administration of intravenous contrast. COMPARISON: CT of the head dated 03/28/2024. CLINICAL HISTORY: Neuro deficit, acute, stroke suspected. FINDINGS: BRAIN AND VENTRICLES: No convincing evidence of restricted diffusion. No acute infarct. No intracranial hemorrhage. No mass. No midline shift. No hydrocephalus. The sella is unremarkable. Normal flow voids. ORBITS: The patient is status post bilateral lens replacement. No acute abnormality. SINUSES AND MASTOIDS: There is mild mucosal disease within the floor of the maxillary sinuses. No acute abnormality. BONES AND SOFT TISSUES: Normal marrow signal. No acute soft tissue abnormality. IMPRESSION: 1. No acute intracranial abnormality. 2. Mild mucosal disease within the floor of the maxillary sinuses. Electronically signed by: Evalene Coho MD 03/27/2024 06:12 AM EDT RP Workstation: [id number]   CT Head Wo Contrast Result Date: 03/26/2024 CLINICAL DATA:  Mental status change, unknown cause EXAM: CT HEAD WITHOUT CONTRAST TECHNIQUE: Contiguous axial images were obtained from the base of the skull through the vertex without intravenous contrast. RADIATION DOSE REDUCTION: This exam was performed according to the departmental dose-optimization program which includes automated exposure control, adjustment of the mA and/or kV according to patient size and/or use of iterative reconstruction technique.  COMPARISON:  04/30/2023 FINDINGS: Brain: No acute intracranial abnormality. Specifically, no hemorrhage, hydrocephalus, mass lesion, acute infarction, or significant intracranial injury. Vascular: No hyperdense vessel or unexpected calcification. Skull: No acute calvarial abnormality. Sinuses/Orbits: No acute findings Other: None IMPRESSION: No acute intracranial abnormality. Electronically Signed   By: Franky Crease M.D.   On: 03/26/2024 20:22       Elgie Butter M.D. [facility] 03/27/2024, 1:38 PM  Available via Epic secure chat 7am-7pm After 7 pm, please refer to night coverage provider listed on amion.

## 2024-03-27 NOTE — Consult Note (Signed)
 Reason for Consult:back pain  Referring Physician: edp  Aaron Marquez is an 68 y.o. male.   HPI:  68 year old male presents to the ED with severe mid back pain.  The pain was sudden this morning.  States that it felt like a sharp twisting sensation in his back.  Denies any pain in his legs.  However, he does endorse numbness and tingling in his legs.  Daughter states that he was cooking breakfast this morning and ambulating just fine.  Has a history of previous spine surgery  Past Medical History:  Diagnosis Date   Back pain    GERD (gastroesophageal reflux disease)    Gout    Hypertension    Lumbar hernia    Pneumonia 07/31/12    Past Surgical History:  Procedure Laterality Date   BACK SURGERY     FERTILITY SURGERY     stent   LUMBAR LAMINECTOMY/DECOMPRESSION MICRODISCECTOMY Right 01/01/2013   Procedure: LUMBAR LAMINECTOMY/DECOMPRESSION MICRODISCECTOMY 1 LEVEL;  Surgeon: Arley SHAUNNA Helling, MD;  Location: [facility];  Service: Neurosurgery;  Laterality: Right;  Lumbar Laminectomies Lumbar Four-Five, Decompression, Discectomy     Allergies  Allergen Reactions   Nsaids Other (See Comments)    History of CKD limits potential use   Zestril  [Lisinopril ] Nausea Only and Other (See Comments)    Gagging secondary to tingling in throat    Social History   Tobacco Use   Smoking status: Never   Smokeless tobacco: Never  Substance Use Topics   Alcohol use: No    History reviewed. No pertinent family history.   Review of Systems  Positive ROS: As above  All other systems have been reviewed and were otherwise negative with the exception of those mentioned in the HPI and as above.  Objective: Vital signs in last 24 hours: Temp:  [98.4 F (36.9 C)-99 F (37.2 C)] 98.4 F (36.9 C) (08/21 0807) Pulse Rate:  [62-76] 64 (08/21 0807) Resp:  [11-20] 19 (08/21 0807) BP: (131-187)/(59-116) 137/90 (08/21 0807) SpO2:  [95 %-99 %] 97 % (08/21 0807) Weight:  [120.2 kg] 120.2 kg (08/20  1946)  General Appearance: Alert, cooperative, no distress, appears stated age Head: Normocephalic, without obvious abnormality, atraumatic Eyes: PERRL, conjunctiva/corneas clear, EOM's intact, fundi benign, both eyes      Back: Symmetric, no curvature, ROM normal, no CVA tenderness Lungs:respirations unlabored Heart: Regular rate and rhythm Extremities: Extremities normal, atraumatic, no cyanosis or edema Pulses: 2+ and symmetric all extremities Skin: Skin color, texture, turgor normal, no rashes or lesions  NEUROLOGIC:   Mental status: A&O x4, no aphasia, good attention span, Memory and fund of knowledge Motor Exam - grossly normal, normal tone and bulk Sensory Exam - grossly normal Reflexes: symmetric, no pathologic reflexes, No Hoffman's, may have some slight clonus Coordination - grossly normal Gait -not tested Balance -not tested Cranial Nerves: I: smell Not tested  II: visual acuity  OS: na    OD: na  II: visual fields Full to confrontation  II: pupils Equal, round, reactive to light  III,VII: ptosis None  III,IV,VI: extraocular muscles  Full ROM  V: mastication   V: facial light touch sensation    V,VII: corneal reflex    VII: facial muscle function - upper    VII: facial muscle function - lower   VIII: hearing   IX: soft palate elevation    IX,X: gag reflex   XI: trapezius strength    XI: sternocleidomastoid strength   XI: neck flexion strength  XII: tongue strength      Data Review Lab Results  Component Value Date   WBC 3.7 (L) 03/27/2024   HGB 15.3 03/27/2024   HCT 45.4 03/27/2024   MCV 78.3 (L) 03/27/2024   PLT 141 (L) 03/27/2024   Lab Results  Component Value Date   NA 140 03/27/2024   K 3.7 03/27/2024   CL 105 03/27/2024   CO2 23 03/27/2024   BUN 19 03/27/2024   CREATININE 1.73 (H) 03/27/2024   GLUCOSE 98 03/27/2024   Lab Results  Component Value Date   INR 1.0 04/30/2023    Radiology: MR THORACIC SPINE WO CONTRAST Result Date:  03/27/2024 EXAM: MRI THORACIC SPINE WITHOUT INTRAVENOUS CONTRAST 03/27/2024 05:52:46 AM TECHNIQUE: Multiplanar multisequence MRI of the thoracic spine was performed without the administration of intravenous contrast. COMPARISON: None available. CLINICAL HISTORY: Mid-back pain, infection suspected, positive xray/CT. FINDINGS: BONES AND ALIGNMENT: Normal alignment. Normal vertebral body heights. Bone marrow signal is unremarkable. No abnormal enhancement. SPINAL CORD: Normal spinal cord volume. Normal spinal cord signal. SOFT TISSUES: Unremarkable. DEGENERATIVE CHANGES: There is a left posterolateral disc protrusion at T11-12, which is impressing upon the ventral surface of the spinal cord and causing moderate left-sided spinal canal stenosis. The protruded disc measures approximately 6 mm in AP dimension and 13 mm in transverse dimension and 19 mm in Craniocaudad length. There is minimal posterior disc bulging also present at T3-4, T5-6, T6-7, T7-8, T8-9, T9-10 and T10-11, with very mild central spinal canal stenosis at each level, but no impingement upon the spinal cord or nerve roots. IMPRESSION: 1. Left posterolateral disc protrusion at T11-12 impressing upon the ventral surface of the spinal cord and causing moderate left-sided spinal canal stenosis. 2. Minimal posterior disc bulging at T3-4, T5-6, T6-7, T7-8, T8-9, T9-10, and T10-11 with very mild central spinal canal stenosis at each level, without impingement upon the spinal cord or nerve roots. Electronically signed by: Evalene Coho MD 03/27/2024 06:22 AM EDT RP Workstation: [id number]   MR CERVICAL SPINE WO CONTRAST Result Date: 03/27/2024 EXAM: MRI CERVICAL SPINE WITHOUT CONTRAST 03/27/2024 05:52:29 AM TECHNIQUE: Multiplanar multisequence MRI of the cervical spine was performed. COMPARISON: Cervical spine series dated 11/17/2023. CLINICAL HISTORY: Myelopathy, acute, cervical spine. FINDINGS: BONES AND ALIGNMENT: Reversal of the normal cervical  lordosis. Normal vertebral body heights. Bone marrow signal is unremarkable. SPINAL CORD: Normal spinal cord size. No abnormal spinal cord signal. SOFT TISSUES: No paraspinal mass. C2-C3: Disc space narrowing. No significant disc herniation. No spinal canal stenosis or neural foraminal narrowing. C3-C4: Disc space narrowing and endplate ridging causing mild central spinal canal stenosis and mild-to-moderate left neural foraminal stenosis. C4-C5: Diffuse endplate ridging causing mild-to-moderate central spinal canal stenosis and mild-to-moderate bilateral neural foraminal stenosis. C5-C6: Mild central spinal canal stenosis and moderate left neural foraminal stenosis. No significant disc herniation. C6-C7: Diffuse disc bulging and endplate ridging with mild central spinal canal stenosis and mild-to-moderate left neural foraminal stenosis. C7-T1: No significant disc herniation. No spinal canal stenosis or neural foraminal narrowing. IMPRESSION: 1. Reversal of the normal cervical lordosis. 2. Disc space narrowing at C2-3 and C3-4. 3. Mild central spinal canal stenosis at C3-4, C5-6, and C6-7. 4. Mild-to-moderate left neural foraminal stenosis at C3-4 and C6-7. 5. Mild-to-moderate bilateral neural foraminal stenosis at C4-5. 6. Moderate left neural foraminal stenosis at C5-6. Electronically signed by: Evalene Coho MD 03/27/2024 06:15 AM EDT RP Workstation: [id number]   MR BRAIN WO CONTRAST Result Date: 03/27/2024 EXAM: MRI BRAIN WITHOUT CONTRAST 03/27/2024 05:52:03 AM TECHNIQUE:  Multiplanar multisequence MRI of the head/brain was performed without the administration of intravenous contrast. COMPARISON: CT of the head dated 03/28/2024. CLINICAL HISTORY: Neuro deficit, acute, stroke suspected. FINDINGS: BRAIN AND VENTRICLES: No convincing evidence of restricted diffusion. No acute infarct. No intracranial hemorrhage. No mass. No midline shift. No hydrocephalus. The sella is unremarkable. Normal flow voids. ORBITS:  The patient is status post bilateral lens replacement. No acute abnormality. SINUSES AND MASTOIDS: There is mild mucosal disease within the floor of the maxillary sinuses. No acute abnormality. BONES AND SOFT TISSUES: Normal marrow signal. No acute soft tissue abnormality. IMPRESSION: 1. No acute intracranial abnormality. 2. Mild mucosal disease within the floor of the maxillary sinuses. Electronically signed by: Evalene Coho MD 03/27/2024 06:12 AM EDT RP Workstation: [id number]   CT Head Wo Contrast Result Date: 03/26/2024 CLINICAL DATA:  Mental status change, unknown cause EXAM: CT HEAD WITHOUT CONTRAST TECHNIQUE: Contiguous axial images were obtained from the base of the skull through the vertex without intravenous contrast. RADIATION DOSE REDUCTION: This exam was performed according to the departmental dose-optimization program which includes automated exposure control, adjustment of the mA and/or kV according to patient size and/or use of iterative reconstruction technique. COMPARISON:  04/30/2023 FINDINGS: Brain: No acute intracranial abnormality. Specifically, no hemorrhage, hydrocephalus, mass lesion, acute infarction, or significant intracranial injury. Vascular: No hyperdense vessel or unexpected calcification. Skull: No acute calvarial abnormality. Sinuses/Orbits: No acute findings Other: None IMPRESSION: No acute intracranial abnormality. Electronically Signed   By: Franky Crease M.D.   On: 03/26/2024 20:22     Assessment/Plan: Very pleasant 68 year old presents to the ED with severe mid back pain.  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  On exam he is 5 out of 5 in lower extremities.  He is not hyperreflexic but I do think he might have some slight clonus.  I do think you will need a decompression at this level at some point.  Will need to be cleared medically first.  Therapy for now   Suzen Lacks Bodi Palmeri 03/27/2024 4:45  PM

## 2024-03-27 NOTE — Progress Notes (Signed)
 Routine EEG completed, results pending Neurology review and interpretation

## 2024-03-27 NOTE — Plan of Care (Signed)
 Patient is complaining about headache and he has chronic back pain requesting for home pain medications.  Resume home Norco and continue Tylenol  as needed

## 2024-03-27 NOTE — TOC CM/SW Note (Signed)
 Transition of Care [facility]) - Inpatient Brief Assessment   Patient Details  Name: Aaron Marquez MRN: [id number] Date of Birth: 1955/11/28  Transition of Care [facility]) CM/SW Contact:    Tom-Johnson, Harvest Muskrat, RN Phone Number: 03/27/2024, 3:15 PM   Clinical Narrative:  Patient presented to the ED with increased confusion, upper Back pain, Lt Arm pain and staring spells.  Labs showed Creatinine 1.8, Troponins 25 and 26, WBC 4.6, CT negative for anything acute.  Admitted with Altered Mental Status. Patient currently Altered, not answering questions. On 2L O2 acute, does not use home O2. Wife, Aaron Marquez at bedside.   CM spoke with Aaron Marquez, states patient has three children who lives in [place] . Patient has five siblings also lives in [place] . Independent with his care and drive self prior to admit. Has a cane, walker, built-in shower seat at home.  PCP in [place], Natalie M, FNP and uses [facility] in [place].   No ICM needs or recommendations noted at this time.  Patient not Medically ready for discharge.  CM will continue to follow as patient progresses with care towards discharge.       Transition of Care Asessment: Insurance and Status: Insurance coverage has been reviewed Patient has primary care physician: Yes Home environment has been reviewed: Yes Prior level of function:: Modified Independent Prior/Current Home Services: No current home services Social Drivers of Health Review: SDOH reviewed no interventions necessary Readmission risk has been reviewed: Yes Transition of care needs: transition of care needs identified, TOC will continue to follow

## 2024-03-28 ENCOUNTER — Other Ambulatory Visit: Payer: Self-pay | Admitting: Neurosurgery

## 2024-03-28 DIAGNOSIS — I10 Essential (primary) hypertension: Secondary | ICD-10-CM | POA: Diagnosis not present

## 2024-03-28 DIAGNOSIS — G252 Other specified forms of tremor: Secondary | ICD-10-CM | POA: Diagnosis not present

## 2024-03-28 DIAGNOSIS — R4182 Altered mental status, unspecified: Secondary | ICD-10-CM | POA: Diagnosis not present

## 2024-03-28 DIAGNOSIS — N1832 Chronic kidney disease, stage 3b: Secondary | ICD-10-CM

## 2024-03-28 DIAGNOSIS — E785 Hyperlipidemia, unspecified: Secondary | ICD-10-CM

## 2024-03-28 DIAGNOSIS — M549 Dorsalgia, unspecified: Secondary | ICD-10-CM

## 2024-03-28 NOTE — Plan of Care (Signed)

## 2024-03-28 NOTE — TOC Progression Note (Addendum)
 Transition of Care [facility]) - Progression Note    Patient Details  Name: Aaron Marquez MRN: [id number] Date of Birth: 1956-02-09  Transition [facility]) CM/SW Contact  Lendia Dais, [place] Phone Number: 03/28/2024, 3:32 PM  Clinical Narrative:   CSW spoke to pt and pt's wife at bedside.  CSW informed pt of physical therapy recommendations of a SNF. CSW explained that it would temporary physical therapy and the pt was agreeable. Pt stated that they want to stay in [place] area.  CSW provided medicare list and the pt chose [name] as a preference and only wants a [facility] for now. CSW informed patient that bed offers depend on if the patients insurance is in network with facility. Pt stated understanding.   1623 - Bed at [name] was offered and accepted by the patient. CSW informed Soy at [facility] and stated that will forward to [employer] nursing Interior and spatial designer. Soy will be off and will return Wednesday 8/27.  ICM will continue follow.                      Expected Discharge Plan and Services                                               Social Drivers of Health (SDOH) Interventions SDOH Screenings   Food Insecurity: No Food Insecurity (03/27/2024)  Housing: Low Risk  (03/27/2024)  Transportation Needs: No Transportation Needs (03/27/2024)  Utilities: Not At Risk (03/27/2024)  Financial Resource Strain: Low Risk  (12/26/2023)   Received from [facility]  Physical Activity: Unknown (12/26/2023)   Received from [facility]  Social Connections: Patient Declined (03/27/2024)  Stress: No Stress Concern Present (12/26/2023)   Received from [facility]  Tobacco Use: Low Risk  (03/27/2024)    Readmission Risk Interventions    03/27/2024    3:11 PM  Readmission Risk Prevention Plan  Transportation Screening Complete  PCP or Specialist Appt within 5-7 Days Complete  Home Care Screening Complete  Medication Review (RN CM)  Referral to Pharmacy

## 2024-03-28 NOTE — Evaluation (Signed)
 Physical Therapy Evaluation Patient Details Name: Aaron Marquez MRN: [id number] DOB: 1956-02-22 Today's Date: 03/28/2024  History of Present Illness  Pt is a 68 y/o M admitted on 03/26/24 after presenting with c/o confusion, increasing upper back pain & LUE pain.  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 PMH: HTN, gout, lumbar hernia, GERD, back pain  Clinical Impression  Pt seen for PT evaluation with pt agreeable. Pt reports prior to admission he was mod I with SPC, living with his wife in a 1 level home with 2 steps without rails to enter. On this date, pt attempts to come to sitting EOB with PT providing cuing re: log rolling & assistance but pt unable to come to full upright sitting EOB 2/2 increased lower back pain radiating to RLE. PT encouraged to continue to attempt mobility as able & pt agreeable.        If plan is discharge home, recommend the following: A lot of help with walking and/or transfers;A lot of help with bathing/dressing/bathroom   Can travel by private vehicle   No    Equipment Recommendations Rolling walker (2 wheels);BSC/3in1  Recommendations for Other Services       Functional Status Assessment Patient has had a recent decline in their functional status and demonstrates the ability to make significant improvements in function in a reasonable and predictable amount of time.     Precautions / Restrictions Precautions Precautions: Fall;Back Restrictions Weight Bearing Restrictions Per Provider Order: No      Mobility  Bed Mobility Overal bed mobility: Needs Assistance Bed Mobility: Rolling, Sidelying to Sit, Sit to Sidelying Rolling: Supervision, Used rails Sidelying to sit: Min assist, Mod assist, HOB elevated, Used rails     Sit to sidelying: Supervision, HOB elevated, Used rails General bed mobility comments: cuing re: log rolling with pt holding to PT's hand to assist with sitting upright,  exiting R side of bed; pt unable to sit upright 2/2 increasing lower back pain radiating to RLE so pt returned to bed. Pt able to pull self up in bed with bed in trendelenburg position, bed rails, cuing to use BLE to assist & extra time.    Transfers                        Ambulation/Gait                  Stairs            Wheelchair Mobility     Tilt Bed    Modified Rankin (Stroke Patients Only)       Balance                                             Pertinent Vitals/Pain Pain Assessment Pain Assessment: Faces Faces Pain Scale: Hurts whole lot Pain Location: low back, radiating to RLE Pain Descriptors / Indicators: Discomfort, Numbness, Tingling, Radiating Pain Intervention(s): Monitored during session, Limited activity within patient's tolerance, Premedicated before session, Utilized relaxation techniques, Repositioned    Home Living Family/patient expects to be discharged to:: Private residence Living Arrangements: Spouse/significant other Available Help at Discharge: Family (wife works from home) Type of Home: House Home Access: Stairs to enter Entrance Stairs-Rails: None Secretary/administrator of Steps: 2   Home Layout: One level Home Equipment:  Cane - single point;Rollator (4 wheels)      Prior Function Prior Level of Function : Driving             Mobility Comments: Ambulatory with SPC, 1 fall in the past 6 months 2/2 the dog.       Extremity/Trunk Assessment   Upper Extremity Assessment Upper Extremity Assessment: Overall WFL for tasks assessed    Lower Extremity Assessment Lower Extremity Assessment: Generalized weakness;RLE deficits/detail RLE Deficits / Details: endorses numbness/tingling in R foot       Communication   Communication Communication: Impaired Factors Affecting Communication:  (speaks at low volume)    Cognition Arousal: Alert Behavior During Therapy: WFL for tasks  assessed/performed   PT - Cognitive impairments: No apparent impairments                         Following commands: Intact       Cueing Cueing Techniques: Verbal cues, Tactile cues, Visual cues     General Comments      Exercises     Assessment/Plan    PT Assessment Patient needs continued PT services  PT Problem List Pain;Decreased range of motion;Decreased activity tolerance;Decreased mobility;Decreased balance;Decreased safety awareness;Decreased knowledge of precautions;Decreased knowledge of use of DME;Impaired sensation       PT Treatment Interventions DME instruction;Balance training;Gait training;Neuromuscular re-education;Stair training;Patient/family education;Therapeutic exercise;Therapeutic activities;Functional mobility training    PT Goals (Current goals can be found in the Care Plan section)  Acute Rehab PT Goals Patient Stated Goal: decreased pain PT Goal Formulation: With patient Time For Goal Achievement: 04/11/24 Potential to Achieve Goals: Good    Frequency Min 2X/week     Co-evaluation               AM-PAC PT 6 Clicks Mobility  Outcome Measure Help needed turning from your back to your side while in a flat bed without using bedrails?: A Lot Help needed moving from lying on your back to sitting on the side of a flat bed without using bedrails?: Total Help needed moving to and from a bed to a chair (including a wheelchair)?: Total Help needed standing up from a chair using your arms (e.g., wheelchair or bedside chair)?: Total Help needed to walk in hospital room?: Total Help needed climbing 3-5 steps with a railing? : Total 6 Click Score: 7    End of Session   Activity Tolerance: Patient limited by pain Patient left: in bed;with call bell/phone within reach;with bed alarm set   PT Visit Diagnosis: Pain;Difficulty in walking, not elsewhere classified (R26.2) Pain - Right/Left: Right Pain - part of body: Leg (back)     Time: 9075-9063 PT Time Calculation (min) (ACUTE ONLY): 12 min   Charges:   PT Evaluation $PT Eval Moderate Complexity: 1 Mod   PT General Charges $$ ACUTE PT VISIT: 1 Visit         Richerd Pinal, PT, DPT 03/28/24, 9:50 AM   Richerd CHRISTELLA Pinal 03/28/2024, 9:49 AM

## 2024-03-28 NOTE — NC FL2 (Signed)
 [place]  MEDICAID FL2 LEVEL OF CARE FORM     IDENTIFICATION  Patient Name: Aaron Marquez Birthdate: 1956/01/22 Sex: male Admission Date (Current Location): 03/26/2024  [facility] and [place] Number:  Producer, television/film/video and Address:  [facility], [address], [place], [place] [address]      Provider Number: [id number]  Attending Physician Name and Address:  Cherlyn Labella, MD  Relative Name and Phone Number:  Tavaris, Eudy (Spouse)  [phone]    Current Level of Care: Hospital Recommended Level of Care: Skilled Nursing Facility Prior Approval Number:    Date Approved/Denied:   PASRR Number: [id number]  Discharge Plan: SNF    Current Diagnoses: Patient Active Problem List   Diagnosis Date Noted   Back pain 03/27/2024   Upper back pain 03/27/2024   History of gout 03/27/2024   Essential hypertension 03/27/2024   Altered mental status 03/26/2024   Atypical chest pain 01/18/2024   History of essential hypertension 01/18/2024   HLD (hyperlipidemia) 01/18/2024   GERD (gastroesophageal reflux disease) 01/18/2024   CKD stage 3b, GFR 30-44 ml/min (HCC) 01/18/2024   Parkinson's disease (HCC) 01/18/2024   Hypertensive crisis, unspecified 06/21/2023   Neurodegenerative cognitive impairment (HCC) 06/21/2023   PRES (posterior reversible encephalopathy syndrome) 06/21/2023   Resting tremor 06/21/2023    Orientation RESPIRATION BLADDER Height & Weight     Self, Time, Place  Normal Incontinent Weight: 265 lb (120.2 kg) Height:  6' 1 (185.4 cm)  BEHAVIORAL SYMPTOMS/MOOD NEUROLOGICAL BOWEL NUTRITION STATUS      Continent Diet (see dc summary)  AMBULATORY STATUS COMMUNICATION OF NEEDS Skin   Total Care Verbally Normal                       Personal Care Assistance Level of Assistance  Bathing, Feeding, Dressing Bathing Assistance: Maximum assistance Feeding assistance: Independent Dressing Assistance: Maximum assistance      Functional Limitations Info  Sight, Hearing, Speech Sight Info: Adequate Hearing Info: Adequate Speech Info: Adequate    SPECIAL CARE FACTORS FREQUENCY  PT (By licensed PT), OT (By licensed OT)     PT Frequency: 5x a week OT Frequency: 5x a week            Contractures Contractures Info: Not present    Additional Factors Info  Code Status, Allergies Code Status Info: Full Allergies Info: Nsaids  Zestril  (Lisinopril )           Current Medications (03/28/2024):  This is the current hospital active medication list Current Facility-Administered Medications  Medication Dose Route Frequency Provider Last Rate Last Admin   acetaminophen  (TYLENOL ) tablet 650 mg  650 mg Oral Q6H PRN Sundil, Subrina, MD       allopurinol  (ZYLOPRIM ) tablet 100 mg  100 mg Oral Daily Franky Redia SAILOR, MD   100 mg at 03/28/24 0751   amLODipine  (NORVASC ) tablet 10 mg  10 mg Oral Daily Sundil, Subrina, MD   10 mg at 03/28/24 0751   atorvastatin  (LIPITOR) tablet 80 mg  80 mg Oral Daily Kakrakandy, Arshad N, MD   80 mg at 03/28/24 0751   carbidopa -levodopa  (SINEMET  IR) 25-100 MG per tablet immediate release 1 tablet  1 tablet Oral BID Sundil, Subrina, MD   1 tablet at 03/28/24 9247   dexamethasone  (DECADRON ) injection 4 mg  4 mg Intravenous Q6H Akula, Vijaya, MD   4 mg at 03/28/24 1207   hydrALAZINE  (APRESOLINE ) injection 10 mg  10 mg Intravenous Q6H PRN Sundil,  Subrina, MD       HYDROcodone -acetaminophen  (NORCO/VICODIN) 5-325 MG per tablet 1 tablet  1 tablet Oral Q6H PRN Sundil, Subrina, MD   1 tablet at 03/28/24 0751   irbesartan  (AVAPRO ) tablet 75 mg  75 mg Oral Daily Kakrakandy, Arshad N, MD   75 mg at 03/28/24 0751   lidocaine  (LIDODERM ) 5 % 1 patch  1 patch Transdermal Q24H Akula, Vijaya, MD   1 patch at 03/27/24 1434   metoprolol  succinate (TOPROL -XL) 24 hr tablet 50 mg  50 mg Oral Daily Kakrakandy, Arshad N, MD   50 mg at 03/28/24 0751   morphine  (PF) 2 MG/ML injection 1-2 mg  1-2 mg  Intravenous Q4H PRN Akula, Vijaya, MD   2 mg at 03/27/24 1409   pantoprazole  (PROTONIX ) EC tablet 40 mg  40 mg Oral Daily Kakrakandy, Arshad N, MD   40 mg at 03/28/24 9247     Discharge Medications: Please see discharge summary for a list of discharge medications.  Relevant Imaging Results:  Relevant Lab Results:   Additional Information [id number]  Lendia Dais, LCSWA

## 2024-03-28 NOTE — Progress Notes (Signed)
 Subjective: Patient reports doing ok, has back pain   Objective: Vital signs in last 24 hours: Temp:  [98 F (36.7 C)-98.3 F (36.8 C)] 98 F (36.7 C) (08/22 0727) Pulse Rate:  [69-78] 78 (08/22 0727) Resp:  [17-19] 18 (08/22 0727) BP: (136-154)/(86-111) 154/111 (08/22 0727) SpO2:  [92 %-98 %] 98 % (08/22 0727)  Intake/Output from previous day: 08/21 0701 - 08/22 0700 In: 1070 [P.O.:1070] Out: 1350 [Urine:1350] Intake/Output this shift: No intake/output data recorded.  Neurologic: Grossly normal  Lab Results: Lab Results  Component Value Date   WBC 3.7 (L) 03/27/2024   HGB 15.3 03/27/2024   HCT 45.4 03/27/2024   MCV 78.3 (L) 03/27/2024   PLT 141 (L) 03/27/2024   Lab Results  Component Value Date   INR 1.0 04/30/2023   BMET Lab Results  Component Value Date   NA 140 03/27/2024   K 3.7 03/27/2024   CL 105 03/27/2024   CO2 23 03/27/2024   GLUCOSE 98 03/27/2024   BUN 19 03/27/2024   CREATININE 1.73 (H) 03/27/2024   CALCIUM  8.5 (L) 03/27/2024    Studies/Results: EEG adult Result Date: 03/27/2024 Shelton Arlin KIDD, MD     03/27/2024  5:32 PM Patient Name: Aaron Marquez MRN: [id number] Epilepsy Attending: Arlin KIDD Shelton Referring Physician/Provider: Franky Redia SAILOR, MD Date: 03/27/2024 Duration: 29.21 mins Patient history: 68yo M with ams. EEG to evaluate for seizure Level of alertness: Awake AEDs during EEG study: None Technical aspects: This EEG study was done with scalp electrodes positioned according to the 10-20 International system of electrode placement. Electrical activity was reviewed with band pass filter of 1-70Hz , sensitivity of 7 uV/mm, display speed of 61mm/sec with a 60Hz  notched filter applied as appropriate. EEG data were recorded continuously and digitally stored.  Video monitoring was available and reviewed as appropriate. Description: The posterior dominant rhythm consists of 7.5Hz  activity of moderate voltage (25-35 uV) seen predominantly in  posterior head regions, symmetric and reactive to eye opening and eye closing. Hyperventilation and photic stimulation were not performed.   IMPRESSION: This study is within normal limits. No seizures or epileptiform discharges were seen throughout the recording. A normal interictal EEG does not exclude the diagnosis of epilepsy. Arlin KIDD Shelton   MR THORACIC SPINE WO CONTRAST Result Date: 03/27/2024 EXAM: MRI THORACIC SPINE WITHOUT INTRAVENOUS CONTRAST 03/27/2024 05:52:46 AM TECHNIQUE: Multiplanar multisequence MRI of the thoracic spine was performed without the administration of intravenous contrast. COMPARISON: None available. CLINICAL HISTORY: Mid-back pain, infection suspected, positive xray/CT. FINDINGS: BONES AND ALIGNMENT: Normal alignment. Normal vertebral body heights. Bone marrow signal is unremarkable. No abnormal enhancement. SPINAL CORD: Normal spinal cord volume. Normal spinal cord signal. SOFT TISSUES: Unremarkable. DEGENERATIVE CHANGES: There is a left posterolateral disc protrusion at T11-12, which is impressing upon the ventral surface of the spinal cord and causing moderate left-sided spinal canal stenosis. The protruded disc measures approximately 6 mm in AP dimension and 13 mm in transverse dimension and 19 mm in Craniocaudad length. There is minimal posterior disc bulging also present at T3-4, T5-6, T6-7, T7-8, T8-9, T9-10 and T10-11, with very mild central spinal canal stenosis at each level, but no impingement upon the spinal cord or nerve roots. IMPRESSION: 1. Left posterolateral disc protrusion at T11-12 impressing upon the ventral surface of the spinal cord and causing moderate left-sided spinal canal stenosis. 2. Minimal posterior disc bulging at T3-4, T5-6, T6-7, T7-8, T8-9, T9-10, and T10-11 with very mild central spinal canal stenosis at each level, without impingement upon  the spinal cord or nerve roots. Electronically signed by: Evalene Coho MD 03/27/2024 06:22 AM EDT RP  Workstation: [id number]   MR CERVICAL SPINE WO CONTRAST Result Date: 03/27/2024 EXAM: MRI CERVICAL SPINE WITHOUT CONTRAST 03/27/2024 05:52:29 AM TECHNIQUE: Multiplanar multisequence MRI of the cervical spine was performed. COMPARISON: Cervical spine series dated 11/17/2023. CLINICAL HISTORY: Myelopathy, acute, cervical spine. FINDINGS: BONES AND ALIGNMENT: Reversal of the normal cervical lordosis. Normal vertebral body heights. Bone marrow signal is unremarkable. SPINAL CORD: Normal spinal cord size. No abnormal spinal cord signal. SOFT TISSUES: No paraspinal mass. C2-C3: Disc space narrowing. No significant disc herniation. No spinal canal stenosis or neural foraminal narrowing. C3-C4: Disc space narrowing and endplate ridging causing mild central spinal canal stenosis and mild-to-moderate left neural foraminal stenosis. C4-C5: Diffuse endplate ridging causing mild-to-moderate central spinal canal stenosis and mild-to-moderate bilateral neural foraminal stenosis. C5-C6: Mild central spinal canal stenosis and moderate left neural foraminal stenosis. No significant disc herniation. C6-C7: Diffuse disc bulging and endplate ridging with mild central spinal canal stenosis and mild-to-moderate left neural foraminal stenosis. C7-T1: No significant disc herniation. No spinal canal stenosis or neural foraminal narrowing. IMPRESSION: 1. Reversal of the normal cervical lordosis. 2. Disc space narrowing at C2-3 and C3-4. 3. Mild central spinal canal stenosis at C3-4, C5-6, and C6-7. 4. Mild-to-moderate left neural foraminal stenosis at C3-4 and C6-7. 5. Mild-to-moderate bilateral neural foraminal stenosis at C4-5. 6. Moderate left neural foraminal stenosis at C5-6. Electronically signed by: Evalene Coho MD 03/27/2024 06:15 AM EDT RP Workstation: [id number]   MR BRAIN WO CONTRAST Result Date: 03/27/2024 EXAM: MRI BRAIN WITHOUT CONTRAST 03/27/2024 05:52:03 AM TECHNIQUE: Multiplanar multisequence MRI of the head/brain  was performed without the administration of intravenous contrast. COMPARISON: CT of the head dated 03/28/2024. CLINICAL HISTORY: Neuro deficit, acute, stroke suspected. FINDINGS: BRAIN AND VENTRICLES: No convincing evidence of restricted diffusion. No acute infarct. No intracranial hemorrhage. No mass. No midline shift. No hydrocephalus. The sella is unremarkable. Normal flow voids. ORBITS: The patient is status post bilateral lens replacement. No acute abnormality. SINUSES AND MASTOIDS: There is mild mucosal disease within the floor of the maxillary sinuses. No acute abnormality. BONES AND SOFT TISSUES: Normal marrow signal. No acute soft tissue abnormality. IMPRESSION: 1. No acute intracranial abnormality. 2. Mild mucosal disease within the floor of the maxillary sinuses. Electronically signed by: Evalene Coho MD 03/27/2024 06:12 AM EDT RP Workstation: [id number]   CT Head Wo Contrast Result Date: 03/26/2024 CLINICAL DATA:  Mental status change, unknown cause EXAM: CT HEAD WITHOUT CONTRAST TECHNIQUE: Contiguous axial images were obtained from the base of the skull through the vertex without intravenous contrast. RADIATION DOSE REDUCTION: This exam was performed according to the departmental dose-optimization program which includes automated exposure control, adjustment of the mA and/or kV according to patient size and/or use of iterative reconstruction technique. COMPARISON:  04/30/2023 FINDINGS: Brain: No acute intracranial abnormality. Specifically, no hemorrhage, hydrocephalus, mass lesion, acute infarction, or significant intracranial injury. Vascular: No hyperdense vessel or unexpected calcification. Skull: No acute calvarial abnormality. Sinuses/Orbits: No acute findings Other: None IMPRESSION: No acute intracranial abnormality. Electronically Signed   By: Franky Crease M.D.   On: 03/26/2024 20:22    Assessment/Plan: T11-T12 herniate disc. Continue therapy for now. We will likely plan on a  microdiskectomy on Monday.    LOS: 1 day    Suzen Lacks Rushil Kimbrell 03/28/2024, 2:45 PM

## 2024-03-28 NOTE — Progress Notes (Signed)
 [facility]                                                                               Aaron Marquez, is a 68 y.o. male, DOB - 1955-09-16, FMW:[id number] Admit date - 03/26/2024    Outpatient Primary MD for the patient is Stroud, Natalie M, FNP  LOS - 1  days    Brief summary    Aaron Marquez is a 68 y.o. male with history of hypertension, gout, back pain who had epidural injection in the low back about 2 weeks ago was brought to the ER after patient was found to be confused with increasing pain in the upper back and also complained of some left arm pain.  MRI T spine shows  Left posterolateral disc protrusion at T11-12 impressing upon the ventral surface of the spinal cord and causing moderate left-sided spinal canal stenosis.  Minimal posterior disc bulging at T3-4, T5-6, T6-7, T7-8, T8-9, T9-10, and T10-11 with very mild central spinal canal stenosis at each level, without impingement upon the spinal cord or nerve roots. MRI brain without contrast is negative for acute pathology.  Neurosurgery consulted.  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 No bowel or bladder incontinence. Patient reports some tremors in the left arm attributed to Parkinson's .  Uncontrolled with oral pain  meds. Added IV morphine  1 to 2 mg in addition to hydrocodone  5 -325 mg every 6 hours.  Adding IV decadron .  NS consulted , recommendations to follow.    Hypertension No changes in meds.  Continue with amlodipine , metoprolol  and prn hydralazine .    Hyperlipidemia Resume statin.   Mildly elevated troponins No chest pain. Last echo in 01/2024 showed LVEF of 65% to 70%, no regional wall abn. Right ventricular systolic function is normal.  EKG shows sinus rhythm.   Stage 3b CKD Creatinine at baseline.   Estimated body mass index is 34.96 kg/m as  calculated from the following:   Height as of this encounter: 6' 1 (1.854 m).   Weight as of this encounter: 120.2 kg.  Code Status: full code.  DVT Prophylaxis:  SCDs Start: 03/27/24 0436   Level of Care: Level of care: Telemetry Medical Family Communication: none at bedside. Called wife and spoke to her and updated her.   Disposition Plan:     Remains inpatient appropriate:  pending clinical improvement.   Procedures:  MRI T SPINE  Consultants:   Neurosurgery   Antimicrobials:   Anti-infectives (From admission, onward)    None        Medications  Scheduled Meds:  allopurinol   100 mg Oral Daily   amLODipine   10 mg Oral Daily   atorvastatin   80 mg Oral Daily   carbidopa -levodopa   1 tablet Oral BID   dexamethasone  (DECADRON ) injection  4 mg Intravenous Q6H   irbesartan   75 mg Oral Daily   lidocaine   1 patch Transdermal Q24H   metoprolol  succinate  50 mg Oral Daily   pantoprazole   40 mg Oral Daily  Continuous Infusions: PRN Meds:.acetaminophen , hydrALAZINE , HYDROcodone -acetaminophen , morphine  injection    Subjective:   Aaron Marquez was seen and examined today. Pain is better than yesterday.   Objective:   Vitals:   03/27/24 1818 03/27/24 2051 03/28/24 0445 03/28/24 0727  BP: (!) 142/86 (!) 151/87 136/88 (!) 154/111  Pulse: 70 69 76 78  Resp: 19 19 17 18   Temp: 98.2 F (36.8 C) 98.3 F (36.8 C) 98.3 F (36.8 C) 98 F (36.7 C)  TempSrc:  Oral Oral Oral  SpO2: 94% 96% 92% 98%  Weight:      Height:        Intake/Output Summary (Last 24 hours) at 03/28/2024 0944 Last data filed at 03/28/2024 0600 Gross per 24 hour  Intake 1020 ml  Output 1150 ml  Net -130 ml   Filed Weights   03/26/24 1946  Weight: 120.2 kg     Exam General exam: Appears calm and comfortable  Respiratory system: Clear to auscultation. Respiratory effort normal. Cardiovascular system: S1 & S2 heard, RRR. Gastrointestinal system: Abdomen is nondistended, soft and  nontender.  Central nervous system: Alert and oriented.  Extremities: no edema.  Skin: No rashes,  Psychiatry: Mood & affect appropriate.      Data Reviewed:  I have personally reviewed following labs and imaging studies   CBC Lab Results  Component Value Date   WBC 3.7 (L) 03/27/2024   RBC 5.80 03/27/2024   HGB 15.3 03/27/2024   HCT 45.4 03/27/2024   MCV 78.3 (L) 03/27/2024   MCH 26.4 03/27/2024   PLT 141 (L) 03/27/2024   MCHC 33.7 03/27/2024   RDW 13.2 03/27/2024   LYMPHSABS 1.3 03/27/2024   MONOABS 0.4 03/27/2024   EOSABS 0.1 03/27/2024   BASOSABS 0.0 03/27/2024     Last metabolic panel Lab Results  Component Value Date   NA 140 03/27/2024   K 3.7 03/27/2024   CL 105 03/27/2024   CO2 23 03/27/2024   BUN 19 03/27/2024   CREATININE 1.73 (H) 03/27/2024   GLUCOSE 98 03/27/2024   GFRNONAA 43 (L) 03/27/2024   GFRAA 45 (L) 06/01/2018   CALCIUM  8.5 (L) 03/27/2024   PROT 6.0 (L) 03/27/2024   ALBUMIN 3.2 (L) 03/27/2024   LABGLOB 2.9 06/21/2023   LABGLOB 2.3 06/21/2023   AGRATIO 1.3 06/21/2023   BILITOT 1.1 03/27/2024   ALKPHOS 60 03/27/2024   AST 15 03/27/2024   ALT 7 03/27/2024   ANIONGAP 12 03/27/2024    CBG (last 3)  Recent Labs    03/26/24 1944  GLUCAP 85      Coagulation Profile: No results for input(s): INR, PROTIME in the last 168 hours.   Radiology Studies: EEG adult Result Date: 03/27/2024 Shelton Arlin KIDD, MD     03/27/2024  5:32 PM Patient Name: Aaron Marquez MRN: [id number] Epilepsy Attending: Arlin KIDD Shelton Referring Physician/Provider: Franky Redia SAILOR, MD Date: 03/27/2024 Duration: 29.21 mins Patient history: 68yo M with ams. EEG to evaluate for seizure Level of alertness: Awake AEDs during EEG study: None Technical aspects: This EEG study was done with scalp electrodes positioned according to the 10-20 International system of electrode placement. Electrical activity was reviewed with band pass filter of 1-70Hz , sensitivity of 7  uV/mm, display speed of 50mm/sec with a 60Hz  notched filter applied as appropriate. EEG data were recorded continuously and digitally stored.  Video monitoring was available and reviewed as appropriate. Description: The posterior dominant rhythm consists of 7.5Hz  activity of moderate voltage (25-35 uV) seen predominantly in posterior  head regions, symmetric and reactive to eye opening and eye closing. Hyperventilation and photic stimulation were not performed.   IMPRESSION: This study is within normal limits. No seizures or epileptiform discharges were seen throughout the recording. A normal interictal EEG does not exclude the diagnosis of epilepsy. Arlin MALVA Krebs   MR THORACIC SPINE WO CONTRAST Result Date: 03/27/2024 EXAM: MRI THORACIC SPINE WITHOUT INTRAVENOUS CONTRAST 03/27/2024 05:52:46 AM TECHNIQUE: Multiplanar multisequence MRI of the thoracic spine was performed without the administration of intravenous contrast. COMPARISON: None available. CLINICAL HISTORY: Mid-back pain, infection suspected, positive xray/CT. FINDINGS: BONES AND ALIGNMENT: Normal alignment. Normal vertebral body heights. Bone marrow signal is unremarkable. No abnormal enhancement. SPINAL CORD: Normal spinal cord volume. Normal spinal cord signal. SOFT TISSUES: Unremarkable. DEGENERATIVE CHANGES: There is a left posterolateral disc protrusion at T11-12, which is impressing upon the ventral surface of the spinal cord and causing moderate left-sided spinal canal stenosis. The protruded disc measures approximately 6 mm in AP dimension and 13 mm in transverse dimension and 19 mm in Craniocaudad length. There is minimal posterior disc bulging also present at T3-4, T5-6, T6-7, T7-8, T8-9, T9-10 and T10-11, with very mild central spinal canal stenosis at each level, but no impingement upon the spinal cord or nerve roots. IMPRESSION: 1. Left posterolateral disc protrusion at T11-12 impressing upon the ventral surface of the spinal cord and  causing moderate left-sided spinal canal stenosis. 2. Minimal posterior disc bulging at T3-4, T5-6, T6-7, T7-8, T8-9, T9-10, and T10-11 with very mild central spinal canal stenosis at each level, without impingement upon the spinal cord or nerve roots. Electronically signed by: Evalene Coho MD 03/27/2024 06:22 AM EDT RP Workstation: [id number]   MR CERVICAL SPINE WO CONTRAST Result Date: 03/27/2024 EXAM: MRI CERVICAL SPINE WITHOUT CONTRAST 03/27/2024 05:52:29 AM TECHNIQUE: Multiplanar multisequence MRI of the cervical spine was performed. COMPARISON: Cervical spine series dated 11/17/2023. CLINICAL HISTORY: Myelopathy, acute, cervical spine. FINDINGS: BONES AND ALIGNMENT: Reversal of the normal cervical lordosis. Normal vertebral body heights. Bone marrow signal is unremarkable. SPINAL CORD: Normal spinal cord size. No abnormal spinal cord signal. SOFT TISSUES: No paraspinal mass. C2-C3: Disc space narrowing. No significant disc herniation. No spinal canal stenosis or neural foraminal narrowing. C3-C4: Disc space narrowing and endplate ridging causing mild central spinal canal stenosis and mild-to-moderate left neural foraminal stenosis. C4-C5: Diffuse endplate ridging causing mild-to-moderate central spinal canal stenosis and mild-to-moderate bilateral neural foraminal stenosis. C5-C6: Mild central spinal canal stenosis and moderate left neural foraminal stenosis. No significant disc herniation. C6-C7: Diffuse disc bulging and endplate ridging with mild central spinal canal stenosis and mild-to-moderate left neural foraminal stenosis. C7-T1: No significant disc herniation. No spinal canal stenosis or neural foraminal narrowing. IMPRESSION: 1. Reversal of the normal cervical lordosis. 2. Disc space narrowing at C2-3 and C3-4. 3. Mild central spinal canal stenosis at C3-4, C5-6, and C6-7. 4. Mild-to-moderate left neural foraminal stenosis at C3-4 and C6-7. 5. Mild-to-moderate bilateral neural foraminal  stenosis at C4-5. 6. Moderate left neural foraminal stenosis at C5-6. Electronically signed by: Evalene Coho MD 03/27/2024 06:15 AM EDT RP Workstation: [id number]   MR BRAIN WO CONTRAST Result Date: 03/27/2024 EXAM: MRI BRAIN WITHOUT CONTRAST 03/27/2024 05:52:03 AM TECHNIQUE: Multiplanar multisequence MRI of the head/brain was performed without the administration of intravenous contrast. COMPARISON: CT of the head dated 03/28/2024. CLINICAL HISTORY: Neuro deficit, acute, stroke suspected. FINDINGS: BRAIN AND VENTRICLES: No convincing evidence of restricted diffusion. No acute infarct. No intracranial hemorrhage. No mass. No midline shift. No hydrocephalus. The  sella is unremarkable. Normal flow voids. ORBITS: The patient is status post bilateral lens replacement. No acute abnormality. SINUSES AND MASTOIDS: There is mild mucosal disease within the floor of the maxillary sinuses. No acute abnormality. BONES AND SOFT TISSUES: Normal marrow signal. No acute soft tissue abnormality. IMPRESSION: 1. No acute intracranial abnormality. 2. Mild mucosal disease within the floor of the maxillary sinuses. Electronically signed by: Evalene Coho MD 03/27/2024 06:12 AM EDT RP Workstation: [id number]   CT Head Wo Contrast Result Date: 03/26/2024 CLINICAL DATA:  Mental status change, unknown cause EXAM: CT HEAD WITHOUT CONTRAST TECHNIQUE: Contiguous axial images were obtained from the base of the skull through the vertex without intravenous contrast. RADIATION DOSE REDUCTION: This exam was performed according to the departmental dose-optimization program which includes automated exposure control, adjustment of the mA and/or kV according to patient size and/or use of iterative reconstruction technique. COMPARISON:  04/30/2023 FINDINGS: Brain: No acute intracranial abnormality. Specifically, no hemorrhage, hydrocephalus, mass lesion, acute infarction, or significant intracranial injury. Vascular: No hyperdense vessel or  unexpected calcification. Skull: No acute calvarial abnormality. Sinuses/Orbits: No acute findings Other: None IMPRESSION: No acute intracranial abnormality. Electronically Signed   By: Franky Crease M.D.   On: 03/26/2024 20:22       Elgie Butter M.D. [facility] 03/28/2024, 9:44 AM  Available via Epic secure chat 7am-7pm After 7 pm, please refer to night coverage provider listed on amion.

## 2024-03-28 NOTE — Evaluation (Signed)
 Occupational Therapy Evaluation Patient Details Name: Aaron Marquez MRN: [id number] DOB: 02-15-1956 Today's Date: 03/28/2024   History of Present Illness   Pt is a 68 y/o M admitted on 03/26/24 after presenting with c/o confusion, increasing upper back pain & LUE pain.  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 PMH: HTN, gout, lumbar hernia, GERD, back pain     Clinical Impressions Pt was ambulating with a cane. He is a poor reporter of PLOF and highly distracted by needing to have BM. Pt presents with back pain radiating down R LE, impaired cognition and decreased standing balance. Pt has poor awareness of safety and deficits. He needs mod assist for bed mobility and min assist to stand from elevated surface and ambulate with RW. He requires set up to total assist for ADLs. Pt to have back surgery this admission. Recommending HHOT, will depend on progress post surgery.      If plan is discharge home, recommend the following:   A little help with walking and/or transfers;A lot of help with bathing/dressing/bathroom;Assistance with cooking/housework;Direct supervision/assist for medications management;Direct supervision/assist for financial management;Assist for transportation;Help with stairs or ramp for entrance     Functional Status Assessment   Patient has had a recent decline in their functional status and demonstrates the ability to make significant improvements in function in a reasonable and predictable amount of time.     Equipment Recommendations   BSC/3in1     Recommendations for Other Services         Precautions/Restrictions   Precautions Precautions: Fall;Back Precaution/Restrictions Comments: began educating pt in no bending, lifting or twisting Restrictions Weight Bearing Restrictions Per Provider Order: No     Mobility Bed Mobility Overal bed mobility: Needs Assistance Bed Mobility: Rolling,  Sidelying to Sit Rolling: Mod assist, Used rails Sidelying to sit: Mod assist       General bed mobility comments: verbal cues and increased time for log roll technique with use of rail    Transfers Overall transfer level: Needs assistance Equipment used: Rolling walker (2 wheels) Transfers: Sit to/from Stand Sit to Stand: Min assist, +2 safety/equipment, From elevated surface           General transfer comment: increased time and effort      Balance Overall balance assessment: Needs assistance   Sitting balance-Leahy Scale: Fair     Standing balance support: Bilateral upper extremity supported Standing balance-Leahy Scale: Poor Standing balance comment: heavy reliance on UEs                           ADL either performed or assessed with clinical judgement   ADL Overall ADL's : Needs assistance/impaired Eating/Feeding: Set up;Sitting   Grooming: Wash/dry hands;Set up;Sitting   Upper Body Bathing: Minimal assistance;Sitting   Lower Body Bathing: Total assistance;Sit to/from stand   Upper Body Dressing : Minimal assistance;Sitting   Lower Body Dressing: Total assistance;Sit to/from stand   Toilet Transfer: Minimal assistance;Stand-pivot;BSC/3in1;Rolling walker (2 wheels)   Toileting- Clothing Manipulation and Hygiene: Total assistance;Sit to/from stand       Functional mobility during ADLs: Minimal assistance;+2 for safety/equipment;Rolling walker (2 wheels)       Vision Ability to See in Adequate Light: 0 Adequate Patient Visual Report: No change from baseline       Perception         Praxis         Pertinent  Vitals/Pain Pain Assessment Pain Assessment: Faces Faces Pain Scale: Hurts even more Pain Location: low back, radiating to RLE Pain Descriptors / Indicators: Discomfort, Numbness, Tingling, Radiating Pain Intervention(s): Monitored during session, Premedicated before session     Extremity/Trunk Assessment Upper Extremity  Assessment Upper Extremity Assessment: Right hand dominant;LUE deficits/detail LUE Deficits / Details: tremor   Lower Extremity Assessment Lower Extremity Assessment: Defer to PT evaluation   Cervical / Trunk Assessment Cervical / Trunk Assessment: Other exceptions Cervical / Trunk Exceptions: obesity, back pain   Communication Communication Communication: No apparent difficulties   Cognition Arousal: Alert Behavior During Therapy: Flat affect Cognition: No family/caregiver present to determine baseline, Cognition impaired     Awareness: Intellectual awareness impaired, Online awareness impaired Memory impairment (select all impairments): Short-term memory, Working memory Attention impairment (select first level of impairment): Sustained attention Executive functioning impairment (select all impairments): Initiation, Reasoning, Problem solving                   Following commands: Impaired Following commands impaired: Follows one step commands with increased time     Cueing  General Comments   Cueing Techniques: Verbal cues;Tactile cues;Visual cues      Exercises     Shoulder Instructions      Home Living Family/patient expects to be discharged to:: Private residence Living Arrangements: Spouse/significant other Available Help at Discharge: Family;Other (Comment) (wife works from home) Type of Home: House Home Access: Stairs to enter Secretary/administrator of Steps: 2 Entrance Stairs-Rails: None Home Layout: One level     Bathroom Shower/Tub: Tub/shower unit;Walk-in shower   Bathroom Toilet: Standard     Home Equipment: Cane - single point;Rollator (4 wheels)          Prior Functioning/Environment Prior Level of Function : Needs assist             Mobility Comments: Ambulatory with SPC, 1 fall in the past 6 months 2/2 the dog. ADLs Comments: pt reports wife helps with whatever I need    OT Problem List: Decreased strength;Decreased  activity tolerance;Impaired balance (sitting and/or standing);Decreased cognition;Decreased safety awareness;Decreased knowledge of use of DME or AE;Obesity;Pain   OT Treatment/Interventions: Self-care/ADL training;DME and/or AE instruction;Therapeutic activities;Cognitive remediation/compensation;Patient/family education;Balance training      OT Goals(Current goals can be found in the care plan section)   Acute Rehab OT Goals OT Goal Formulation: With patient Time For Goal Achievement: 04/11/24 Potential to Achieve Goals: Good ADL Goals Pt Will Perform Grooming: with supervision;standing Pt Will Perform Upper Body Dressing: with supervision;sitting Pt Will Transfer to Toilet: with supervision;ambulating;bedside commode Pt Will Perform Toileting - Clothing Manipulation and hygiene: with supervision;sit to/from stand Additional ADL Goal #1: Pt will complete bed mobility with min assist in preparation for ADLs. Additional ADL Goal #2: Pt will state back precautions as instructed.   OT Frequency:  Min 2X/week    Co-evaluation              AM-PAC OT 6 Clicks Daily Activity     Outcome Measure Help from another person eating meals?: None Help from another person taking care of personal grooming?: A Little Help from another person toileting, which includes using toliet, bedpan, or urinal?: Total Help from another person bathing (including washing, rinsing, drying)?: A Lot Help from another person to put on and taking off regular upper body clothing?: A Little Help from another person to put on and taking off regular lower body clothing?: Total 6 Click Score: 14   End of  Session Equipment Utilized During Treatment: Rolling walker (2 wheels);Gait belt Nurse Communication: Mobility status  Activity Tolerance: Treatment limited secondary to medical complications (Comment) (dizziness with attempt to ambulate to bathroom) Patient left: Other (comment);with nursing/sitter in  room;with call bell/phone within reach (on [facility])  OT Visit Diagnosis: Unsteadiness on feet (R26.81);Other abnormalities of gait and mobility (R26.89);Pain;Other symptoms and signs involving cognitive function;Muscle weakness (generalized) (M62.81)                Time: 8849-8779 OT Time Calculation (min): 30 min Charges:  OT General Charges $OT Visit: 1 Visit OT Evaluation $OT Eval Moderate Complexity: 1 Mod OT Treatments $Self Care/Home Management : 8-22 mins  Mliss HERO, OTR/L Acute Rehabilitation Services Office: [phone]   Aaron Marquez 03/28/2024, 3:06 PM

## 2024-03-29 DIAGNOSIS — G252 Other specified forms of tremor: Secondary | ICD-10-CM | POA: Diagnosis not present

## 2024-03-29 DIAGNOSIS — M549 Dorsalgia, unspecified: Secondary | ICD-10-CM | POA: Diagnosis not present

## 2024-03-29 DIAGNOSIS — R4182 Altered mental status, unspecified: Secondary | ICD-10-CM | POA: Diagnosis not present

## 2024-03-29 DIAGNOSIS — I10 Essential (primary) hypertension: Secondary | ICD-10-CM | POA: Diagnosis not present

## 2024-03-29 NOTE — Progress Notes (Signed)
 Physical Therapy Treatment Patient Details Name: Aaron Marquez MRN: [id number] DOB: 03/10/1956 Today's Date: 03/29/2024   History of Present Illness Pt is a 68 y/o M admitted on 03/26/24 after presenting with c/o confusion, increasing upper back pain & LUE pain.  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 Scheduled to undergo discectomy 8/25 with Dr. Onetha. PMH: HTN, gout, lumbar hernia, GERD, back pain    PT Comments  Pt received in recliner. Agreeable to participation in therapy and requesting assist to bathroom for BM. Mod assist sit to stand from recliner, min assist sit to stand from [facility], min assist amb 15' x 2 with RW, and mod assist sit to sidelying. Pt demo very slow, guarded gait with shuffle steps. Pt in bed at end of session. Plan is for T11/12 discectomy Monday, 8/25.     If plan is discharge home, recommend the following: A lot of help with walking and/or transfers;A lot of help with bathing/dressing/bathroom   Can travel by private vehicle     No  Equipment Recommendations  Rolling walker (2 wheels);BSC/3in1    Recommendations for Other Services       Precautions / Restrictions Precautions Precautions: Fall;Back     Mobility  Bed Mobility Overal bed mobility: Needs Assistance Bed Mobility: Sit to Sidelying, Rolling Rolling: Mod assist, Used rails       Sit to sidelying: Mod assist, Used rails General bed mobility comments: increased time, assist with trunk and BLE    Transfers Overall transfer level: Needs assistance Equipment used: Rolling walker (2 wheels) Transfers: Sit to/from Stand Sit to Stand: Mod assist, Min assist           General transfer comment: mod assist from recliner and min assist from [facility], increased time and effort    Ambulation/Gait Ambulation/Gait assistance: Min assist Gait Distance (Feet): 15 Feet (x 2) Assistive device: Rolling walker (2 wheels) Gait  Pattern/deviations: Step-through pattern, Decreased stride length, Shuffle, Trunk flexed Gait velocity: slow, guarded gait Gait velocity interpretation: <1.8 ft/sec, indicate of risk for recurrent falls   General Gait Details: amb to/from bathroom, heavy reliance on RW   Stairs             Wheelchair Mobility     Tilt Bed    Modified Rankin (Stroke Patients Only)       Balance Overall balance assessment: Needs assistance Sitting-balance support: Feet supported, No upper extremity supported Sitting balance-Leahy Scale: Good     Standing balance support: During functional activity, Bilateral upper extremity supported, Reliant on assistive device for balance Standing balance-Leahy Scale: Poor                              Communication Communication Communication: No apparent difficulties  Cognition Arousal: Alert Behavior During Therapy: WFL for tasks assessed/performed, Flat affect   PT - Cognitive impairments: No apparent impairments                         Following commands: Impaired Following commands impaired: Follows one step commands with increased time    Cueing Cueing Techniques: Verbal cues, Tactile cues, Visual cues  Exercises      General Comments General comments (skin integrity, edema, etc.): VSS on RA      Pertinent Vitals/Pain Pain Assessment Pain Assessment: Faces Faces Pain Scale: Hurts little more Pain Location: low back, radiating to  RLE Pain Descriptors / Indicators: Discomfort, Numbness, Tingling, Radiating Pain Intervention(s): Monitored during session, Limited activity within patient's tolerance, Repositioned    Home Living                          Prior Function            PT Goals (current goals can now be found in the care plan section) Acute Rehab PT Goals Patient Stated Goal: walk better Progress towards PT goals: Progressing toward goals    Frequency    Min 2X/week      PT  Plan      Co-evaluation              AM-PAC PT 6 Clicks Mobility   Outcome Measure  Help needed turning from your back to your side while in a flat bed without using bedrails?: A Lot Help needed moving from lying on your back to sitting on the side of a flat bed without using bedrails?: A Lot Help needed moving to and from a bed to a chair (including a wheelchair)?: A Little Help needed standing up from a chair using your arms (e.g., wheelchair or bedside chair)?: A Lot Help needed to walk in hospital room?: A Little Help needed climbing 3-5 steps with a railing? : Total 6 Click Score: 13    End of Session Equipment Utilized During Treatment: Gait belt Activity Tolerance: Patient tolerated treatment well Patient left: in bed;with call bell/phone within reach;with bed alarm set Nurse Communication: Mobility status PT Visit Diagnosis: Pain;Difficulty in walking, not elsewhere classified (R26.2) Pain - Right/Left: Right Pain - part of body: Leg     Time: 8593-8554 PT Time Calculation (min) (ACUTE ONLY): 39 min  Charges:    $Gait Training: 23-37 mins $Therapeutic Activity: 8-22 mins PT General Charges $$ ACUTE PT VISIT: 1 Visit                     Sari MATSU., PT  Office # [phone]    Erven Sari Shaker 03/29/2024, 2:59 PM

## 2024-03-29 NOTE — Progress Notes (Signed)
 Subjective: [name] is alert and pleasant.  Objective: Vital signs in last 24 hours: Temp:  [97.6 F (36.4 C)-98.3 F (36.8 C)] 97.6 F (36.4 C) (08/23 0716) Pulse Rate:  [59-73] 59 (08/23 0716) Resp:  [18] 18 (08/23 0716) BP: (118-137)/(61-86) 132/74 (08/23 0716) SpO2:  [95 %-97 %] 95 % (08/23 0716) Estimated body mass index is 34.96 kg/m as calculated from the following:   Height as of this encounter: 6' 1 (1.854 m).   Weight as of this encounter: 120.2 kg.   Intake/Output from previous day: 08/22 0701 - 08/23 0700 In: -  Out: 350 [Urine:350] Intake/Output this shift: No intake/output data recorded.  Physical exam [name] moves his lower extremities well.  Lab Results: Recent Labs    03/26/24 1954 03/27/24 0726  WBC 4.6 3.7*  HGB 16.6 15.3  HCT 49.5 45.4  PLT 169 141*   BMET Recent Labs    03/26/24 1954 03/27/24 0726  NA 139 140  K 4.2 3.7  CL 104 105  CO2 23 23  GLUCOSE 98 98  BUN 21 19  CREATININE 1.89* 1.73*  CALCIUM  9.0 8.5*    Studies/Results: EEG adult Result Date: 03/27/2024 Aaron Arlin KIDD, MD     03/27/2024  5:32 PM Aaron Name: Aaron Marquez MRN: [id number] Epilepsy Attending: Arlin Marquez Aaron Referring Physician/Provider: Franky Redia SAILOR, MD Date: 03/27/2024 Duration: 29.21 mins Aaron history: 68yo M with ams. EEG to evaluate for seizure Level of alertness: Awake AEDs during EEG study: None Technical aspects: This EEG study was done with scalp electrodes positioned according to the 10-20 International system of electrode placement. Electrical activity was reviewed with band pass filter of 1-70Hz , sensitivity of 7 uV/mm, display speed of 66mm/sec with a 60Hz  notched filter applied as appropriate. EEG data were recorded continuously and digitally stored.  Video monitoring was available and reviewed as appropriate. Description: The posterior dominant rhythm consists of 7.5Hz  activity of moderate voltage (25-35 uV) seen predominantly in  posterior head regions, symmetric and reactive to eye opening and eye closing. Hyperventilation and photic stimulation were not performed.   IMPRESSION: This study is within normal limits. No seizures or epileptiform discharges were seen throughout the recording. A normal interictal EEG does not exclude the diagnosis of epilepsy. Arlin Marquez Aaron    Assessment/Plan: Aaron Marquez: The plan is for a Aaron discectomy by Dr. Onetha on Monday.  I have answered all his questions.  LOS: 2 days     Aaron Marquez 03/29/2024, 9:12 AM     Aaron Marquez, male   DOB: [birth year], 68 y.o.   MRN: [id number]

## 2024-03-29 NOTE — Progress Notes (Signed)
 [facility]                                                                               Aaron Marquez, is a 68 y.o. male, DOB - 11/23/1955, FMW:[id number] Admit date - 03/26/2024    Outpatient Primary MD for the patient is [name], FNP  LOS - 2  days    Brief summary    Aaron Marquez is a 68 y.o. male with history of hypertension, gout, back pain who had epidural injection in the low back about 2 weeks ago was brought to the ER after patient was found to be confused with increasing pain in the upper back and also complained of some left arm pain.  MRI T spine shows  Left posterolateral disc protrusion at T11-12 impressing upon the ventral surface of the spinal cord and causing moderate left-sided spinal canal stenosis.  Minimal posterior disc bulging at T3-4, T5-6, T6-7, T7-8, T8-9, T9-10, and T10-11 with very mild central spinal canal stenosis at each level, without impingement upon the spinal cord or nerve roots. MRI brain without contrast is negative for acute pathology.  Neurosurgery consulted.  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 No bowel or bladder incontinence. Patient reports some tremors in the left arm attributed to Parkinson's .  Uncontrolled with oral pain  meds. Added IV morphine  1 to 2 mg in addition to hydrocodone  5 -325 mg every 6 hours.  Adding IV decadron .  NS consulted , plan for thoracic discectomy on Monday by Dr Onetha.   Hypertension No changes in meds.  Continue with amlodipine , metoprolol  and prn hydralazine .    Hyperlipidemia Resume statin.   Mildly elevated troponins No chest pain. Last echo in 01/2024 showed LVEF of 65% to 70%, no regional wall abn. Right ventricular systolic function is normal.  EKG shows sinus rhythm with 1 st degree block, with st t wave abn.   Stage 3b CKD Creatinine at  baseline.   Estimated body mass index is 34.96 kg/m as calculated from the following:   Height as of this encounter: 6' 1 (1.854 m).   Weight as of this encounter: 120.2 kg.  Code Status: full code.  DVT Prophylaxis:  SCDs Start: 03/27/24 0436   Level of Care: Level of care: Telemetry Medical Family Communication: none at bedside. Called wife and spoke to her and updated her.   Disposition Plan:     Remains inpatient appropriate:  pending clinical improvement.   Procedures:  MRI T SPINE  Consultants:   Neurosurgery   Antimicrobials:   Anti-infectives (From admission, onward)    None        Medications  Scheduled Meds:  allopurinol   100 mg Oral Daily   amLODipine   10 mg Oral Daily   atorvastatin   80 mg Oral Daily   carbidopa -levodopa   1 tablet Oral BID   dexamethasone  (DECADRON ) injection  4 mg Intravenous Q6H   irbesartan   75 mg Oral Daily   lidocaine   1 patch Transdermal Q24H   metoprolol  succinate  50 mg Oral Daily   pantoprazole   40 mg Oral Daily   Continuous Infusions: PRN Meds:.acetaminophen , hydrALAZINE , HYDROcodone -acetaminophen , morphine  injection    Subjective:   [name] was seen and examined today. Pain is controlled.   Objective:   Vitals:   03/28/24 2245 03/29/24 0508 03/29/24 0716 03/29/24 1621  BP: 137/81 118/61 132/74 135/70  Pulse: 67 61 (!) 59 70  Resp: 18 18 18 18   Temp: 97.9 F (36.6 C) 97.6 F (36.4 C) 97.6 F (36.4 C) 97.7 F (36.5 C)  TempSrc: Oral  Oral Oral  SpO2: 97% 95% 95% 99%  Weight:      Height:        Intake/Output Summary (Last 24 hours) at 03/29/2024 1703 Last data filed at 03/29/2024 0500 Gross per 24 hour  Intake --  Output 350 ml  Net -350 ml   Filed Weights   03/26/24 1946  Weight: 120.2 kg     Exam General exam: Appears calm and comfortable  Respiratory system: Clear to auscultation. Respiratory effort normal. Cardiovascular system: S1 & S2 heard, RRR. No JVD, Gastrointestinal system:  Abdomen is nondistended, soft and nontender.  Central nervous system: Alert and oriented.  Extremities: Symmetric 5 x 5 power. Skin: No rashes, Psychiatry:  Mood & affect appropriate.       Data Reviewed:  I have personally reviewed following labs and imaging studies   CBC Lab Results  Component Value Date   WBC 3.7 (L) 03/27/2024   RBC 5.80 03/27/2024   HGB 15.3 03/27/2024   HCT 45.4 03/27/2024   MCV 78.3 (L) 03/27/2024   MCH 26.4 03/27/2024   PLT 141 (L) 03/27/2024   MCHC 33.7 03/27/2024   RDW 13.2 03/27/2024   LYMPHSABS 1.3 03/27/2024   MONOABS 0.4 03/27/2024   EOSABS 0.1 03/27/2024   BASOSABS 0.0 03/27/2024     Last metabolic panel Lab Results  Component Value Date   NA 140 03/27/2024   K 3.7 03/27/2024   CL 105 03/27/2024   CO2 23 03/27/2024   BUN 19 03/27/2024   CREATININE 1.73 (H) 03/27/2024   GLUCOSE 98 03/27/2024   GFRNONAA 43 (L) 03/27/2024   GFRAA 45 (L) 06/01/2018   CALCIUM  8.5 (L) 03/27/2024   PROT 6.0 (L) 03/27/2024   ALBUMIN  3.2 (L) 03/27/2024   LABGLOB 2.9 06/21/2023   LABGLOB 2.3 06/21/2023   AGRATIO 1.3 06/21/2023   BILITOT 1.1 03/27/2024   ALKPHOS 60 03/27/2024   AST 15 03/27/2024   ALT 7 03/27/2024   ANIONGAP 12 03/27/2024    CBG (last 3)  Recent Labs    03/26/24 1944  GLUCAP 85      Coagulation Profile: No results for input(s): INR, PROTIME in the last 168 hours.   Radiology Studies: EEG adult Result Date: 03/27/2024 Shelton Arlin KIDD, MD     03/27/2024  5:32 PM Patient Name: Aaron Marquez MRN: [id number] Epilepsy Attending: Arlin KIDD Shelton Referring Physician/Provider: Franky Redia SAILOR, MD Date: 03/27/2024 Duration: 29.21 mins Patient history: 68yo M with ams. EEG to evaluate for seizure Level of alertness: Awake AEDs during EEG study: None Technical aspects: This EEG study was done with scalp electrodes positioned according to the 10-20 International system of electrode placement. Electrical activity was reviewed with  band pass filter of 1-70Hz , sensitivity of 7 uV/mm, display speed of 25mm/sec with a 60Hz  notched filter applied as appropriate. EEG data were recorded continuously and digitally stored.  Video monitoring was available and reviewed as appropriate. Description: The posterior dominant  rhythm consists of 7.5Hz  activity of moderate voltage (25-35 uV) seen predominantly in posterior head regions, symmetric and reactive to eye opening and eye closing. Hyperventilation and photic stimulation were not performed.   IMPRESSION: This study is within normal limits. No seizures or epileptiform discharges were seen throughout the recording. A normal interictal EEG does not exclude the diagnosis of epilepsy. Priyanka O Yadav       Neeraj Housand M.D. [facility] 03/29/2024, 5:03 PM  Available via Epic secure chat 7am-7pm After 7 pm, please refer to night coverage provider listed on amion.

## 2024-03-29 NOTE — Plan of Care (Signed)

## 2024-03-30 DIAGNOSIS — M549 Dorsalgia, unspecified: Secondary | ICD-10-CM | POA: Diagnosis not present

## 2024-03-30 DIAGNOSIS — G252 Other specified forms of tremor: Secondary | ICD-10-CM | POA: Diagnosis not present

## 2024-03-30 DIAGNOSIS — R4182 Altered mental status, unspecified: Secondary | ICD-10-CM | POA: Diagnosis not present

## 2024-03-30 DIAGNOSIS — I10 Essential (primary) hypertension: Secondary | ICD-10-CM | POA: Diagnosis not present

## 2024-03-30 NOTE — Progress Notes (Signed)
 [facility]                                                                               Aaron Marquez, is a 68 y.o. male, DOB - Aug 31, 1955, FMW:[id number] Admit date - 03/26/2024    Outpatient Primary MD for the patient is [name], FNP  LOS - 3  days    Brief summary    Aaron Marquez is a 68 y.o. male with history of hypertension, gout, back pain who had epidural injection in the low back about 2 weeks ago was brought to the ER after patient was found to be confused with increasing pain in the upper back and also complained of some left arm pain.  MRI T spine shows  Left posterolateral disc protrusion at T11-12 impressing upon the ventral surface of the spinal cord and causing moderate left-sided spinal canal stenosis.  Minimal posterior disc bulging at T3-4, T5-6, T6-7, T7-8, T8-9, T9-10, and T10-11 with very mild central spinal canal stenosis at each level, without impingement upon the spinal cord or nerve roots. MRI brain without contrast is negative for acute pathology.  Neurosurgery consulted.  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 No bowel or bladder incontinence. Patient reports some tremors in the left arm attributed to Parkinson's .  Uncontrolled with oral pain  meds. Added IV morphine  1 to 2 mg in addition to hydrocodone  5 -325 mg every 6 hours.  Pain better controlled with IV decadron .  NS consulted , plan for thoracic discectomy on Monday by Dr Onetha. CK levels wnl.    Hypertension Sub optimally controlled. Probably from pain  Continue with amlodipine , metoprolol  and prn hydralazine .    Hyperlipidemia Resume statin.   Mildly elevated troponins No chest pain. Last echo in 01/2024 showed LVEF of 65% to 70%, no regional wall abn. Right ventricular systolic function is normal.  EKG shows sinus rhythm with 1 st  degree block, with st t wave abn. Unchanged from 2024.    Stage 3b CKD Creatinine at baseline.    Mild thrombocytopenia 141,000  Estimated body mass index is 34.96 kg/m as calculated from the following:   Height as of this encounter: 6' 1 (1.854 m).   Weight as of this encounter: 120.2 kg.  Code Status: full code.  DVT Prophylaxis:  SCDs Start: 03/27/24 0436   Level of Care: Level of care: Telemetry Medical Family Communication: none at bedside. Called wife and spoke to her and updated her.   Disposition Plan:     Remains inpatient appropriate:  pending clinical improvement.   Procedures:  MRI T SPINE  Consultants:   Neurosurgery   Antimicrobials:   Anti-infectives (From admission, onward)    None        Medications  Scheduled Meds:  allopurinol   100 mg Oral Daily   amLODipine   10 mg Oral Daily   atorvastatin   80 mg Oral Daily   carbidopa -levodopa   1 tablet Oral BID   dexamethasone  (DECADRON ) injection  4 mg Intravenous Q6H  irbesartan   75 mg Oral Daily   lidocaine   1 patch Transdermal Q24H   metoprolol  succinate  50 mg Oral Daily   pantoprazole   40 mg Oral Daily   Continuous Infusions: PRN Meds:.acetaminophen , hydrALAZINE , HYDROcodone -acetaminophen , morphine  injection    Subjective:   [name] was seen and examined today. No new complaints  Objective:   Vitals:   03/29/24 1621 03/29/24 1938 03/30/24 0613 03/30/24 0756  BP: 135/70 (!) 157/87 (!) 144/91 (!) 155/97  Pulse: 70 75 72 69  Resp: 18     Temp: 97.7 F (36.5 C) 97.7 F (36.5 C) 98.8 F (37.1 C) 97.7 F (36.5 C)  TempSrc: Oral Oral Oral Oral  SpO2: 99% 96% 97% 96%  Weight:      Height:        Intake/Output Summary (Last 24 hours) at 03/30/2024 1245 Last data filed at 03/30/2024 0756 Gross per 24 hour  Intake 240 ml  Output 150 ml  Net 90 ml   Filed Weights   03/26/24 1946  Weight: 120.2 kg     Exam General exam: Appears calm and comfortable  Respiratory system:  Clear to auscultation. Respiratory effort normal. Cardiovascular system: S1 & S2 heard, RRR. No JVD,  Gastrointestinal system: Abdomen is nondistended, soft and nontender.  Central nervous system: Alert and oriented.  Extremities: Symmetric 5 x 5 power. Skin: No rashes, Psychiatry:  Mood & affect appropriate.        Data Reviewed:  I have personally reviewed following labs and imaging studies   CBC Lab Results  Component Value Date   WBC 3.7 (L) 03/27/2024   RBC 5.80 03/27/2024   HGB 15.3 03/27/2024   HCT 45.4 03/27/2024   MCV 78.3 (L) 03/27/2024   MCH 26.4 03/27/2024   PLT 141 (L) 03/27/2024   MCHC 33.7 03/27/2024   RDW 13.2 03/27/2024   LYMPHSABS 1.3 03/27/2024   MONOABS 0.4 03/27/2024   EOSABS 0.1 03/27/2024   BASOSABS 0.0 03/27/2024     Last metabolic panel Lab Results  Component Value Date   NA 140 03/27/2024   K 3.7 03/27/2024   CL 105 03/27/2024   CO2 23 03/27/2024   BUN 19 03/27/2024   CREATININE 1.73 (H) 03/27/2024   GLUCOSE 98 03/27/2024   GFRNONAA 43 (L) 03/27/2024   GFRAA 45 (L) 06/01/2018   CALCIUM  8.5 (L) 03/27/2024   PROT 6.0 (L) 03/27/2024   ALBUMIN  3.2 (L) 03/27/2024   LABGLOB 2.9 06/21/2023   LABGLOB 2.3 06/21/2023   AGRATIO 1.3 06/21/2023   BILITOT 1.1 03/27/2024   ALKPHOS 60 03/27/2024   AST 15 03/27/2024   ALT 7 03/27/2024   ANIONGAP 12 03/27/2024    CBG (last 3)  No results for input(s): GLUCAP in the last 72 hours.     Coagulation Profile: No results for input(s): INR, PROTIME in the last 168 hours.   Radiology Studies: No results found.      Elgie Butter M.D. [facility] 03/30/2024, 12:45 PM  Available via Epic secure chat 7am-7pm After 7 pm, please refer to night coverage provider listed on amion.

## 2024-03-30 NOTE — Plan of Care (Signed)

## 2024-03-31 ENCOUNTER — Inpatient Hospital Stay (HOSPITAL_COMMUNITY)

## 2024-03-31 ENCOUNTER — Inpatient Hospital Stay (HOSPITAL_COMMUNITY): Admitting: Anesthesiology

## 2024-03-31 ENCOUNTER — Other Ambulatory Visit: Payer: Self-pay

## 2024-03-31 ENCOUNTER — Encounter (HOSPITAL_COMMUNITY): Payer: Self-pay | Admitting: Internal Medicine

## 2024-03-31 ENCOUNTER — Inpatient Hospital Stay (HOSPITAL_COMMUNITY)
Admission: EM | Disposition: A | Discharge status: Skilled Nursing Facility | Payer: Self-pay | Source: Home / Self Care | Attending: Internal Medicine

## 2024-03-31 DIAGNOSIS — I10 Essential (primary) hypertension: Secondary | ICD-10-CM | POA: Diagnosis not present

## 2024-03-31 DIAGNOSIS — M5104 Intervertebral disc disorders with myelopathy, thoracic region: Secondary | ICD-10-CM | POA: Diagnosis present

## 2024-03-31 DIAGNOSIS — M549 Dorsalgia, unspecified: Secondary | ICD-10-CM | POA: Diagnosis not present

## 2024-03-31 DIAGNOSIS — G252 Other specified forms of tremor: Secondary | ICD-10-CM | POA: Diagnosis not present

## 2024-03-31 DIAGNOSIS — R4182 Altered mental status, unspecified: Secondary | ICD-10-CM | POA: Diagnosis not present

## 2024-03-31 DIAGNOSIS — M4804 Spinal stenosis, thoracic region: Secondary | ICD-10-CM | POA: Diagnosis not present

## 2024-03-31 HISTORY — PX: DECOMPRESSIVE LUMBAR LAMINECTOMY LEVEL 1: SHX5791

## 2024-03-31 LAB — SURGICAL PCR SCREEN
MRSA, PCR: NEGATIVE
Staphylococcus aureus: NEGATIVE

## 2024-03-31 SURGERY — DECOMPRESSIVE LUMBAR LAMINECTOMY LEVEL 1
Anesthesia: General | Site: Spine Thoracic

## 2024-03-31 MED ORDER — ATORVASTATIN CALCIUM 40 MG PO TABS: 40.0000 mg | ORAL_TABLET | Freq: Every day | ORAL | Status: DC

## 2024-03-31 MED ORDER — CHLORHEXIDINE GLUCONATE CLOTH 2 % EX PADS: 6.0000 | MEDICATED_PAD | Freq: Once | CUTANEOUS | Status: DC

## 2024-03-31 MED ORDER — MIDAZOLAM HCL 2 MG/2ML IJ SOLN
INTRAMUSCULAR | Status: AC
  Filled 2024-03-31: qty 2

## 2024-03-31 MED ORDER — METHOCARBAMOL 500 MG PO TABS
500.0000 mg | ORAL_TABLET | Freq: Three times a day (TID) | ORAL | Status: DC | PRN
  Administered 2024-03-31 – 2024-04-01 (×3): 500 mg via ORAL
  Filled 2024-03-31 (×4): qty 1

## 2024-03-31 MED ORDER — CHLORHEXIDINE GLUCONATE 0.12 % MT SOLN: 15.0000 mL | Freq: Once | OROMUCOSAL | Status: AC

## 2024-03-31 MED ORDER — FENTANYL CITRATE (PF) 250 MCG/5ML IJ SOLN
INTRAMUSCULAR | Status: AC
  Filled 2024-03-31: qty 5

## 2024-03-31 MED ORDER — FENTANYL CITRATE (PF) 250 MCG/5ML IJ SOLN
INTRAMUSCULAR | Status: DC | PRN
  Administered 2024-03-31: 50 ug via INTRAVENOUS
  Administered 2024-03-31: 100 ug via INTRAVENOUS

## 2024-03-31 MED ORDER — OXYCODONE HCL 5 MG PO TABS: 5.0000 mg | ORAL_TABLET | Freq: Once | ORAL | Status: DC | PRN

## 2024-03-31 MED ORDER — DEXAMETHASONE SODIUM PHOSPHATE 10 MG/ML IJ SOLN
INTRAMUSCULAR | Status: DC | PRN
  Administered 2024-03-31: 10 mg via INTRAVENOUS

## 2024-03-31 MED ORDER — THROMBIN 5000 UNITS EX SOLR
OROMUCOSAL | Status: DC | PRN
  Administered 2024-03-31: 5 mL via TOPICAL

## 2024-03-31 MED ORDER — ALBUMIN HUMAN 5 % IV SOLN: INTRAVENOUS | Status: DC | PRN

## 2024-03-31 MED ORDER — LIDOCAINE-EPINEPHRINE 1 %-1:100000 IJ SOLN
INTRAMUSCULAR | Status: DC | PRN
  Administered 2024-03-31: 10 mL

## 2024-03-31 MED ORDER — BUPIVACAINE HCL (PF) 0.25 % IJ SOLN
INTRAMUSCULAR | Status: DC | PRN
  Administered 2024-03-31: 10 mL

## 2024-03-31 MED ORDER — ONDANSETRON HCL 4 MG/2ML IJ SOLN: 4.0000 mg | Freq: Four times a day (QID) | INTRAMUSCULAR | Status: DC | PRN

## 2024-03-31 MED ORDER — ORAL CARE MOUTH RINSE: 15.0000 mL | Freq: Once | OROMUCOSAL | Status: AC

## 2024-03-31 MED ORDER — THROMBIN (RECOMBINANT) 5000 UNITS EX SOLR
CUTANEOUS | Status: DC | PRN
  Administered 2024-03-31: 1 via TOPICAL

## 2024-03-31 MED ORDER — PROPOFOL 10 MG/ML IV BOLUS
INTRAVENOUS | Status: DC | PRN
  Administered 2024-03-31: 170 mg via INTRAVENOUS

## 2024-03-31 MED ORDER — CEFAZOLIN SODIUM-DEXTROSE 3-4 GM/150ML-% IV SOLN
3.0000 g | INTRAVENOUS | Status: AC
  Administered 2024-03-31: 3 g via INTRAVENOUS

## 2024-03-31 MED ORDER — MIDAZOLAM HCL 2 MG/2ML IJ SOLN
INTRAMUSCULAR | Status: DC | PRN
  Administered 2024-03-31: 2 mg via INTRAVENOUS

## 2024-03-31 MED ORDER — OXYCODONE HCL 5 MG/5ML PO SOLN: 5.0000 mg | Freq: Once | ORAL | Status: DC | PRN

## 2024-03-31 MED ORDER — ROCURONIUM BROMIDE 10 MG/ML (PF) SYRINGE
PREFILLED_SYRINGE | INTRAVENOUS | Status: DC | PRN
  Administered 2024-03-31: 40 mg via INTRAVENOUS
  Administered 2024-03-31: 100 mg via INTRAVENOUS

## 2024-03-31 MED ORDER — BUPIVACAINE HCL (PF) 0.25 % IJ SOLN
INTRAMUSCULAR | Status: AC
  Filled 2024-03-31: qty 30

## 2024-03-31 MED ORDER — SUGAMMADEX SODIUM 200 MG/2ML IV SOLN
INTRAVENOUS | Status: DC | PRN
  Administered 2024-03-31: 200 mg via INTRAVENOUS

## 2024-03-31 MED ORDER — EPHEDRINE SULFATE-NACL 50-0.9 MG/10ML-% IV SOSY
PREFILLED_SYRINGE | INTRAVENOUS | Status: DC | PRN
  Administered 2024-03-31: 10 mg via INTRAVENOUS

## 2024-03-31 MED ORDER — LIDOCAINE 2% (20 MG/ML) 5 ML SYRINGE
INTRAMUSCULAR | Status: DC | PRN
  Administered 2024-03-31: 100 mg via INTRAVENOUS

## 2024-03-31 MED ORDER — ONDANSETRON HCL 4 MG/2ML IJ SOLN
INTRAMUSCULAR | Status: DC | PRN
  Administered 2024-03-31: 4 mg via INTRAVENOUS

## 2024-03-31 MED ORDER — SODIUM CHLORIDE 0.9 % IV SOLN: INTRAVENOUS | Status: DC

## 2024-03-31 MED ORDER — PROPOFOL 10 MG/ML IV BOLUS
INTRAVENOUS | Status: AC
  Filled 2024-03-31: qty 20

## 2024-03-31 MED ORDER — 0.9 % SODIUM CHLORIDE (POUR BTL) OPTIME
TOPICAL | Status: DC | PRN
  Administered 2024-03-31: 1000 mL

## 2024-03-31 MED ORDER — LIDOCAINE-EPINEPHRINE 1 %-1:100000 IJ SOLN
INTRAMUSCULAR | Status: AC
  Filled 2024-03-31: qty 1

## 2024-03-31 MED ORDER — CEFAZOLIN SODIUM-DEXTROSE 2-4 GM/100ML-% IV SOLN
2.0000 g | Freq: Four times a day (QID) | INTRAVENOUS | Status: AC
  Administered 2024-03-31 – 2024-04-01 (×2): 2 g via INTRAVENOUS
  Filled 2024-03-31 (×2): qty 100

## 2024-03-31 MED ORDER — CHLORHEXIDINE GLUCONATE 0.12 % MT SOLN
OROMUCOSAL | Status: AC
  Administered 2024-03-31: 15 mL via OROMUCOSAL
  Filled 2024-03-31: qty 15

## 2024-03-31 MED ORDER — THROMBIN 5000 UNITS EX KIT
PACK | CUTANEOUS | Status: AC
Start: 2024-03-31 — End: 2024-03-31
  Filled 2024-03-31: qty 2

## 2024-03-31 MED ORDER — CEFAZOLIN SODIUM-DEXTROSE 2-4 GM/100ML-% IV SOLN
INTRAVENOUS | Status: AC
Start: 2024-03-31 — End: 2024-03-31
  Filled 2024-03-31: qty 100

## 2024-03-31 MED ORDER — FENTANYL CITRATE (PF) 100 MCG/2ML IJ SOLN: 25.0000 ug | INTRAMUSCULAR | Status: DC | PRN

## 2024-03-31 MED ORDER — THROMBIN 5000 UNITS EX KIT
PACK | CUTANEOUS | Status: AC
  Filled 2024-03-31: qty 1

## 2024-03-31 MED ORDER — LACTATED RINGERS IV SOLN: INTRAVENOUS | Status: DC

## 2024-03-31 SURGICAL SUPPLY — 47 items
BAG COUNTER SPONGE SURGICOUNT (BAG) ×1 IMPLANT
BAND RUBBER #18 3X1/16 STRL (MISCELLANEOUS) ×2 IMPLANT
BENZOIN TINCTURE PRP APPL 2/3 (GAUZE/BANDAGES/DRESSINGS) ×1 IMPLANT
BIT DRILL NEURO 2X3.1 SFT TUCH (MISCELLANEOUS) IMPLANT
BLADE CLIPPER SURG (BLADE) IMPLANT
BLADE SURG 11 STRL SS (BLADE) ×1 IMPLANT
BUR MATCHSTICK NEURO 3.0 LAGG (BURR) ×1 IMPLANT
BUR PRECISION FLUTE 6.0 (BURR) ×1 IMPLANT
CANISTER SUCTION 3000ML PPV (SUCTIONS) ×1 IMPLANT
DERMABOND ADVANCED .7 DNX12 (GAUZE/BANDAGES/DRESSINGS) ×1 IMPLANT
DRAPE HALF SHEET 40X57 (DRAPES) IMPLANT
DRAPE LAPAROTOMY 100X72X124 (DRAPES) ×1 IMPLANT
DRAPE MICROSCOPE SLANT 54X150 (MISCELLANEOUS) ×1 IMPLANT
DRAPE POUCH INSTRU U-SHP 10X18 (DRAPES) ×1 IMPLANT
DRAPE SURG 17X23 STRL (DRAPES) ×1 IMPLANT
DRSG OPSITE POSTOP 4X6 (GAUZE/BANDAGES/DRESSINGS) IMPLANT
DURAPREP 26ML APPLICATOR (WOUND CARE) ×1 IMPLANT
ELECTRODE REM PT RTRN 9FT ADLT (ELECTROSURGICAL) ×1 IMPLANT
GAUZE 4X4 16PLY ~~LOC~~+RFID DBL (SPONGE) IMPLANT
GAUZE SPONGE 4X4 12PLY STRL (GAUZE/BANDAGES/DRESSINGS) ×1 IMPLANT
GLOVE BIO SURGEON STRL SZ7 (GLOVE) IMPLANT
GLOVE BIO SURGEON STRL SZ8 (GLOVE) ×1 IMPLANT
GLOVE BIOGEL PI IND STRL 7.0 (GLOVE) IMPLANT
GLOVE ECLIPSE 7.5 STRL STRAW (GLOVE) IMPLANT
GLOVE EXAM NITRILE XL STR (GLOVE) IMPLANT
GLOVE INDICATOR 8.5 STRL (GLOVE) ×2 IMPLANT
GOWN STRL REUS W/ TWL LRG LVL3 (GOWN DISPOSABLE) ×1 IMPLANT
GOWN STRL REUS W/ TWL XL LVL3 (GOWN DISPOSABLE) ×2 IMPLANT
GOWN STRL REUS W/TWL 2XL LVL3 (GOWN DISPOSABLE) IMPLANT
HEMOSTAT POWDER KIT SURGIFOAM (HEMOSTASIS) IMPLANT
KIT BASIN OR (CUSTOM PROCEDURE TRAY) ×1 IMPLANT
KIT TURNOVER KIT B (KITS) ×1 IMPLANT
NDL HYPO 22X1.5 SAFETY MO (MISCELLANEOUS) ×1 IMPLANT
NDL SPNL 22GX3.5 QUINCKE BK (NEEDLE) ×1 IMPLANT
NEEDLE HYPO 22X1.5 SAFETY MO (MISCELLANEOUS) ×1 IMPLANT
NEEDLE SPNL 22GX3.5 QUINCKE BK (NEEDLE) ×1 IMPLANT
NS IRRIG 1000ML POUR BTL (IV SOLUTION) ×1 IMPLANT
PACK LAMINECTOMY NEURO (CUSTOM PROCEDURE TRAY) ×1 IMPLANT
SPIKE FLUID TRANSFER (MISCELLANEOUS) ×1 IMPLANT
SPONGE SURGIFOAM ABS GEL SZ50 (HEMOSTASIS) ×1 IMPLANT
STRIP CLOSURE SKIN 1/2X4 (GAUZE/BANDAGES/DRESSINGS) ×1 IMPLANT
SUT VIC AB 0 CT1 18XCR BRD8 (SUTURE) ×1 IMPLANT
SUT VIC AB 2-0 CT1 18 (SUTURE) ×1 IMPLANT
SUT VIC AB 4-0 PS2 18 (SUTURE) ×1 IMPLANT
TOWEL GREEN STERILE (TOWEL DISPOSABLE) ×1 IMPLANT
TOWEL GREEN STERILE FF (TOWEL DISPOSABLE) ×1 IMPLANT
WATER STERILE IRR 1000ML POUR (IV SOLUTION) ×1 IMPLANT

## 2024-03-31 NOTE — Progress Notes (Signed)
 [facility]                                                                               Aaron Marquez, is a 68 y.o. male, DOB - 08-Feb-1956, FMW:[id number] Admit date - 03/26/2024    Outpatient Primary MD for the patient is [name], FNP  LOS - 4  days    Brief summary    Aaron Marquez is a 68 y.o. male with history of hypertension, gout, back pain who had epidural injection in the low back about 2 weeks ago was brought to the ER after patient was found to be confused with increasing pain in the upper back and also complained of some left arm pain.  MRI T spine shows  Left posterolateral disc protrusion at T11-12 impressing upon the ventral surface of the spinal cord and causing moderate left-sided spinal canal stenosis.  Minimal posterior disc bulging at T3-4, T5-6, T6-7, T7-8, T8-9, T9-10, and T10-11 with very mild central spinal canal stenosis at each level, without impingement upon the spinal cord or nerve roots. MRI brain without contrast is negative for acute pathology.  Neurosurgery consulted.  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 No bowel or bladder incontinence. Patient reports some tremors in the left arm attributed to Parkinson's .  Uncontrolled with oral pain  meds. Added IV morphine  1 to 2 mg in addition to hydrocodone  5 -325 mg every 6 hours.  Pain better controlled with IV decadron .  NS consulted , plan for thoracic laminectomy T11-12 and microdiscectomy.  CK levels wnl.    Hypertension Well controlled this morning.  Continue with amlodipine , metoprolol  and prn hydralazine .    Hyperlipidemia Resume statin.   Mildly elevated troponins No chest pain. Last echo in 01/2024 showed LVEF of 65% to 70%, no regional wall abn. Right ventricular systolic function is normal.  EKG shows sinus rhythm with 1 st degree  block, with st t wave abn. Unchanged from 2024.    Stage 3b CKD Creatinine at baseline.    Mild thrombocytopenia 141,000  Estimated body mass index is 34.96 kg/m as calculated from the following:   Height as of this encounter: 6' 1 (1.854 m).   Weight as of this encounter: 120.2 kg.  Code Status: full code.  DVT Prophylaxis:  SCD's Start: 03/31/24 1219 SCDs Start: 03/27/24 0436   Level of Care: Level of care: Telemetry Medical Family Communication: none at bedside. Called wife and spoke to her and updated her.   Disposition Plan:     Remains inpatient appropriate:  pending OR today  Procedures:  MRI T SPINE thoracic laminectomy T11-12 and microdiscectomy.   Consultants:   Neurosurgery   Antimicrobials:   Anti-infectives (From admission, onward)    Start     Dose/Rate Route Frequency Ordered Stop   04/01/24 0600  ceFAZolin  (ANCEF ) IVPB 3g/150 mL premix        3 g 300 mL/hr over 30 Minutes Intravenous On call to O.R. 03/31/24 1218 04/02/24 0559   03/31/24 1148  ceFAZolin  (ANCEF ) 2-4 GM/100ML-% IVPB       Note to Pharmacy: Larina Mays A: cabinet override      03/31/24 1148 03/31/24 2359        Medications  Scheduled Meds:  [MAR Hold] allopurinol   100 mg Oral Daily   [MAR Hold] amLODipine   10 mg Oral Daily   [MAR Hold] atorvastatin   80 mg Oral Daily   [MAR Hold] carbidopa -levodopa   1 tablet Oral BID   [START ON 04/01/2024]  ceFAZolin  (ANCEF ) IV  3 g Intravenous On Call to OR   Chlorhexidine  Gluconate Cloth  6 each Topical Once   And   Chlorhexidine  Gluconate Cloth  6 each Topical Once   [MAR Hold] dexamethasone  (DECADRON ) injection  4 mg Intravenous Q6H   [MAR Hold] irbesartan   75 mg Oral Daily   [MAR Hold] lidocaine   1 patch Transdermal Q24H   [MAR Hold] metoprolol  succinate  50 mg Oral Daily   [MAR Hold] pantoprazole   40 mg Oral Daily   Continuous Infusions:  sodium chloride      ceFAZolin      lactated ringers      PRN Meds:.[MAR Hold]  acetaminophen , ceFAZolin , [MAR Hold] hydrALAZINE , [MAR Hold] HYDROcodone -acetaminophen , [MAR Hold]  morphine  injection    Subjective:   Aaron Marquez was seen and examined today.  No new complaints. No chest pain or sob, nausea or vomiting.   Objective:   Vitals:   03/30/24 1615 03/31/24 0500 03/31/24 0730 03/31/24 1212  BP: (!) 140/81 (!) 156/93 131/83 138/87  Pulse: 73 65 (!) 58 68  Resp: 18 18 18 18   Temp: 97.9 F (36.6 C) 98 F (36.7 C)  97.8 F (36.6 C)  TempSrc: Oral Oral Oral Oral  SpO2: 97% 96% 97% 98%  Weight:    120.2 kg  Height:    6' 1 (1.854 m)    Intake/Output Summary (Last 24 hours) at 03/31/2024 1454 Last data filed at 03/31/2024 0900 Gross per 24 hour  Intake 240 ml  Output 0 ml  Net 240 ml   Filed Weights   03/26/24 1946 03/31/24 1212  Weight: 120.2 kg 120.2 kg     Exam General exam: Appears calm and comfortable  Respiratory system: Clear to auscultation. Respiratory effort normal. Cardiovascular system: S1 & S2 heard, RRR. Gastrointestinal system: Abdomen is nondistended, soft and nontender.  Central nervous system: Alert and oriented.  Extremities: Symmetric 5 x 5 power. Skin: No rashes,  Psychiatry: . Mood & affect appropriate.         Data Reviewed:  I have personally reviewed following labs and imaging studies   CBC Lab Results  Component Value Date   WBC 3.7 (L) 03/27/2024   RBC 5.80 03/27/2024   HGB 15.3 03/27/2024   HCT 45.4 03/27/2024   MCV 78.3 (L) 03/27/2024   MCH 26.4 03/27/2024   PLT 141 (L) 03/27/2024   MCHC 33.7 03/27/2024   RDW 13.2 03/27/2024   LYMPHSABS 1.3 03/27/2024   MONOABS 0.4 03/27/2024   EOSABS 0.1 03/27/2024   BASOSABS 0.0 03/27/2024     Last metabolic panel Lab Results  Component Value Date   NA 140 03/27/2024   K 3.7 03/27/2024   CL 105 03/27/2024   CO2 23 03/27/2024   BUN 19 03/27/2024   CREATININE 1.73 (H) 03/27/2024   GLUCOSE 98 03/27/2024   GFRNONAA 43 (L) 03/27/2024   GFRAA 45 (L)  06/01/2018   CALCIUM  8.5 (L) 03/27/2024   PROT 6.0 (L) 03/27/2024   ALBUMIN  3.2 (L) 03/27/2024  LABGLOB 2.9 06/21/2023   LABGLOB 2.3 06/21/2023   AGRATIO 1.3 06/21/2023   BILITOT 1.1 03/27/2024   ALKPHOS 60 03/27/2024   AST 15 03/27/2024   ALT 7 03/27/2024   ANIONGAP 12 03/27/2024    CBG (last 3)  No results for input(s): GLUCAP in the last 72 hours.     Coagulation Profile: No results for input(s): INR, PROTIME in the last 168 hours.   Radiology Studies: No results found.      Elgie Butter M.D. [facility] 03/31/2024, 2:54 PM  Available via Epic secure chat 7am-7pm After 7 pm, please refer to night coverage provider listed on amion.

## 2024-03-31 NOTE — TOC Progression Note (Signed)
 Transition of Care [facility]) - Progression Note    Patient Details  Name: Antoni Stefan MRN: [id number] Date of Birth: Oct 01, 1955  [facility] CM/SW Contact  Lendia Dais, [place] Phone Number: 03/31/2024, 2:41 PM  Clinical Narrative:   Patient has accepted a bed offer at [facility].  Insurance shara was approved, ID is [id number] from 04/01/2024-04/03/2024.  CSW will continue to follow.                       Expected Discharge Plan and Services                                               Social Drivers of Health (SDOH) Interventions SDOH Screenings   Food Insecurity: No Food Insecurity (03/27/2024)  Housing: Low Risk  (03/27/2024)  Transportation Needs: No Transportation Needs (03/27/2024)  Utilities: Not At Risk (03/27/2024)  Financial Resource Strain: Low Risk  (12/26/2023)   Received from [facility]  Physical Activity: Unknown (12/26/2023)   Received from [facility]  Social Connections: Patient Declined (03/27/2024)  Stress: No Stress Concern Present (12/26/2023)   Received from [facility]  Tobacco Use: Low Risk  (03/31/2024)    Readmission Risk Interventions    03/27/2024    3:11 PM  Readmission Risk Prevention Plan  Transportation Screening Complete  PCP or Specialist Appt within 5-7 Days Complete  Home Care Screening Complete  Medication Review (RN CM) Referral to Pharmacy

## 2024-03-31 NOTE — Anesthesia Preprocedure Evaluation (Signed)
 Anesthesia Evaluation  Patient identified by MRN, date of birth, ID band Patient awake    Reviewed: Allergy & Precautions, H&P , NPO status , Patient's Chart, lab work & pertinent test results  Airway Mallampati: II   Neck ROM: full    Dental   Pulmonary neg pulmonary ROS   breath sounds clear to auscultation       Cardiovascular hypertension,  Rhythm:regular Rate:Normal     Neuro/Psych    GI/Hepatic ,GERD  ,,  Endo/Other    Renal/GU Renal InsufficiencyRenal disease     Musculoskeletal   Abdominal   Peds  Hematology   Anesthesia Other Findings   Reproductive/Obstetrics                              Anesthesia Physical Anesthesia Plan  ASA: 2  Anesthesia Plan: General   Post-op Pain Management:    Induction: Intravenous  PONV Risk Score and Plan: 2 and Ondansetron , Dexamethasone , Midazolam  and Treatment may vary due to age or medical condition  Airway Management Planned: Oral ETT  Additional Equipment:   Intra-op Plan:   Post-operative Plan: Extubation in OR  Informed Consent: I have reviewed the patients History and Physical, chart, labs and discussed the procedure including the risks, benefits and alternatives for the proposed anesthesia with the patient or authorized representative who has indicated his/her understanding and acceptance.     Dental advisory given  Plan Discussed with: CRNA, Anesthesiologist and Surgeon  Anesthesia Plan Comments:         Anesthesia Quick Evaluation

## 2024-03-31 NOTE — Progress Notes (Signed)
 Occupational Therapy Treatment Patient Details Name: Aaron Marquez MRN: [id number] DOB: 07-05-1956 Today's Date: 03/31/2024   History of present illness Pt is a 68 y/o M admitted on 03/26/24 after presenting with c/o confusion, increasing upper back pain & LUE pain.  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 Scheduled to undergo discectomy 8/25 with Dr. Onetha. PMH: HTN, gout, lumbar hernia, GERD, back pain   OT comments  Pt with impending back surgery. Educated in log roll technique for bed mobility and no bending, lifting or twisting for comfort and in preparation for post surgery. Moderate assistance for bed mobility. Supervision for 3 grooming activities seated at EOB. Pt declined OOB to chair or blinds being raised. Pt anticipates needing rehab following surgery, wife works from home. Patient will benefit from continued inpatient follow up therapy, <3 hours/day.      If plan is discharge home, recommend the following:  A little help with walking and/or transfers;A lot of help with bathing/dressing/bathroom;Assistance with cooking/housework;Direct supervision/assist for medications management;Direct supervision/assist for financial management;Assist for transportation;Help with stairs or ramp for entrance   Equipment Recommendations  BSC/3in1    Recommendations for Other Services      Precautions / Restrictions Precautions Precautions: Fall;Back Precaution/Restrictions Comments: reinforced no bending, lifting or twisting Restrictions Weight Bearing Restrictions Per Provider Order: No       Mobility Bed Mobility Overal bed mobility: Needs Assistance Bed Mobility: Rolling, Sidelying to Sit, Sit to Sidelying Rolling: Mod assist, Used rails Sidelying to sit: Mod assist     Sit to sidelying: Mod assist, Used rails General bed mobility comments: increased time, assist with trunk and BLE    Transfers                    General transfer comment: declined OOB to chair     Balance Overall balance assessment: Needs assistance   Sitting balance-Leahy Scale: Good                                     ADL either performed or assessed with clinical judgement   ADL Overall ADL's : Needs assistance/impaired     Grooming: Wash/dry hands;Wash/dry face;Sitting;Oral care;Supervision/safety Grooming Details (indicate cue type and reason): verbally educated on two cup method for oral care             Lower Body Dressing: Total assistance;Bed level                      Extremity/Trunk Assessment              Vision       Perception     Praxis     Communication Communication Communication: No apparent difficulties   Cognition Arousal: Alert Behavior During Therapy: WFL for tasks assessed/performed, Flat affect Cognition: Cognition impaired     Awareness: Intellectual awareness intact, Online awareness impaired Memory impairment (select all impairments): Short-term memory, Working Biochemist, clinical functioning impairment (select all impairments): Initiation, Problem solving OT - Cognition Comments: Pt with improved cognition from evaluation, recalling events of weekend and aware his back sx is today.                 Following commands: Impaired Following commands impaired: Follows one step commands with increased time      Cueing   Cueing Techniques: Verbal cues,  Tactile cues, Visual cues  Exercises      Shoulder Instructions       General Comments      Pertinent Vitals/ Pain       Pain Assessment Pain Assessment: Faces Faces Pain Scale: Hurts little more Pain Location: low back, radiating to RLE Pain Descriptors / Indicators: Discomfort, Numbness, Tingling, Radiating Pain Intervention(s): Monitored during session, Premedicated before session, Repositioned  Home Living                                          Prior  Functioning/Environment              Frequency  Min 2X/week        Progress Toward Goals  OT Goals(current goals can now be found in the care plan section)  Progress towards OT goals: Progressing toward goals  Acute Rehab OT Goals OT Goal Formulation: With patient Time For Goal Achievement: 04/11/24 Potential to Achieve Goals: Good  Plan      Co-evaluation                 AM-PAC OT 6 Clicks Daily Activity     Outcome Measure   Help from another person eating meals?: None Help from another person taking care of personal grooming?: A Little Help from another person toileting, which includes using toliet, bedpan, or urinal?: Total Help from another person bathing (including washing, rinsing, drying)?: A Lot Help from another person to put on and taking off regular upper body clothing?: A Little Help from another person to put on and taking off regular lower body clothing?: Total 6 Click Score: 14    End of Session    OT Visit Diagnosis: Unsteadiness on feet (R26.81);Other abnormalities of gait and mobility (R26.89);Pain;Other symptoms and signs involving cognitive function;Muscle weakness (generalized) (M62.81)   Activity Tolerance Patient tolerated treatment well   Patient Left in bed;with call bell/phone within reach;with bed alarm set   Nurse Communication          Time: 9167-9154 OT Time Calculation (min): 13 min  Charges: OT General Charges $OT Visit: 1 Visit OT Treatments $Self Care/Home Management : 8-22 mins  Mliss HERO, OTR/L Acute Rehabilitation Services Office: [phone]   Kennth Mliss Helling 03/31/2024, 8:51 AM

## 2024-03-31 NOTE — Anesthesia Procedure Notes (Signed)
 Procedure Name: Intubation Date/Time: 03/31/2024 2:50 PM  Performed by: Lockie Flesher, CRNAPre-anesthesia Checklist: Patient identified, Emergency Drugs available, Suction available and Patient being monitored Patient Re-evaluated:Patient Re-evaluated prior to induction Oxygen Delivery Method: Circle System Utilized Preoxygenation: Pre-oxygenation with 100% oxygen Induction Type: IV induction Ventilation: Mask ventilation without difficulty Laryngoscope Size: Mac and 3 Grade View: Grade I Tube type: Oral Tube size: 7.0 mm Number of attempts: 1 Airway Equipment and Method: Stylet and Oral airway Placement Confirmation: ETT inserted through vocal cords under direct vision, positive ETCO2 and breath sounds checked- equal and bilateral Secured at: 22 cm Tube secured with: Tape Dental Injury: Teeth and Oropharynx as per pre-operative assessment

## 2024-03-31 NOTE — Transfer of Care (Signed)
 Immediate Anesthesia Transfer of Care Note  Patient: Aaron Marquez  Procedure(s) Performed: DECOMPRESSIVE THORACIC LAMINECTOMY THORACIC ELEVEN-TWELVE (Spine Thoracic)  Patient Location: PACU  Anesthesia Type:General  Level of Consciousness: awake, alert , and oriented  Airway & Oxygen Therapy: Patient Spontanous Breathing and Patient connected to nasal cannula oxygen  Post-op Assessment: Report given to RN and Post -op Vital signs reviewed and stable  Post vital signs: Reviewed and stable  Last Vitals:  Vitals Value Taken Time  BP 127/82 03/31/24 18:16  Temp 36.4 C 03/31/24 17:45  Pulse 75 03/31/24 18:16  Resp 18 03/31/24 18:16  SpO2 99 % 03/31/24 18:16    Last Pain:  Vitals:   03/31/24 1745  TempSrc:   PainSc: 0-No pain      Patients Stated Pain Goal: 0 (03/27/24 2105)  Complications: No notable events documented.

## 2024-03-31 NOTE — Progress Notes (Signed)
 Subjective: Patient reports numbness tingling weakness in his legs  Objective: Vital signs in last 24 hours: Temp:  [97.8 F (36.6 C)-98 F (36.7 C)] 97.8 F (36.6 C) (08/25 1212) Pulse Rate:  [58-73] 68 (08/25 1212) Resp:  [18] 18 (08/25 1212) BP: (131-156)/(81-93) 138/87 (08/25 1212) SpO2:  [96 %-98 %] 98 % (08/25 1212) Weight:  [120.2 kg] 120.2 kg (08/25 1212)  Intake/Output from previous day: 08/24 0701 - 08/25 0700 In: 480 [P.O.:480] Out: 150 [Urine:150] Intake/Output this shift: No intake/output data recorded.  Strength is 5 out of 5 iliopsoas, quads, hamstrings, gastrocs, and tibialis, EHL.  Lab Results: No results for input(s): WBC, HGB, HCT, PLT in the last 72 hours. BMET No results for input(s): NA, K, CL, CO2, GLUCOSE, BUN, CREATININE, CALCIUM  in the last 72 hours.  Studies/Results: No results found.  Assessment/Plan: 68 years old with longstanding issues with his back presents with severe back pain but numbness tingling weakness in his legs.  Workup revealed a large disc herniation at T11-12 causing severe cord compression.  Due to patient's progression of clinical syndrome imaging findings failed conservative treatment I recommended thoracic laminectomy T11-12 and microdiscectomy.  I extensively over the risks and benefits of the operation with him as well as perioperative course expectations of outcome and alternatives to surgery he understood and agreed to proceed forward.  LOS: 4 days     Aaron Marquez 03/31/2024, 2:17 PM

## 2024-03-31 NOTE — Op Note (Signed)
 Preoperative diagnosis: Herniated nucleus pulposus T11-12.  Postoperative diagnosis: Same.  Procedure: Left-sided thoracic 11-12 laminectomy with transpedicular discectomy with microscopic discectomy and microscopic foraminotomies of the T11-T12 nerve roots from the left.  Surgeon: Arley helling.  Assistant: Suzen Click.  Anesthesia: General  EBL: Minimal.  HPI: 68 year old man presented with numbness in his legs and weakness back pain workup revealed large disc herniation T11-12 with severe cord compression.  Due to the patient's progression of clinical syndrome imaging findings and failed conservative treatment I recommended thoracic laminectomy and discectomy at T11-12.  I extensively went over the risks and benefits of the operation with the patient as well as perioperative course expectations of outcome and alternatives to surgery and he understood and agreed to proceed forward.  Operative procedure: Patient was brought into the OR was Duson general anesthesia positioned prone the Wilson frame her back was prepped and draped in routine sterile fashion preoperative x-ray localized the appropriate level so after infiltration of 10 cc lidocaine  with epi midline incision was made and Bovie electrocautery was used to take down the subcutaneous tissue and subperiosteal dissection was carried lamina of T11-12 confirmed by interoperative x-ray.  Then the lamina of T11 superior aspect lamina T12 was all drilled out with a high-speed drill laminotomy was begun Levophed was identified removed piecemeal fashion expose the thecal sac under microscopic lamination under by the medial gutter was also carried out further drilling down of the superior medial aspect of the T12 pedicle and lateral facet joint allowed access to the lateral margin of the space.  There was an extensive epidural venous plexus overlying a large disc herniation with multiple fragments extending from above and below the T11-12 to space.   Epidural veins coagulated the space was incised utilizing combination of nerve hooks several large fragments removed Monday thecal sac there was also an excellent and extensive mount of inflammatory tissue we got a lot of disc material out it is possible it still could be some retained disc that I could not free up from the undersurface of the ventral dura but wanted to minimize retraction of the thoracic spinal cord.  But the dorsal and lateral aspect the spinal cord was widely decompressed.  So easily was able to pass a nerve hook, a long ball-tipped nerve hook underneath the thecal sac from above to below the disc base without resistance and the foramina were widely patent.  There was a copious irrigated meticulous hemostasis was maintained Gelfoam was ON top of dura the muscle and fascia approximate layers with operative Vicryl skin was closed running 4 subcuticular Dermabond benzoin Steri-Strips and sterile dressing was applied patient to cover him in stable condition.  At the end the case on needle count sponge counts were correct.

## 2024-03-31 NOTE — Care Management Important Message (Signed)
 Important Message  Patient Details  Name: Aaron Marquez MRN: [id number] Date of Birth: [birth year]   Important Message Given:  Yes - Medicare IM     Claretta Deed 03/31/2024, 4:33 PM

## 2024-04-01 ENCOUNTER — Encounter (HOSPITAL_COMMUNITY): Payer: Self-pay | Admitting: Neurosurgery

## 2024-04-01 DIAGNOSIS — R4182 Altered mental status, unspecified: Secondary | ICD-10-CM | POA: Diagnosis not present

## 2024-04-01 DIAGNOSIS — M549 Dorsalgia, unspecified: Secondary | ICD-10-CM | POA: Diagnosis not present

## 2024-04-01 DIAGNOSIS — G252 Other specified forms of tremor: Secondary | ICD-10-CM | POA: Diagnosis not present

## 2024-04-01 DIAGNOSIS — I10 Essential (primary) hypertension: Secondary | ICD-10-CM | POA: Diagnosis not present

## 2024-04-01 LAB — BASIC METABOLIC PANEL WITH GFR
Anion gap: 9 (ref 5–15)
BUN: 40 mg/dL — ABNORMAL HIGH (ref 8–23)
CO2: 22 mmol/L (ref 22–32)
Calcium: 7.8 mg/dL — ABNORMAL LOW (ref 8.9–10.3)
Chloride: 101 mmol/L (ref 98–111)
Creatinine, Ser: 1.83 mg/dL — ABNORMAL HIGH (ref 0.61–1.24)
GFR, Estimated: 40 mL/min — ABNORMAL LOW (ref >60)
Glucose, Bld: 123 mg/dL — ABNORMAL HIGH (ref 70–99)
Potassium: 4.1 mmol/L (ref 3.5–5.1)
Sodium: 132 mmol/L — ABNORMAL LOW (ref 135–145)

## 2024-04-01 LAB — CBC WITH DIFFERENTIAL/PLATELET
Abs Immature Granulocytes: 0.12 K/uL — ABNORMAL HIGH (ref 0.00–0.07)
Basophils Absolute: 0 K/uL (ref 0.0–0.1)
Basophils Relative: 0 %
Eosinophils Absolute: 0 K/uL (ref 0.0–0.5)
Eosinophils Relative: 0 %
HCT: 41.4 % (ref 39.0–52.0)
Hemoglobin: 13.7 g/dL (ref 13.0–17.0)
Immature Granulocytes: 1 %
Lymphocytes Relative: 13 %
Lymphs Abs: 1.7 K/uL (ref 0.7–4.0)
MCH: 26.2 pg (ref 26.0–34.0)
MCHC: 33.1 g/dL (ref 30.0–36.0)
MCV: 79.3 fL — ABNORMAL LOW (ref 80.0–100.0)
Monocytes Absolute: 1.7 K/uL — ABNORMAL HIGH (ref 0.1–1.0)
Monocytes Relative: 13 %
Neutro Abs: 9.3 K/uL — ABNORMAL HIGH (ref 1.7–7.7)
Neutrophils Relative %: 73 %
Platelets: 165 K/uL (ref 150–400)
RBC: 5.22 MIL/uL (ref 4.22–5.81)
RDW: 13.5 % (ref 11.5–15.5)
WBC: 12.9 K/uL — ABNORMAL HIGH (ref 4.0–10.5)
nRBC: 0 % (ref 0.0–0.2)

## 2024-04-01 MED ORDER — HEPARIN SODIUM (PORCINE) 5000 UNIT/ML IJ SOLN
5000.0000 [IU] | Freq: Three times a day (TID) | INTRAMUSCULAR | Status: DC
  Administered 2024-04-02 (×3): 5000 [IU] via SUBCUTANEOUS
  Filled 2024-04-01 (×4): qty 1

## 2024-04-01 MED FILL — Thrombin For Soln 5000 Unit: CUTANEOUS | Qty: 2 | Status: AC

## 2024-04-01 NOTE — Evaluation (Signed)
 Physical Therapy Re-Evaluation Patient Details Name: Aaron Marquez MRN: [id number] DOB: [birth year] Today's Date: 04/01/2024  History of Present Illness  Pt is a 68 y/o M admitted on 03/26/24 after presenting with c/o confusion, increasing upper back pain & LUE pain.  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 S/p decompressive thoracic laminectomy microdiscectomy T11-12 8/25. PMH: HTN, gout, lumbar hernia, GERD, back pain   Clinical Impression  Pt is now s/p decompressive thoracic laminectomy microdiscectomy T11-12 8/25. He reports no numbness/tingling down his legs, but displays deficits in generalized strength, especially in his legs. His strength deficits may be impacted by his severe back pain this date, even after being premedicated with IV pain meds. His R leg appeared to be weaker than his L with attempts at MMT and with functional mobility. Currently, the pt displays deficits also in cognition, balance, and activity tolerance. He is requiring mod-maxAx2 for bed mobility, modAx2 to transfer sit to stand, and minAx2 to take a few pivotal steps with RW support before needing to sit due to pain. He is at high risk for falls and could benefit from short-term inpatient rehab, < 3 hours/day, to maximize his return to baseline. Will continue to follow acutely.      If plan is discharge home, recommend the following: Two people to help with walking and/or transfers;Two people to help with bathing/dressing/bathroom;Assistance with cooking/housework;Direct supervision/assist for medications management;Direct supervision/assist for financial management;Assist for transportation;Help with stairs or ramp for entrance;Supervision due to cognitive status   Can travel by private vehicle   No    Equipment Recommendations Rolling walker (2 wheels);BSC/3in1;Wheelchair (measurements PT);Wheelchair cushion (measurements PT);Hospital bed (pending  progress)  Recommendations for Other Services       Functional Status Assessment Patient has had a recent decline in their functional status and demonstrates the ability to make significant improvements in function in a reasonable and predictable amount of time.     Precautions / Restrictions Precautions Precautions: Fall;Back Precaution Booklet Issued: No Recall of Precautions/Restrictions: Impaired Precaution/Restrictions Comments: reviewed spinal precautions Required Braces or Orthoses:  (no orders for brace; Orders for Mobility Protocol: No Restrictions in place; Dr. Cherlyn to check with NSGY about back brace needs) Restrictions Weight Bearing Restrictions Per Provider Order: No      Mobility  Bed Mobility Overal bed mobility: Needs Assistance Bed Mobility: Sidelying to Sit, Sit to Sidelying   Sidelying to sit: Max assist, HOB elevated, +2 for physical assistance, +2 for safety/equipment     Sit to sidelying: Mod assist, +2 for physical assistance, +2 for safety/equipment, HOB elevated General bed mobility comments: Pt already rolled onto his R side upon arrival. Pt needing maxAx2 and multi-modal cues to bring legs off EOB and ascend trunk. Pt did initiate moving legs off EOB when cued. Pt initiated descending trunk by leaning onto his R elbow when cued well, but needed modAx2 to lift legs and direct his trunk safely back to R sidelying from sitting EOB.    Transfers Overall transfer level: Needs assistance Equipment used: Rolling walker (2 wheels) Transfers: Sit to/from Stand, Bed to chair/wheelchair/BSC Sit to Stand: Mod assist, +2 physical assistance, +2 safety/equipment, From elevated surface   Step pivot transfers: +2 physical assistance, Min assist, +2 safety/equipment       General transfer comment: EOB elevated mildly. Pt attempted x2 reps before being successful, the first rep with bil hands on the bed to push up, the second rep with bil hands  on the RW to pull up  to stand. Pt demonstrated better initiation the second rep and only needed modAx2 to power up to stand, using the bed pad as a sling. Once standing, pt able to take pivotal steps to R towards [facility] with verbal and tactile cues for initiating R steps and for weight shifting, minAx2 for balance.    Ambulation/Gait Ambulation/Gait assistance: Min assist, +2 physical assistance, +2 safety/equipment Gait Distance (Feet): 2 Feet Assistive device: Rolling walker (2 wheels) Gait Pattern/deviations: Step-to pattern, Decreased step length - right, Decreased step length - left, Decreased stride length, Decreased weight shift to right, Decreased weight shift to left Gait velocity: reduced Gait velocity interpretation: <1.31 ft/sec, indicative of household ambulator   General Gait Details: Once standing, pt able to take pivotal steps to R towards [facility] with verbal and tactile cues for initiating R steps and for weight shifting, minAx2 for balance. Pt takes smaller steps with his R than his L  Stairs            Wheelchair Mobility     Tilt Bed    Modified Rankin (Stroke Patients Only)       Balance Overall balance assessment: Needs assistance Sitting-balance support: Bilateral upper extremity supported, Feet supported Sitting balance-Leahy Scale: Poor Sitting balance - Comments: Pt tends to lean posteriorly due to back pain, preferring min-modA posteriorly for support to relieve the pain, but pt capable of sitting statically with UE support on the bed with CGA for safety only.   Standing balance support: Bilateral upper extremity supported, During functional activity, Reliant on assistive device for balance Standing balance-Leahy Scale: Poor Standing balance comment: reliant on RW and +2 assist                             Pertinent Vitals/Pain Pain Assessment Pain Assessment: Faces Faces Pain Scale: Hurts whole lot Pain Location: back Pain Descriptors / Indicators: Discomfort,  Grimacing, Guarding, Operative site guarding, Moaning Pain Intervention(s): Limited activity within patient's tolerance, Monitored during session, Premedicated before session, Repositioned    Home Living Family/patient expects to be discharged to:: Private residence Living Arrangements: Spouse/significant other Available Help at Discharge: Family (wife works from home) Type of Home: House Home Access: Stairs to enter Entrance Stairs-Rails: None Secretary/administrator of Steps: 2   Home Layout: One level Home Equipment: Cane - single point;Rollator (4 wheels)      Prior Function Prior Level of Function : Driving             Mobility Comments: Ambulatory with [facility]PC, 1 fall in the past 6 months 2/2 the dog. ADLs Comments: pt reports wife helps with whatever I need     Extremity/Trunk Assessment   Upper Extremity Assessment Upper Extremity Assessment: Defer to OT evaluation    Lower Extremity Assessment Lower Extremity Assessment: Generalized weakness;RLE deficits/detail;LLE deficits/detail RLE Deficits / Details: R appeared weaker than L, pt slow to process and initiate cues and thus difficult to accurately assess strength though, 2+/5 in quads when testing but no buckling when standing which would indicate a higher level of strength; denied numbness/tingling throughout LLE Deficits / Details: pt slow to process and initiate cues and thus difficult to accurately assess strength though, 3-/5 in quads when testing but no buckling when standing which would indicate a higher level of strength; denied numbness/tingling throughout    Cervical / Trunk Assessment Cervical / Trunk Assessment: Back Surgery  Communication   Communication  Communication: No apparent difficulties    Cognition Arousal: Alert Behavior During Therapy: Flat affect   PT - Cognitive impairments: Awareness, Attention, Initiation, Sequencing, Problem solving, Safety/Judgement                        PT - Cognition Comments: Pt with flat affect and delayed processing and initiation, potentially impacted by IV pain meds administered prior to session. Pt needed extra time and step-by-step cues to sequence and perform all mobility. Following commands: Impaired Following commands impaired: Follows one step commands with increased time     Cueing Cueing Techniques: Verbal cues, Tactile cues, Gestural cues     General Comments      Exercises     Assessment/Plan    PT Assessment Patient needs continued PT services  PT Problem List Pain;Decreased range of motion;Decreased activity tolerance;Decreased mobility;Decreased balance;Decreased safety awareness;Decreased knowledge of precautions;Decreased knowledge of use of DME;Decreased strength;Decreased cognition;Decreased coordination       PT Treatment Interventions DME instruction;Balance training;Gait training;Neuromuscular re-education;Stair training;Patient/family education;Therapeutic exercise;Therapeutic activities;Functional mobility training;Cognitive remediation;Wheelchair mobility training    PT Goals (Current goals can be found in the Care Plan section)  Acute Rehab PT Goals Patient Stated Goal: to reduce pain PT Goal Formulation: With patient Time For Goal Achievement: 04/15/24 Potential to Achieve Goals: Good    Frequency Min 5X/week     Co-evaluation   Reason for Co-Treatment: For patient/therapist safety;To address functional/ADL transfers;Other (comment) (pain limiting) PT goals addressed during session: Mobility/safety with mobility;Balance;Proper use of DME         AM-PAC PT 6 Clicks Mobility  Outcome Measure Help needed turning from your back to your side while in a flat bed without using bedrails?: A Lot Help needed moving from lying on your back to sitting on the side of a flat bed without using bedrails?: Total Help needed moving to and from a bed to a chair (including a wheelchair)?: Total Help  needed standing up from a chair using your arms (e.g., wheelchair or bedside chair)?: Total Help needed to walk in hospital room?: Total Help needed climbing 3-5 steps with a railing? : Total 6 Click Score: 7    End of Session Equipment Utilized During Treatment: Gait belt Activity Tolerance: Patient limited by pain Patient left: in bed;with call bell/phone within reach;with bed alarm set   PT Visit Diagnosis: Pain;Difficulty in walking, not elsewhere classified (R26.2);Unsteadiness on feet (R26.81);Other abnormalities of gait and mobility (R26.89);Muscle weakness (generalized) (M62.81) Pain - Right/Left:  (back) Pain - part of body:  (back)    Time: 9160-9099 PT Time Calculation (min) (ACUTE ONLY): 21 min   Charges:   PT Evaluation $PT Re-evaluation: 1 Re-eval   PT General Charges $$ ACUTE PT VISIT: 1 Visit         Theo Ferretti, PT, DPT Acute Rehabilitation Services  Office: [phone]   Theo CHRISTELLA Ferretti 04/01/2024, 9:29 AM

## 2024-04-01 NOTE — Anesthesia Postprocedure Evaluation (Signed)
 Anesthesia Post Note  Patient: Estle Huguley  Procedure(s) Performed: DECOMPRESSIVE THORACIC LAMINECTOMY THORACIC ELEVEN-TWELVE (Spine Thoracic)     Patient location during evaluation: PACU Anesthesia Type: General Level of consciousness: awake and alert Pain management: pain level controlled Vital Signs Assessment: post-procedure vital signs reviewed and stable Respiratory status: spontaneous breathing, nonlabored ventilation, respiratory function stable and patient connected to nasal cannula oxygen Cardiovascular status: blood pressure returned to baseline and stable Postop Assessment: no apparent nausea or vomiting Anesthetic complications: no   No notable events documented.  Last Vitals:  Vitals:   04/01/24 0753 04/01/24 2005  BP: (!) 151/84 (!) 141/89  Pulse: 72 71  Resp: 18 20  Temp: 36.7 C 36.7 C  SpO2: 97% 100%    Last Pain:  Vitals:   04/01/24 2005  TempSrc: Oral  PainSc:                  Toluwanimi Radebaugh S

## 2024-04-01 NOTE — Evaluation (Signed)
 Occupational Therapy Re-Evaluation Patient Details Name: Aaron Marquez MRN: [id number] DOB: 12-12-1955 Today's Date: 04/01/2024   History of Present Illness   Pt is a 68 y/o M admitted on 03/26/24 after presenting with c/o confusion, increasing upper back pain & LUE pain.  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 S/p decompressive thoracic laminectomy microdiscectomy T11-12 8/25. PMH: HTN, gout, lumbar hernia, GERD, back pain     Clinical Impressions Pt seen for first OT session s/p spinal sx noted above. Pt limited by pain despite IV pain premedication. Overall, pt requires significant +2 assist for bed mobility and standing with RW at bedside. Pt requires CGA for sitting balance and extensive assist for LB ADLs. Repositioned on side for comfort w/ ice pack. Continue to recommend inpatient follow up therapy, <3 hours/day upon DC.     If plan is discharge home, recommend the following:   A lot of help with bathing/dressing/bathroom;Assistance with cooking/housework;Direct supervision/assist for medications management;Direct supervision/assist for financial management;Assist for transportation;Help with stairs or ramp for entrance;A lot of help with walking and/or transfers     Functional Status Assessment   Patient has had a recent decline in their functional status and demonstrates the ability to make significant improvements in function in a reasonable and predictable amount of time.     Equipment Recommendations   BSC/3in1;Other (comment) (RW)     Recommendations for Other Services         Precautions/Restrictions   Precautions Precautions: Fall;Back Precaution Booklet Issued: No Recall of Precautions/Restrictions: Impaired Precaution/Restrictions Comments: reviewed spinal precautions Required Braces or Orthoses:  (no orders for brace; Orders for Mobility Protocol: No Restrictions in place; Dr. Cherlyn to  check with NSGY about back brace needs) Restrictions Weight Bearing Restrictions Per Provider Order: No     Mobility Bed Mobility Overal bed mobility: Needs Assistance Bed Mobility: Sidelying to Sit, Sit to Sidelying   Sidelying to sit: Max assist, HOB elevated, +2 for physical assistance, +2 for safety/equipment     Sit to sidelying: Mod assist, +2 for physical assistance, +2 for safety/equipment, HOB elevated General bed mobility comments: Pt already rolled onto his R side upon arrival. Pt needing maxAx2 and multi-modal cues to bring legs off EOB and ascend trunk. Pt did initiate moving legs off EOB when cued. Pt initiated descending trunk by leaning onto his R elbow when cued well, but needed modAx2 to lift legs and direct his trunk safely back to R sidelying from sitting EOB.    Transfers Overall transfer level: Needs assistance Equipment used: Rolling walker (2 wheels) Transfers: Sit to/from Stand, Bed to chair/wheelchair/BSC Sit to Stand: Mod assist, +2 physical assistance, +2 safety/equipment, From elevated surface     Step pivot transfers: +2 physical assistance, Min assist, +2 safety/equipment     General transfer comment: EOB elevated mildly. Pt attempted x2 reps before being successful, the first rep with bil hands on the bed to push up, the second rep with bil hands on the RW to pull up to stand. Pt demonstrated better initiation the second rep and only needed modAx2 to power up to stand, using the bed pad as a sling. Once standing, pt able to take pivotal steps to R towards [facility] with verbal and tactile cues for initiating R steps and for weight shifting, minAx2 for balance.      Balance Overall balance assessment: Needs assistance Sitting-balance support: Bilateral upper extremity supported, Feet supported Sitting balance-Leahy Scale: Poor Sitting  balance - Comments: Pt tends to lean posteriorly due to back pain, preferring min-modA posteriorly for support to relieve the  pain, but pt capable of sitting statically with UE support on the bed with CGA for safety only.   Standing balance support: Bilateral upper extremity supported, During functional activity, Reliant on assistive device for balance Standing balance-Leahy Scale: Poor Standing balance comment: reliant on RW and +2 assist                           ADL either performed or assessed with clinical judgement   ADL Overall ADL's : Needs assistance/impaired Eating/Feeding: Set up;Sitting   Grooming: Contact guard assist;Sitting Grooming Details (indicate cue type and reason): CGA for balance/safety sitting EOB Upper Body Bathing: Moderate assistance;Sitting   Lower Body Bathing: Maximal assistance;+2 for physical assistance;+2 for safety/equipment;Sit to/from stand   Upper Body Dressing : Moderate assistance;Sitting   Lower Body Dressing: Maximal assistance;+2 for physical assistance;+2 for safety/equipment;Sitting/lateral leans;Sit to/from stand       Toileting- Architect and Hygiene: Total assistance;Sitting/lateral lean;Sit to/from stand               Vision Ability to See in Adequate Light: 0 Adequate Patient Visual Report: No change from baseline Vision Assessment?: No apparent visual deficits     Perception         Praxis         Pertinent Vitals/Pain Pain Assessment Pain Assessment: Faces Faces Pain Scale: Hurts whole lot Pain Location: back Pain Descriptors / Indicators: Discomfort, Grimacing, Guarding, Operative site guarding, Moaning Pain Intervention(s): Monitored during session, Limited activity within patient's tolerance, Premedicated before session, Repositioned, Ice applied     Extremity/Trunk Assessment Upper Extremity Assessment Upper Extremity Assessment: Generalized weakness;Right hand dominant   Lower Extremity Assessment Lower Extremity Assessment: Defer to PT evaluation RLE Deficits / Details: R appeared weaker than L, pt slow  to process and initiate cues and thus difficult to accurately assess strength though, 2+/5 in quads when testing but no buckling when standing which would indicate a higher level of strength; denied numbness/tingling throughout LLE Deficits / Details: pt slow to process and initiate cues and thus difficult to accurately assess strength though, 3-/5 in quads when testing but no buckling when standing which would indicate a higher level of strength; denied numbness/tingling throughout   Cervical / Trunk Assessment Cervical / Trunk Assessment: Back Surgery   Communication Communication Communication: No apparent difficulties   Cognition Arousal: Alert Behavior During Therapy: Flat affect Cognition: Cognition impaired     Awareness: Intellectual awareness intact, Online awareness impaired Memory impairment (select all impairments): Short-term memory, Working memory Attention impairment (select first level of impairment): Selective attention, Sustained attention Executive functioning impairment (select all impairments): Initiation, Problem solving OT - Cognition Comments: slower processing, cues for sequencing and problem solving needed                 Following commands: Impaired Following commands impaired: Follows one step commands with increased time     Cueing  General Comments   Cueing Techniques: Verbal cues;Tactile cues;Gestural cues      Exercises     Shoulder Instructions      Home Living Family/patient expects to be discharged to:: Private residence Living Arrangements: Spouse/significant other Available Help at Discharge: Family (wife works from home) Type of Home: House Home Access: Stairs to enter Secretary/administrator of Steps: 2 Entrance Stairs-Rails: None Home Layout: One level  Bathroom Shower/Tub: Tub/shower unit;Walk-in shower         Home Equipment: Cane - single point;Rollator (4 wheels)          Prior Functioning/Environment Prior  Level of Function : Driving             Mobility Comments: Ambulatory with SPC, 1 fall in the past 6 months 2/2 the dog. ADLs Comments: pt reports wife helps with whatever I need    OT Problem List: Decreased strength;Decreased activity tolerance;Impaired balance (sitting and/or standing);Decreased cognition;Decreased safety awareness;Decreased knowledge of use of DME or AE;Obesity;Pain   OT Treatment/Interventions: Self-care/ADL training;DME and/or AE instruction;Therapeutic activities;Cognitive remediation/compensation;Patient/family education;Balance training      OT Goals(Current goals can be found in the care plan section)   Acute Rehab OT Goals Patient Stated Goal: pain control OT Goal Formulation: With patient Time For Goal Achievement: 04/11/24 Potential to Achieve Goals: Good   OT Frequency:  Min 2X/week    Co-evaluation   Reason for Co-Treatment: For patient/therapist safety;To address functional/ADL transfers;Other (comment) (pain limiting) PT goals addressed during session: Mobility/safety with mobility;Balance;Proper use of DME        AM-PAC OT 6 Clicks Daily Activity     Outcome Measure Help from another person eating meals?: None Help from another person taking care of personal grooming?: A Little Help from another person toileting, which includes using toliet, bedpan, or urinal?: Total Help from another person bathing (including washing, rinsing, drying)?: A Lot Help from another person to put on and taking off regular upper body clothing?: A Lot Help from another person to put on and taking off regular lower body clothing?: Total 6 Click Score: 13   End of Session Equipment Utilized During Treatment: Gait belt;Rolling walker (2 wheels) Nurse Communication: Mobility status  Activity Tolerance: Patient tolerated treatment well;Patient limited by pain Patient left: in bed;with call bell/phone within reach;with bed alarm set  OT Visit Diagnosis:  Unsteadiness on feet (R26.81);Other abnormalities of gait and mobility (R26.89);Pain;Other symptoms and signs involving cognitive function;Muscle weakness (generalized) (M62.81)                Time: 9165-9141 OT Time Calculation (min): 24 min Charges:  OT General Charges $OT Visit: 1 Visit OT Evaluation $OT Re-eval: 1 Re-eval  Mliss NOVAK, OTR/L Acute Rehab Services Office: [phone]   Mliss Fish 04/01/2024, 9:41 AM

## 2024-04-01 NOTE — TOC Progression Note (Signed)
 Transition of Care [facility]) - Progression Note    Patient Details  Name: Aaron Marquez MRN: [id number] Date of Birth: 1955/11/25  Transition of Care [facility]) CM/SW Contact  Lendia Dais, [place] Phone Number: 04/01/2024, 1:27 PM  Clinical Narrative:  CSW reached out to Alyssa from [employer]. Regular persons Soy was out of the office today.   Alyssa stated that a bed wouldn't be available until tomorrow and that admissions paperwork would need to be filled out before the patient can be admitted to [facility]. MD was notified.  CSW spoke to pt and wife Jeanetta at bedside that the admissions paperwork needs to be filled out at the nursing facility. Jeanetta stated that she would go tomorrow morning.  CSW informed Alyssa at [name] and was agreeable.   CSW will continue to follow.                      Expected Discharge Plan and Services                                               Social Drivers of Health (SDOH) Interventions SDOH Screenings   Food Insecurity: No Food Insecurity (03/27/2024)  Housing: Low Risk  (03/27/2024)  Transportation Needs: No Transportation Needs (03/27/2024)  Utilities: Not At Risk (03/27/2024)  Financial Resource Strain: Low Risk  (12/26/2023)   Received from [facility]  Physical Activity: Unknown (12/26/2023)   Received from [facility]  Social Connections: Patient Declined (03/27/2024)  Stress: No Stress Concern Present (12/26/2023)   Received from [facility]  Tobacco Use: Low Risk  (03/31/2024)    Readmission Risk Interventions    03/27/2024    3:11 PM  Readmission Risk Prevention Plan  Transportation Screening Complete  PCP or Specialist Appt within 5-7 Days Complete  Home Care Screening Complete  Medication Review (RN CM) Referral to Pharmacy

## 2024-04-01 NOTE — Progress Notes (Signed)
 [facility]                                                                               Aaron Marquez, is a 68 y.o. male, DOB - 1956/06/03, FMW:[id number] Admit date - 03/26/2024    Outpatient Primary MD for the patient is [name], FNP  LOS - 5  days    Brief summary    Aaron Marquez is a 68 y.o. male with history of hypertension, gout, back pain who had epidural injection in the low back about 2 weeks ago was brought to the ER after patient was found to be confused with increasing pain in the upper back and also complained of some left arm pain.  MRI T spine shows  Left posterolateral disc protrusion at T11-12 impressing upon the ventral surface of the spinal cord and causing moderate left-sided spinal canal stenosis. Minimal posterior disc bulging at T3-4, T5-6, T6-7, T7-8, T8-9, T9-10, and T10-11 with very mild central spinal canal stenosis at each level, without impingement upon the spinal cord or nerve roots. MRI brain without contrast is negative for acute pathology.  Neurosurgery consulted and he underwent decompressive thoracic laminectomy, microdiscectomy at T11 T12 on 8/25.  Therapy eval recommending SNF.   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 No bowel or bladder incontinence. Patient reports some tremors in the left arm attributed to Parkinson's .  Pain control with  IV morphine  1 to 2 mg in addition to hydrocodone  5 -325 mg every 6 hours.  Pain better controlled with IV decadron .  NS consulted, underwent decompressive thoracic laminectomy, microdiscectomy at T11 T12 on 8/25. CK levels wnl.  Post op patient reports sensory deficits on the lower extremities improved.  Therapy evaluations ordered.  Hypertension Optimal. Continue with amlodipine , metoprolol  and prn hydralazine .    Hyperlipidemia Resume statin.    Mildly elevated troponins No chest pain. Last echo in 01/2024 showed LVEF of 65% to 70%, no regional wall abn. Right ventricular systolic function is normal.  EKG shows sinus rhythm with 1 st degree block, with st t wave abn. Unchanged from 2024.    Stage 3b CKD Creatinine at baseline.    Mild thrombocytopenia 141,000 Repeat labs ordered.   Parkinson's disease Continue with Sinemet  1 tab BID.  Gout Continue with allopurinol .       Estimated body mass index is 34.96 kg/m as calculated from the following:   Height as of this encounter: 6' 1 (1.854 m).   Weight as of this encounter: 120.2 kg.  Code Status: full code.  DVT Prophylaxis:  SCDs Start: 03/27/24 0436   Level of Care: Level of care: Med-Surg Family Communication: none at bedside.  Disposition Plan:     Remains inpatient appropriate:  pending SNF.   Procedures:  MRI T SPINE thoracic laminectomy T11-12 and microdiscectomy.   Consultants:   Neurosurgery   Antimicrobials:   Anti-infectives (From admission, onward)    Start     Dose/Rate Route Frequency Ordered Stop   04/01/24 0600  ceFAZolin  (  ANCEF ) IVPB 3g/150 mL premix        3 g 300 mL/hr over 30 Minutes Intravenous On call to O.R. 03/31/24 1218 03/31/24 1448   03/31/24 2100  ceFAZolin  (ANCEF ) IVPB 2g/100 mL premix        2 g 200 mL/hr over 30 Minutes Intravenous Every 6 hours 03/31/24 1814 04/01/24 0355   03/31/24 1148  ceFAZolin  (ANCEF ) 2-4 GM/100ML-% IVPB       Note to Pharmacy: Larina Mays A: cabinet override      03/31/24 1148 03/31/24 2359        Medications  Scheduled Meds:  allopurinol   100 mg Oral Daily   amLODipine   10 mg Oral Daily   atorvastatin   80 mg Oral Daily   carbidopa -levodopa   1 tablet Oral BID   irbesartan   75 mg Oral Daily   lidocaine   1 patch Transdermal Q24H   metoprolol  succinate  50 mg Oral Daily   pantoprazole   40 mg Oral Daily   Continuous Infusions:  sodium chloride  75 mL/hr at 03/31/24 1819   PRN  Meds:.acetaminophen , hydrALAZINE , HYDROcodone -acetaminophen , methocarbamol , morphine  injection    Subjective:   Aaron Marquez was seen and examined today. Some back pain today. No chest pain or sob.   Objective:   Vitals:   03/31/24 1816 03/31/24 2009 04/01/24 0457 04/01/24 0753  BP: 127/82 (!) 149/90 112/70 (!) 151/84  Pulse: 75 76 67 72  Resp: 18 18 17 18   Temp:  (!) 97.5 F (36.4 C) (!) 97.4 F (36.3 C) 98 F (36.7 C)  TempSrc:    Oral  SpO2: 99% 97% 95% 97%  Weight:      Height:        Intake/Output Summary (Last 24 hours) at 04/01/2024 1212 Last data filed at 04/01/2024 1000 Gross per 24 hour  Intake 2979.95 ml  Output 2000 ml  Net 979.95 ml   Filed Weights   03/26/24 1946 03/31/24 1212  Weight: 120.2 kg 120.2 kg     Exam General exam: Appears calm and comfortable  Respiratory system: Clear to auscultation. Respiratory effort normal. Cardiovascular system: S1 & S2 heard, RRR. No JVD,  Gastrointestinal system: Abdomen is nondistended, soft and nontender.  Central nervous system: Alert and oriented.  Extremities: no cyanosis.  Skin: No rashes,  Psychiatry: Mood & affect appropriate.      Data Reviewed:  I have personally reviewed following labs and imaging studies   CBC Lab Results  Component Value Date   WBC 3.7 (L) 03/27/2024   RBC 5.80 03/27/2024   HGB 15.3 03/27/2024   HCT 45.4 03/27/2024   MCV 78.3 (L) 03/27/2024   MCH 26.4 03/27/2024   PLT 141 (L) 03/27/2024   MCHC 33.7 03/27/2024   RDW 13.2 03/27/2024   LYMPHSABS 1.3 03/27/2024   MONOABS 0.4 03/27/2024   EOSABS 0.1 03/27/2024   BASOSABS 0.0 03/27/2024     Last metabolic panel Lab Results  Component Value Date   NA 140 03/27/2024   K 3.7 03/27/2024   CL 105 03/27/2024   CO2 23 03/27/2024   BUN 19 03/27/2024   CREATININE 1.73 (H) 03/27/2024   GLUCOSE 98 03/27/2024   GFRNONAA 43 (L) 03/27/2024   GFRAA 45 (L) 06/01/2018   CALCIUM  8.5 (L) 03/27/2024   PROT 6.0 (L) 03/27/2024    ALBUMIN  3.2 (L) 03/27/2024   LABGLOB 2.9 06/21/2023   LABGLOB 2.3 06/21/2023   AGRATIO 1.3 06/21/2023   BILITOT 1.1 03/27/2024   ALKPHOS 60 03/27/2024   AST  15 03/27/2024   ALT 7 03/27/2024   ANIONGAP 12 03/27/2024    CBG (last 3)  No results for input(s): GLUCAP in the last 72 hours.     Coagulation Profile: No results for input(s): INR, PROTIME in the last 168 hours.   Radiology Studies: DG Lumbar Spine 2-3 Views Result Date: 03/31/2024 CLINICAL DATA:  Elective surgery. EXAM: LUMBAR SPINE - 2-3 VIEW COMPARISON:  CT 02/04/2024 FINDINGS: Two portable cross-table lateral views of the lumbar spine submitted from the operating room. Image 1 demonstrates surgical instrument localizing posteriorly at the T11-T12 level. Image 2 demonstrates surgical instruments projecting over the posterior elements at the T11-T12 level. IMPRESSION: Intraoperative localization at T11-T12. Electronically Signed   By: Andrea Gasman M.D.   On: 03/31/2024 17:31        Elgie Butter M.D. [facility] 04/01/2024, 12:12 PM  Available via Epic secure chat 7am-7pm After 7 pm, please refer to night coverage provider listed on amion.

## 2024-04-01 NOTE — Progress Notes (Signed)
 Subjective: Patient reports doing well significant proved and feeling in lower extremities  Objective: Vital signs in last 24 hours: Temp:  [97.4 F (36.3 C)-98 F (36.7 C)] 98 F (36.7 C) (08/26 0753) Pulse Rate:  [67-77] 72 (08/26 0753) Resp:  [10-18] 18 (08/26 0753) BP: (112-151)/(70-90) 151/84 (08/26 0753) SpO2:  [95 %-99 %] 97 % (08/26 0753) Weight:  [120.2 kg] 120.2 kg (08/25 1212)  Intake/Output from previous day: 08/25 0701 - 08/26 0700 In: 2980 [P.O.:240; I.V.:1640; IV Piggyback:1100] Out: 1400 [Urine:700; Blood:700] Intake/Output this shift: No intake/output data recorded.  Awake and alert strength appears to be 5 out of 5 maybe some slight dorsiflexion weakness on the left at 4+ out of 5 baseline incision clean dry and intact  Lab Results: No results for input(s): WBC, HGB, HCT, PLT in the last 72 hours. BMET No results for input(s): NA, K, CL, CO2, GLUCOSE, BUN, CREATININE, CALCIUM  in the last 72 hours.  Studies/Results: DG Lumbar Spine 2-3 Views Result Date: 03/31/2024 CLINICAL DATA:  Elective surgery. EXAM: LUMBAR SPINE - 2-3 VIEW COMPARISON:  CT 02/04/2024 FINDINGS: Two portable cross-table lateral views of the lumbar spine submitted from the operating room. Image 1 demonstrates surgical instrument localizing posteriorly at the T11-T12 level. Image 2 demonstrates surgical instruments projecting over the posterior elements at the T11-T12 level. IMPRESSION: Intraoperative localization at T11-T12. Electronically Signed   By: Andrea Gasman M.D.   On: 03/31/2024 17:31    Assessment/Plan: Postop day 1 decompressive thoracic laminectomy microdiscectomy T11-12 doing well with significant proving preoperative lower extremity symptoms.  Mobilize with physical and Occupational Therapy work on discharge when cleared from therapy and determination of inpatient versus home with home health  LOS: 5 days     Aaron Marquez 04/01/2024, 8:04 AM

## 2024-04-02 DIAGNOSIS — M546 Pain in thoracic spine: Secondary | ICD-10-CM | POA: Diagnosis not present

## 2024-04-02 MED ORDER — HYDROCODONE-ACETAMINOPHEN 5-325 MG PO TABS: 1.0000 | ORAL_TABLET | Freq: Four times a day (QID) | ORAL | 0 refills | Status: DC | PRN

## 2024-04-02 MED ORDER — LIDOCAINE 5 % EX PTCH
1.0000 | MEDICATED_PATCH | CUTANEOUS | Status: AC
Start: 2024-04-02 — End: ?

## 2024-04-02 NOTE — Plan of Care (Signed)
  Problem: Health Behavior/Discharge Planning: Goal: Ability to manage health-related needs will improve 04/02/2024 1233 by Jacquelyn Heather RAMAN, RN Outcome: Progressing 04/02/2024 1149 by Jacquelyn Heather RAMAN, RN Outcome: Adequate for Discharge   Problem: Clinical Measurements: Goal: Ability to maintain clinical measurements within normal limits will improve 04/02/2024 1233 by Jacquelyn Heather RAMAN, RN Outcome: Progressing 04/02/2024 1149 by Jacquelyn Heather RAMAN, RN Outcome: Adequate for Discharge Goal: Will remain free from infection 04/02/2024 1233 by Jacquelyn Heather RAMAN, RN Outcome: Progressing 04/02/2024 1149 by Jacquelyn Heather RAMAN, RN Outcome: Adequate for Discharge Goal: Diagnostic test results will improve 04/02/2024 1233 by Jacquelyn Heather RAMAN, RN Outcome: Progressing 04/02/2024 1149 by Jacquelyn Heather RAMAN, RN Outcome: Adequate for Discharge Goal: Respiratory complications will improve 04/02/2024 1233 by Jacquelyn Heather RAMAN, RN Outcome: Progressing 04/02/2024 1149 by Jacquelyn Heather RAMAN, RN Outcome: Adequate for Discharge Goal: Cardiovascular complication will be avoided 04/02/2024 1233 by Jacquelyn Heather RAMAN, RN Outcome: Progressing 04/02/2024 1149 by Jacquelyn Heather RAMAN, RN Outcome: Adequate for Discharge   Problem: Activity: Goal: Risk for activity intolerance will decrease 04/02/2024 1233 by Jacquelyn Heather RAMAN, RN Outcome: Progressing 04/02/2024 1149 by Jacquelyn Heather RAMAN, RN Outcome: Adequate for Discharge   Problem: Nutrition: Goal: Adequate nutrition will be maintained 04/02/2024 1233 by Jacquelyn Heather RAMAN, RN Outcome: Progressing 04/02/2024 1149 by Jacquelyn Heather RAMAN, RN Outcome: Adequate for Discharge   Problem: Coping: Goal: Level of anxiety will decrease 04/02/2024 1233 by Jacquelyn Heather RAMAN, RN Outcome: Progressing 04/02/2024 1149 by Jacquelyn Heather RAMAN, RN Outcome: Adequate for Discharge    Problem: Elimination: Goal: Will not experience complications related to bowel motility 04/02/2024 1233 by Jacquelyn Heather RAMAN, RN Outcome: Progressing 04/02/2024 1149 by Jacquelyn Heather RAMAN, RN Outcome: Adequate for Discharge Goal: Will not experience complications related to urinary retention 04/02/2024 1233 by Jacquelyn Heather RAMAN, RN Outcome: Progressing 04/02/2024 1149 by Jacquelyn Heather RAMAN, RN Outcome: Adequate for Discharge   Problem: Pain Managment: Goal: General experience of comfort will improve and/or be controlled 04/02/2024 1233 by Jacquelyn Heather RAMAN, RN Outcome: Progressing 04/02/2024 1149 by Jacquelyn Heather RAMAN, RN Outcome: Adequate for Discharge   Problem: Safety: Goal: Ability to remain free from injury will improve 04/02/2024 1233 by Jacquelyn Heather RAMAN, RN Outcome: Progressing 04/02/2024 1149 by Jacquelyn Heather RAMAN, RN Outcome: Adequate for Discharge   Problem: Skin Integrity: Goal: Risk for impaired skin integrity will decrease 04/02/2024 1233 by Jacquelyn Heather RAMAN, RN Outcome: Progressing 04/02/2024 1149 by Jacquelyn Heather RAMAN, RN Outcome: Adequate for Discharge

## 2024-04-02 NOTE — Progress Notes (Signed)
 Subjective: Patient reports patient doing well little more soreness back today but lower extremity still improved  Objective: Vital signs in last 24 hours: Temp:  [98.1 F (36.7 C)-98.3 F (36.8 C)] 98.2 F (36.8 C) (08/27 0817) Pulse Rate:  [61-71] 65 (08/27 0817) Resp:  [18-20] 18 (08/27 0817) BP: (113-141)/(69-89) 123/70 (08/27 0817) SpO2:  [96 %-100 %] 97 % (08/27 0817)  Intake/Output from previous day: 08/26 0701 - 08/27 0700 In: 240 [P.O.:240] Out: 900 [Urine:900] Intake/Output this shift: No intake/output data recorded.  Awake alert strength stable and improving  Lab Results: Recent Labs    04/01/24 1458  WBC 12.9*  HGB 13.7  HCT 41.4  PLT 165   BMET Recent Labs    04/01/24 1458  NA 132*  K 4.1  CL 101  CO2 22  GLUCOSE 123*  BUN 40*  CREATININE 1.83*  CALCIUM  7.8*    Studies/Results: No results found.  Assessment/Plan: Postop day 2 decompressive thoracic laminectomy and discectomy making progress continue to mobilize with physical Occupational Therapy consider rehab placement  LOS: 6 days     Arley SHAUNNA Helling 04/02/2024, 4:24 PM

## 2024-04-02 NOTE — Plan of Care (Signed)
  Problem: Health Behavior/Discharge Planning: Goal: Ability to manage health-related needs will improve Outcome: Adequate for Discharge   Problem: Clinical Measurements: Goal: Ability to maintain clinical measurements within normal limits will improve Outcome: Adequate for Discharge Goal: Will remain free from infection Outcome: Adequate for Discharge Goal: Diagnostic test results will improve Outcome: Adequate for Discharge Goal: Respiratory complications will improve Outcome: Adequate for Discharge Goal: Cardiovascular complication will be avoided Outcome: Adequate for Discharge   Problem: Activity: Goal: Risk for activity intolerance will decrease Outcome: Adequate for Discharge   Problem: Nutrition: Goal: Adequate nutrition will be maintained Outcome: Adequate for Discharge   Problem: Coping: Goal: Level of anxiety will decrease Outcome: Adequate for Discharge   Problem: Elimination: Goal: Will not experience complications related to bowel motility Outcome: Adequate for Discharge Goal: Will not experience complications related to urinary retention Outcome: Adequate for Discharge   Problem: Pain Managment: Goal: General experience of comfort will improve and/or be controlled Outcome: Adequate for Discharge   Problem: Safety: Goal: Ability to remain free from injury will improve Outcome: Adequate for Discharge   Problem: Skin Integrity: Goal: Risk for impaired skin integrity will decrease Outcome: Adequate for Discharge

## 2024-04-02 NOTE — Plan of Care (Signed)
   Problem: Education: Goal: Knowledge of General Education information will improve Description: Including pain rating scale, medication(s)/side effects and non-pharmacologic comfort measures Outcome: Completed/Met

## 2024-04-02 NOTE — TOC Transition Note (Signed)
 Transition of Care [facility]) - Discharge Note   Patient Details  Name: Aaron Marquez MRN: [id number] Date of Birth: 1955-12-13  [facility] CM/SW Contact:  Lendia Dais, LCSWA Phone Number: 04/02/2024, 2:41 PM   Clinical Narrative:  Pt is dc'ing to [name]. RN report to [phone]. CSW scheduled [facility] at 1430.  Pt and pt's wife have been notified.  No further TOC needs.    Final next level of care: Skilled Nursing Facility Barriers to Discharge: Barriers Resolved   Patient Goals and CMS Choice            Discharge Placement              Patient chooses bed at: [name] Patient to be transferred to facility by: [facility] Name of family member notified: Yaviel Kloster (wife) Patient and family notified of of transfer: 04/02/24  Discharge Plan and Services Additional resources added to the After Visit Summary for                                       Social Drivers of Health (SDOH) Interventions SDOH Screenings   Food Insecurity: No Food Insecurity (03/27/2024)  Housing: Low Risk  (03/27/2024)  Transportation Needs: No Transportation Needs (03/27/2024)  Utilities: Not At Risk (03/27/2024)  Financial Resource Strain: Low Risk  (12/26/2023)   Received from [facility]  Physical Activity: Unknown (12/26/2023)   Received from [facility]  Social Connections: Patient Declined (03/27/2024)  Stress: No Stress Concern Present (12/26/2023)   Received from [facility]  Tobacco Use: Low Risk  (03/31/2024)     Readmission Risk Interventions    03/27/2024    3:11 PM  Readmission Risk Prevention Plan  Transportation Screening Complete  PCP or Specialist Appt within 5-7 Days Complete  Home Care Screening Complete  Medication Review (RN CM) Referral to Pharmacy

## 2024-04-02 NOTE — Progress Notes (Signed)
 Physical Therapy Treatment Patient Details Name: Aaron Marquez MRN: [id number] DOB: 17-Dec-1955 Today's Date: 04/02/2024   History of Present Illness Pt is a 68 y/o M admitted on 03/26/24 after presenting with c/o confusion, increasing upper back pain & LUE pain.  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 S/p decompressive thoracic laminectomy microdiscectomy T11-12 8/25. PMH: HTN, gout, lumbar hernia, GERD, back pain    PT Comments  The pt is making gradual functional progress despite continuing to be limited by back pain. He only required modAx1 for bed mobility and sit to stand transfers and minAx1 to step pivot with RW support today. He continues to display deficits in cognition, strength, and power that impact his ability to initiate mobility and make him reliant on the therapist to cue him to sequence mobility and assist him though. While sitting in the chair performing dynamic tasks, pt did not recall just washing his face, demonstrating STM deficits. He remains at high risk for falls. Will continue to follow acutely.     If plan is discharge home, recommend the following: Two people to help with walking and/or transfers;Two people to help with bathing/dressing/bathroom;Assistance with cooking/housework;Direct supervision/assist for medications management;Direct supervision/assist for financial management;Assist for transportation;Help with stairs or ramp for entrance;Supervision due to cognitive status   Can travel by private vehicle     No  Equipment Recommendations  Rolling walker (2 wheels);BSC/3in1;Wheelchair (measurements PT);Wheelchair cushion (measurements PT);Hospital bed (pending progress)    Recommendations for Other Services       Precautions / Restrictions Precautions Precautions: Fall;Back Precaution Booklet Issued: No Recall of Precautions/Restrictions: Impaired Precaution/Restrictions Comments: reviewed spinal  precautions Required Braces or Orthoses:  (no orders for brace; Orders for Mobility Protocol: No Restrictions in place; Dr. Cherlyn to check with NSGY about back brace needs, awaiting response) Restrictions Weight Bearing Restrictions Per Provider Order: No     Mobility  Bed Mobility Overal bed mobility: Needs Assistance Bed Mobility: Sidelying to Sit, Sit to Sidelying, Rolling Rolling: Mod assist Sidelying to sit: HOB elevated, Mod assist     Sit to sidelying: Mod assist General bed mobility comments: Cued pt to flex legs and reach L UE towards therapist's hand to his R to roll like a log, modA needed to rotate trunk. Cues provided to bring legs off R EOB, minA needed to complete. ModA then needed at trunk to ascend and sit up R EOB with L HHA to pull up to sit. ModA needed to lift legs while pt descended to his R elbow to lay back down.    Transfers Overall transfer level: Needs assistance Equipment used: Rolling walker (2 wheels) Transfers: Sit to/from Stand, Bed to chair/wheelchair/BSC Sit to Stand: Mod assist   Step pivot transfers: Min assist      Lateral/Scoot Transfers: Supervision General transfer comment: Pt needed extra time and a count to 3 to cue him to initiate transfers to stand from sitting. ModA needed to power up to stand and gain balance, x2 reps from EOB and x1 rep from chair. MinA needed for balance to step pivot bed <> chair with RW support. Supervision to scoot to R along EOB x2 reps    Ambulation/Gait Ambulation/Gait assistance: Min assist Gait Distance (Feet): 2 Feet (x2 bouts of ~2 ft each bout) Assistive device: Rolling walker (2 wheels) Gait Pattern/deviations: Step-to pattern, Decreased step length - right, Decreased step length - left, Decreased stride length Gait velocity: reduced Gait velocity interpretation: <1.31  ft/sec, indicative of household ambulator   General Gait Details: Pt demonstrated better weight shifting and stepping initiation this  date, step pivoting bed <> chair with RW support and minA for balance.   Stairs             Wheelchair Mobility     Tilt Bed    Modified Rankin (Stroke Patients Only)       Balance Overall balance assessment: Needs assistance Sitting-balance support: Bilateral upper extremity supported, Feet supported Sitting balance-Leahy Scale: Poor Sitting balance - Comments: Pt tends to lean posteriorly due to back pain, preferring minA posteriorly for support to relieve the pain, but pt capable of sitting statically with UE support on the bed with CGA for safety only. Pt sat in chair with back support and performed dynamic sitting tasks without UE support with supervision for safety, x > 10 min   Standing balance support: Bilateral upper extremity supported, During functional activity, Reliant on assistive device for balance, Single extremity supported Standing balance-Leahy Scale: Poor Standing balance comment: reliant on at least 1 UE support and minA, able to reach off COG minimally to perform standing tasks with 1 UE support and minA while feet statically placed                            Communication Communication Communication: No apparent difficulties  Cognition Arousal: Alert Behavior During Therapy: Flat affect   PT - Cognitive impairments: Awareness, Attention, Initiation, Sequencing, Problem solving, Safety/Judgement, Memory                       PT - Cognition Comments: Pt with flat affect and delayed processing and initiation. Pt needed extra time and step-by-step cues to sequence and perform all mobility. Pt forgetting whether he just washed his face or not. Needs cues for spinal precautions compliance Following commands: Impaired Following commands impaired: Follows one step commands with increased time    Cueing Cueing Techniques: Verbal cues, Tactile cues, Gestural cues  Exercises      General Comments        Pertinent Vitals/Pain Pain  Assessment Pain Assessment: Faces Faces Pain Scale: Hurts even more Pain Location: back, nack Pain Descriptors / Indicators: Discomfort, Grimacing, Guarding, Operative site guarding, Moaning Pain Intervention(s): Limited activity within patient's tolerance, Monitored during session, Repositioned, Patient requesting pain meds-RN notified, RN gave pain meds during session, Heat applied (heat applied to neck only)    Home Living                          Prior Function            PT Goals (current goals can now be found in the care plan section) Acute Rehab PT Goals Patient Stated Goal: to reduce pain PT Goal Formulation: With patient Time For Goal Achievement: 04/15/24 Potential to Achieve Goals: Good Progress towards PT goals: Progressing toward goals    Frequency    Min 5X/week      PT Plan      Co-evaluation              AM-PAC PT 6 Clicks Mobility   Outcome Measure  Help needed turning from your back to your side while in a flat bed without using bedrails?: A Lot Help needed moving from lying on your back to sitting on the side of a flat bed without using bedrails?: A Lot  Help needed moving to and from a bed to a chair (including a wheelchair)?: A Little Help needed standing up from a chair using your arms (e.g., wheelchair or bedside chair)?: A Lot Help needed to walk in hospital room?: Total Help needed climbing 3-5 steps with a railing? : Total 6 Click Score: 11    End of Session Equipment Utilized During Treatment: Gait belt Activity Tolerance: Patient limited by pain Patient left: in bed;with call bell/phone within reach;with bed alarm set Nurse Communication: Mobility status;Patient requests pain meds PT Visit Diagnosis: Pain;Difficulty in walking, not elsewhere classified (R26.2);Unsteadiness on feet (R26.81);Other abnormalities of gait and mobility (R26.89);Muscle weakness (generalized) (M62.81) Pain - Right/Left:  (back) Pain - part of  body:  (back)     Time: 8594-8548 PT Time Calculation (min) (ACUTE ONLY): 46 min  Charges:    $Therapeutic Activity: 38-52 mins PT General Charges $$ ACUTE PT VISIT: 1 Visit                     Theo Ferretti, PT, DPT Acute Rehabilitation Services  Office: [phone]    Theo CHRISTELLA Ferretti 04/02/2024, 4:37 PM

## 2024-04-02 NOTE — Discharge Summary (Addendum)
 Physician Discharge Summary   Patient: Aaron Marquez MRN: [id number] DOB: [birth year]  Admit date:     03/26/2024  Discharge date: 04/02/24  Discharge Physician: Aaron Marquez   PCP: Marquez, Aaron M, FNP   Recommendations at discharge:    Follow up with your PCP In one week Follow up out patient with neurosurgery Aaron Aaron in 2-3 weeks. Call his office to make an appointment.  Discharge Diagnoses: Principal Problem:   Altered mental status Active Problems:   Resting tremor   HLD (hyperlipidemia)   CKD stage 3b, GFR 30-44 ml/min (HCC)   Back pain   Upper back pain   History of gout   Essential hypertension   HNP (herniated nucleus pulposus with myelopathy), thoracic   Hospital Course:    69 y.o. male with history of hypertension, gout, back pain who had epidural injection in the low back about 2 weeks ago was brought to the ER after patient was found to be confused with increasing pain in the upper back and also complained of some left arm pain. MRI T spine showed Left posterolateral disc protrusion at T11-12 impressing upon the ventral surface of the spinal cord and causing moderate left-sided spinal canal stenosis. Minimal posterior disc bulging at T3-4, T5-6, T6-7, T7-8, T8-9, T9-10, and T10-11 with very mild central spinal canal stenosis at each level, without impingement upon the spinal cord or nerve roots. MRI brain without contrast is negative for acute pathology.  Neurosurgery consulted and he underwent decompressive thoracic laminectomy, microdiscectomy at T11 T12 on 8/25. He is doing well postoperatively. Therapy eval recommended SNF.  Follow up with Aaron Marquez in 2-3 weeks. Call his office to make an appointment. Prn analgesics.      Consultants: Neurosurgery Procedures performed:  Left-sided thoracic 11-12 laminectomy with transpedicular discectomy with microscopic discectomy and microscopic foraminotomies of the T11-T12 nerve roots from the left.   Disposition:  Skilled nursing facility Diet recommendation:  Regular diet DISCHARGE MEDICATION: Allergies as of 04/02/2024       Reactions   Nsaids Other (See Comments)   History of CKD limits potential use   Zestril  [lisinopril ] Nausea Only, Other (See Comments)   Gagging secondary to tingling in throat        Medication List     TAKE these medications    acetaminophen  325 MG tablet Commonly known as: TYLENOL  Take 2 tablets (650 mg total) by mouth every 6 (six) hours as needed for mild pain (pain score 1-3) (or Fever >/= 101).   allopurinol  100 MG tablet Commonly known as: ZYLOPRIM  Take 100 mg by mouth daily.   amLODipine  10 MG tablet Commonly known as: NORVASC  Take 10 mg by mouth daily.   aspirin  81 MG tablet Take 81 mg by mouth daily.   atorvastatin  40 MG tablet Commonly known as: LIPITOR Take 40 mg by mouth daily.   carbidopa -levodopa  25-100 MG tablet Commonly known as: SINEMET  IR Take 1 tablet by mouth 3 (three) times daily.   HYDROcodone -acetaminophen  5-325 MG tablet Commonly known as: NORCO/VICODIN Take 1 tablet by mouth every 6 (six) hours as needed for severe pain (pain score 7-10). What changed:  how much to take reasons to take this   lidocaine  5 % Commonly known as: LIDODERM  Place 1 patch onto the skin daily. Remove & Discard patch within 12 hours or as directed by MD   methocarbamol  500 MG tablet Commonly known as: ROBAXIN  Take 1 tablet (500 mg total) by mouth every 8 (eight) hours as needed.  metoprolol  succinate 50 MG 24 hr tablet Commonly known as: TOPROL -XL Take 50 mg by mouth daily. Take with or immediately following a meal.   pantoprazole  40 MG tablet Commonly known as: PROTONIX  Take 40 mg by mouth daily.   telmisartan 20 MG tablet Commonly known as: MICARDIS Take 20 mg by mouth daily.        Follow-up Information     Marquez, Aaron M, FNP. Schedule an appointment as soon as possible for a visit in 1 week(s).   Specialty: Family  Medicine Contact information: [address] [place] [address] [phone]         Aaron Kuba, MD. Schedule an appointment as soon as possible for a visit in 2 week(s).   Specialty: Neurosurgery Contact information: [address] [place] [address] [phone]                Discharge Exam: Aaron Marquez   03/26/24 1946 03/31/24 1212  Weight: 120.2 kg 120.2 kg   Constitutional: NAD, calm, comfortable Eyes: PERRL, lids and conjunctivae normal ENMT: Mucous membranes are moist. Posterior pharynx clear of any exudate or lesions.Normal dentition.  Neck: normal, supple, no masses, no thyromegaly Respiratory: clear to auscultation bilaterally, no wheezing, no crackles. Normal respiratory effort. No accessory muscle use.  Cardiovascular: Regular rate and rhythm, no murmurs / rubs / gallops. No extremity edema. 2+ pedal pulses. No carotid bruits.  Abdomen: no tenderness, no masses palpated. No hepatosplenomegaly. Bowel sounds positive.  Musculoskeletal: no clubbing / cyanosis. No joint deformity upper and lower extremities. Good ROM, no contractures. Normal muscle tone.  Skin: no rashes, lesions, ulcers. No induration Neurologic: CN 2-12 grossly intact. Sensation intact, DTR normal. Strength 5/5 x all 4 extremities.  Psychiatric: Normal judgment and insight. Alert and oriented x 3. Normal mood.    Condition at discharge: good  The results of significant diagnostics from this hospitalization (including imaging, microbiology, ancillary and laboratory) are listed below for reference.   Imaging Studies: DG Lumbar Spine 2-3 Views Result Date: 03/31/2024 CLINICAL DATA:  Elective surgery. EXAM: LUMBAR SPINE - 2-3 VIEW COMPARISON:  CT 02/04/2024 FINDINGS: Two portable cross-table lateral views of the lumbar spine submitted from the operating Marquez. Image 1 demonstrates surgical instrument localizing posteriorly at the T11-T12 level. Image 2  demonstrates surgical instruments projecting over the posterior elements at the T11-T12 level. IMPRESSION: Intraoperative localization at T11-T12. Electronically Signed   By: Aaron Marquez M.D.   On: 03/31/2024 17:31   EEG adult Result Date: 03/27/2024 Shelton Arlin KIDD, MD     03/27/2024  5:32 PM Patient Name: Avett Reineck MRN: [id number] Epilepsy Attending: Arlin KIDD Shelton Referring Physician/Provider: Franky Redia SAILOR, MD Date: 03/27/2024 Duration: 29.21 mins Patient history: 68yo M with ams. EEG to evaluate for seizure Level of alertness: Awake AEDs during EEG study: None Technical aspects: This EEG study was done with scalp electrodes positioned according to the 10-20 International system of electrode placement. Electrical activity was reviewed with band pass filter of 1-70Hz , sensitivity of 7 uV/mm, display speed of 101mm/sec with a 60Hz  notched filter applied as appropriate. EEG data were recorded continuously and digitally stored.  Video monitoring was available and reviewed as appropriate. Description: The posterior dominant rhythm consists of 7.5Hz  activity of moderate voltage (25-35 uV) seen predominantly in posterior head regions, symmetric and reactive to eye opening and eye closing. Hyperventilation and photic stimulation were not performed.   IMPRESSION: This study is within normal limits. No seizures or  epileptiform discharges were seen throughout the recording. A normal interictal EEG does not exclude the diagnosis of epilepsy. Arlin MALVA Krebs   MR THORACIC SPINE WO CONTRAST Result Date: 03/27/2024 EXAM: MRI THORACIC SPINE WITHOUT INTRAVENOUS CONTRAST 03/27/2024 05:52:46 AM TECHNIQUE: Multiplanar multisequence MRI of the thoracic spine was performed without the administration of intravenous contrast. COMPARISON: None available. CLINICAL HISTORY: Mid-back pain, infection suspected, positive xray/CT. FINDINGS: BONES AND ALIGNMENT: Normal alignment. Normal vertebral body heights. Bone  marrow signal is unremarkable. No abnormal enhancement. SPINAL CORD: Normal spinal cord volume. Normal spinal cord signal. SOFT TISSUES: Unremarkable. DEGENERATIVE CHANGES: There is a left posterolateral disc protrusion at T11-12, which is impressing upon the ventral surface of the spinal cord and causing moderate left-sided spinal canal stenosis. The protruded disc measures approximately 6 mm in AP dimension and 13 mm in transverse dimension and 19 mm in Craniocaudad length. There is minimal posterior disc bulging also present at T3-4, T5-6, T6-7, T7-8, T8-9, T9-10 and T10-11, with very mild central spinal canal stenosis at each level, but no impingement upon the spinal cord or nerve roots. IMPRESSION: 1. Left posterolateral disc protrusion at T11-12 impressing upon the ventral surface of the spinal cord and causing moderate left-sided spinal canal stenosis. 2. Minimal posterior disc bulging at T3-4, T5-6, T6-7, T7-8, T8-9, T9-10, and T10-11 with very mild central spinal canal stenosis at each level, without impingement upon the spinal cord or nerve roots. Electronically signed by: Evalene Coho MD 03/27/2024 06:22 AM EDT RP Workstation: [id number]   MR CERVICAL SPINE WO CONTRAST Result Date: 03/27/2024 EXAM: MRI CERVICAL SPINE WITHOUT CONTRAST 03/27/2024 05:52:29 AM TECHNIQUE: Multiplanar multisequence MRI of the cervical spine was performed. COMPARISON: Cervical spine series dated 11/17/2023. CLINICAL HISTORY: Myelopathy, acute, cervical spine. FINDINGS: BONES AND ALIGNMENT: Reversal of the normal cervical lordosis. Normal vertebral body heights. Bone marrow signal is unremarkable. SPINAL CORD: Normal spinal cord size. No abnormal spinal cord signal. SOFT TISSUES: No paraspinal mass. C2-C3: Disc space narrowing. No significant disc herniation. No spinal canal stenosis or neural foraminal narrowing. C3-C4: Disc space narrowing and endplate ridging causing mild central spinal canal stenosis and  mild-to-moderate left neural foraminal stenosis. C4-C5: Diffuse endplate ridging causing mild-to-moderate central spinal canal stenosis and mild-to-moderate bilateral neural foraminal stenosis. C5-C6: Mild central spinal canal stenosis and moderate left neural foraminal stenosis. No significant disc herniation. C6-C7: Diffuse disc bulging and endplate ridging with mild central spinal canal stenosis and mild-to-moderate left neural foraminal stenosis. C7-T1: No significant disc herniation. No spinal canal stenosis or neural foraminal narrowing. IMPRESSION: 1. Reversal of the normal cervical lordosis. 2. Disc space narrowing at C2-3 and C3-4. 3. Mild central spinal canal stenosis at C3-4, C5-6, and C6-7. 4. Mild-to-moderate left neural foraminal stenosis at C3-4 and C6-7. 5. Mild-to-moderate bilateral neural foraminal stenosis at C4-5. 6. Moderate left neural foraminal stenosis at C5-6. Electronically signed by: Evalene Coho MD 03/27/2024 06:15 AM EDT RP Workstation: [id number]   MR BRAIN WO CONTRAST Result Date: 03/27/2024 EXAM: MRI BRAIN WITHOUT CONTRAST 03/27/2024 05:52:03 AM TECHNIQUE: Multiplanar multisequence MRI of the head/brain was performed without the administration of intravenous contrast. COMPARISON: CT of the head dated 03/28/2024. CLINICAL HISTORY: Neuro deficit, acute, stroke suspected. FINDINGS: BRAIN AND VENTRICLES: No convincing evidence of restricted diffusion. No acute infarct. No intracranial hemorrhage. No mass. No midline shift. No hydrocephalus. The sella is unremarkable. Normal flow voids. ORBITS: The patient is status post bilateral lens replacement. No acute abnormality. SINUSES AND MASTOIDS: There is mild mucosal disease within the floor of  the maxillary sinuses. No acute abnormality. BONES AND SOFT TISSUES: Normal marrow signal. No acute soft tissue abnormality. IMPRESSION: 1. No acute intracranial abnormality. 2. Mild mucosal disease within the floor of the maxillary sinuses.  Electronically signed by: Evalene Coho MD 03/27/2024 06:12 AM EDT RP Workstation: [id number]   CT Head Wo Contrast Result Date: 03/26/2024 CLINICAL DATA:  Mental status change, unknown cause EXAM: CT HEAD WITHOUT CONTRAST TECHNIQUE: Contiguous axial images were obtained from the base of the skull through the vertex without intravenous contrast. RADIATION DOSE REDUCTION: This exam was performed according to the departmental dose-optimization program which includes automated exposure control, adjustment of the mA and/or kV according to patient size and/or use of iterative reconstruction technique. COMPARISON:  04/30/2023 FINDINGS: Brain: No acute intracranial abnormality. Specifically, no hemorrhage, hydrocephalus, mass lesion, acute infarction, or significant intracranial injury. Vascular: No hyperdense vessel or unexpected calcification. Skull: No acute calvarial abnormality. Sinuses/Orbits: No acute findings Other: None IMPRESSION: No acute intracranial abnormality. Electronically Signed   By: Franky Crease M.D.   On: 03/26/2024 20:22    Microbiology: Results for orders placed or performed during the hospital encounter of 03/26/24  Surgical pcr screen     Status: None   Collection Time: 03/31/24  9:21 AM   Specimen: Nasal Mucosa; Nasal Swab  Result Value Ref Range Status   MRSA, PCR NEGATIVE NEGATIVE Final   Staphylococcus aureus NEGATIVE NEGATIVE Final    Comment: (NOTE) The Xpert SA Assay (FDA approved for NASAL specimens in patients 47 years of age and older), is one component of a comprehensive surveillance program. It is not intended to diagnose infection nor to guide or monitor treatment. Performed at [facility], [address] [place], [place] [address]     Labs: CBC: Recent Labs  Lab 03/26/24 1954 03/27/24 0726 04/01/24 1458  WBC 4.6 3.7* 12.9*  NEUTROABS 2.3 1.9 9.3*  HGB 16.6 15.3 13.7  HCT 49.5 45.4 41.4  MCV 78.8* 78.3* 79.3*  PLT 169 141* 165   Basic  Metabolic Panel: Recent Labs  Lab 03/26/24 1954 03/27/24 0726 04/01/24 1458  NA 139 140 132*  K 4.2 3.7 4.1  CL 104 105 101  CO2 23 23 22   GLUCOSE 98 98 123*  BUN 21 19 40*  CREATININE 1.89* 1.73* 1.83*  CALCIUM  9.0 8.5* 7.8*   Liver Function Tests: Recent Labs  Lab 03/26/24 1954 03/27/24 0726  AST 20 15  ALT 17 7  ALKPHOS 77 60  BILITOT 0.6 1.1  PROT 6.7 6.0*  ALBUMIN  4.1 3.2*   CBG: Recent Labs  Lab 03/26/24 1944  GLUCAP 85    Discharge time spent: 44 minutes.  Signed: Deliliah Room, MD [facility] 04/02/2024

## 2024-06-19 ENCOUNTER — Ambulatory Visit (INDEPENDENT_AMBULATORY_CARE_PROVIDER_SITE_OTHER): Admitting: Neurology

## 2024-06-19 ENCOUNTER — Encounter: Payer: Self-pay | Admitting: Neurology

## 2024-06-19 VITALS — BP 162/87 | HR 81 | Ht 73.0 in | Wt 262.0 lb

## 2024-06-19 DIAGNOSIS — G20C Parkinsonism, unspecified: Secondary | ICD-10-CM

## 2024-06-19 DIAGNOSIS — G319 Degenerative disease of nervous system, unspecified: Secondary | ICD-10-CM | POA: Diagnosis not present

## 2024-06-19 DIAGNOSIS — R251 Tremor, unspecified: Secondary | ICD-10-CM

## 2024-06-19 DIAGNOSIS — G252 Other specified forms of tremor: Secondary | ICD-10-CM | POA: Diagnosis not present

## 2024-06-19 DIAGNOSIS — I6783 Posterior reversible encephalopathy syndrome: Secondary | ICD-10-CM

## 2024-06-19 NOTE — Patient Instructions (Addendum)
 DAT scan  ordered to see if there is dopamine abnormal distribution.   68 y.o. year old male  here with:     1) worsening left side tremor and rigor.   More Masked face , loss of arm swing.   This time we see cog- wheeling on the left    No RLS and no loss of smell.  Normal Apetite.      ORDERED DAT  scan today .   He failed last years trial of 25/ 100 mg tid po for parkinsonism.      2) chronic diseases underlying.   Status post fairly recent back surgery and still feeling weak.  He has back pain for over 14 years-      RV depending on DAT scan outcome.  I will schedule for a 4 months Rv    A DaTscan is a diagnostic imaging test that uses a radio- tracer to help doctors evaluate dopamine transporters in the brain, aiding in the diagnosis of conditions like Parkinson's disease. By visualizing the levels of dopamine-producing cells, the scan can help distinguish between movement disorders such as Parkinsonism and essential tremor.  The procedure involves an injection of a radiopharmaceutical and a SPECT scan

## 2024-06-19 NOTE — Progress Notes (Addendum)
 Provider:  Dedra Gores, MD  Primary Care Physician:  Naomi Hitch, MD [address] [place] [address]     Referring Provider: [name], Fnp [address]  [place] [address]          Chief Complaint according to patient   Patient presents with:                HISTORY OF PRESENT ILLNESS:  Aaron Marquez is a 68 y.o. male patient who is here for revisit 06/19/2024 for  Tremor. I had seen this patient last year once for PRES.  I found him cognitively impaired, slow and rigid, staring.  I ordered a bunch of labs, ATN was positive for p tau only, and his creatinine was high at 1.93.  GFR was 38. Hba1c 5.8. normal TSH. Normal s- PEP.  sI had found him severely neuro-cognitively impaired, slowed, masked face, resting tremor in the left hand, the non-dominant site. MOCA 18/ 30 points. MRI - no stroke or major atrophy, reportedly PRS was suspected in 09-3032 at [facility]. CT angio normal   When we parted I had him try sinemet  for trial- it did not help the patient.    Chief concern according to patient :  wife and patient report his tremor has become higher amplitude and only affects the left hand he has improved with his memory they feel.   Tremogram  documented left tremor.  He has rigor in the left arm , biceps.  He has been weaker in the left hip,( flexion.) and moves slower.  Interval history L back pain in June 2025,seen in ED- and again ED visit 03-26-2024 after epidural injections, and underwent  lumbar laminectomy by arley Helling, MD  03-31-2024.    Fam Hx : see previous note  Social HX; see previous note    CT of the head dated 03/28/2024.   CLINICAL HISTORY: MRI 03-30-2024  Neuro deficit, acute, stroke suspected.   FINDINGS:   BRAIN AND VENTRICLES: No convincing evidence of restricted diffusion. No acute infarct. No intracranial hemorrhage. No mass. No midline shift. No hydrocephalus. The sella is unremarkable. Normal flow  voids.   ORBITS: The patient is status post bilateral lens replacement. No acute abnormality.   SINUSES AND MASTOIDS: There is mild mucosal disease within the floor of the maxillary sinuses. No acute abnormality.   BONES AND SOFT TISSUES: Normal marrow signal. No acute soft tissue abnormality.   IMPRESSION: 1. No acute intracranial abnormality. 2. Mild mucosal disease within the floor of the maxillary sinuses.  Last Year :  Aaron Marquez is a 68 y.o. male patient who is seen upon referral on 06/21/2023 from [place] Primary care provider : for unspecified  memory concern. There was no previous MMSE documented -it is unclear what cognitive areas/  functions are impaired and how independent this patient is in daily life.   This patient has an extensive medical record that I reviewed over the last 2 days.  He is referred for memory changes but there has been an acute change overall in his cognitive function his ability to retain information since February of this year.  His wife ,Gertie , accompanied the patient today to this visit.   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  She reports that he shuffles ongoing for  years - due to long standing history of  back pain and back surgeries that have not helped. Chronic pain.     Theyre have been short-term memory struggles ongoing:  forgetting where certain objects have been placed, sometimes repeating questions over and over, and she is also reporting that he still has some pressure headaches on the top of his head.  Sometimes she states that his hands shake at rest- left more than right .   All these symptoms were supposingly not present prior to the fall. Several months ago, he underwent cataract surgery  and  hasn't been seeing well since . He has disposed of his glasses and now is unable to read !    Review of Systems: Out of a complete 14 system review, the patient complains of only the following symptoms, and all other reviewed systems are negative.:   Tremor, left side only, bilateral  elevated biceps tone, gait is slowed.       Social History   Socioeconomic History   Marital status: Married    Spouse name: Not on file   Number of children: Not on file   Years of education: Not on file   Highest education level: Not on file  Occupational History   Not on file  Tobacco Use   Smoking status: Never   Smokeless tobacco: Never  Vaping Use   Vaping status: Never Used  Substance and Sexual Activity   Alcohol use: No   Drug use: No   Sexual activity: Not on file  Other Topics Concern   Not on file  Social History Narrative   Pt lives with family    Retired    Social Drivers of Corporate Investment Banker Strain: Low Risk  (12/26/2023)   Received from [facility]   Overall Financial Resource Strain (CARDIA)    Difficulty of Paying Living Expenses: Not very hard  Food Insecurity: No Food Insecurity (03/27/2024)   Hunger Vital Sign    Worried About Running Out of Food in the Last Year: Never true    Ran Out of Food in the Last Year: Never true  Transportation Needs: No Transportation Needs (03/27/2024)   PRAPARE - Administrator, Civil Service (Medical): No    Lack of Transportation (Non-Medical): No  Physical Activity: Unknown (12/26/2023)   Received from [facility]   Exercise Vital Sign    On average, how many days per week do you engage in moderate to strenuous exercise (like a brisk walk)?: 0 days    Minutes of Exercise per Session: Not on file  Stress: No Stress Concern Present (12/26/2023)   Received from [facility] - Occupational Stress Questionnaire    Feeling of Stress : Only a little  Social  Connections: Patient Declined (03/27/2024)   Social Connection and Isolation Panel    Frequency of Communication with Friends and Family: Patient declined    Frequency of Social Gatherings with Friends and Family: Patient declined    Attends Religious Services: Patient declined    Database Administrator or Organizations: Patient declined    Attends Banker Meetings: Patient declined    Marital Status: Patient declined    Family History  Problem Relation Age of Onset   Parkinson's disease Neg Hx  Past Medical History:  Diagnosis Date   Back pain    GERD (gastroesophageal reflux disease)    Gout    Hypertension    Lumbar hernia    Pneumonia 07/31/12    Past Surgical History:  Procedure Laterality Date   BACK SURGERY     DECOMPRESSIVE LUMBAR LAMINECTOMY LEVEL 1 N/A 03/31/2024   Procedure: DECOMPRESSIVE THORACIC LAMINECTOMY THORACIC ELEVEN-TWELVE;  Surgeon: Onetha Kuba, MD;  Location: [facility] OR;  Service: Neurosurgery;  Laterality: N/A;   FERTILITY SURGERY     stent   LUMBAR LAMINECTOMY/DECOMPRESSION MICRODISCECTOMY Right 01/01/2013   Procedure: LUMBAR LAMINECTOMY/DECOMPRESSION MICRODISCECTOMY 1 LEVEL;  Surgeon: Kuba SHAUNNA Onetha, MD;  Location: [facility];  Service: Neurosurgery;  Laterality: Right;  Lumbar Laminectomies Lumbar Four-Five, Decompression, Discectomy      Current Outpatient Medications on File Prior to Visit  Medication Sig Dispense Refill   acetaminophen  (TYLENOL ) 325 MG tablet Take 2 tablets (650 mg total) by mouth every 6 (six) hours as needed for mild pain (pain score 1-3) (or Fever >/= 101). 20 tablet 0   allopurinol  (ZYLOPRIM ) 100 MG tablet Take 100 mg by mouth daily. (Patient taking differently: Take 100 mg by mouth as needed.)     amLODipine  (NORVASC ) 10 MG tablet Take 10 mg by mouth daily.     aspirin  81 MG tablet Take 81 mg by mouth daily.     atorvastatin  (LIPITOR) 40 MG tablet Take 40 mg by mouth daily.     carbidopa -levodopa  (SINEMET  IR) 25-100  MG tablet Take 1 tablet by mouth 3 (three) times daily. 90 tablet 2   gabapentin (NEURONTIN) 100 MG capsule      hydrALAZINE  (APRESOLINE ) 50 MG tablet Take 50 mg by mouth 2 (two) times daily.     HYDROcodone -acetaminophen  (NORCO/VICODIN) 5-325 MG tablet Take 1 tablet by mouth every 6 (six) hours as needed for severe pain (pain score 7-10). 20 tablet 0   lidocaine  (LIDODERM ) 5 % Place 1 patch onto the skin daily. Remove & Discard patch within 12 hours or as directed by MD     methocarbamol  (ROBAXIN ) 500 MG tablet Take 1 tablet (500 mg total) by mouth every 8 (eight) hours as needed. 20 tablet 0   metoprolol  succinate (TOPROL -XL) 50 MG 24 hr tablet Take 50 mg by mouth daily. Take with or immediately following a meal.     pantoprazole  (PROTONIX ) 40 MG tablet Take 40 mg by mouth daily.     telmisartan (MICARDIS) 20 MG tablet Take 20 mg by mouth daily.     telmisartan (MICARDIS) 40 MG tablet      traMADol  (ULTRAM ) 50 MG tablet Take 50 mg by mouth every 6 (six) hours as needed.     No current facility-administered medications on file prior to visit.    Allergies  Allergen Reactions   Nsaids Other (See Comments)    History of CKD limits potential use   Zestril  [Lisinopril ] Nausea Only and Other (See Comments)    Gagging secondary to tingling in throat     DIAGNOSTIC DATA (LABS, IMAGING, TESTING) - I reviewed patient records, labs, notes, testing and imaging myself where available.  Lab Results  Component Value Date   WBC 12.9 (H) 04/01/2024   HGB 13.7 04/01/2024   HCT 41.4 04/01/2024   MCV 79.3 (L) 04/01/2024   PLT 165 04/01/2024      Component Value Date/Time   NA 132 (L) 04/01/2024 1458   NA 144 06/21/2023 1524   K 4.1 04/01/2024 1458  CL 101 04/01/2024 1458   CO2 22 04/01/2024 1458   GLUCOSE 123 (H) 04/01/2024 1458   BUN 40 (H) 04/01/2024 1458   BUN 19 06/21/2023 1524   CREATININE 1.83 (H) 04/01/2024 1458   CALCIUM  7.8 (L) 04/01/2024 1458   PROT 6.0 (L) 03/27/2024 0726    PROT 6.6 06/21/2023 1524   ALBUMIN  3.2 (L) 03/27/2024 0726   ALBUMIN  4.3 06/21/2023 1524   AST 15 03/27/2024 0726   ALT 7 03/27/2024 0726   ALKPHOS 60 03/27/2024 0726   BILITOT 1.1 03/27/2024 0726   BILITOT 0.8 06/21/2023 1524   GFRNONAA 40 (L) 04/01/2024 1458   GFRAA 45 (L) 06/01/2018 0036   No results found for: CHOL, HDL, LDLCALC, LDLDIRECT, TRIG, CHOLHDL Lab Results  Component Value Date   HGBA1C 5.8 (H) 06/21/2023   Lab Results  Component Value Date   VITAMINB12 296 06/21/2023   Lab Results  Component Value Date   TSH 2.520 03/26/2024    PHYSICAL EXAM:  Vitals:   06/19/24 1542  BP: (!) 162/87  Pulse: 81   No data found. Body mass index is 34.57 kg/m.   Wt Readings from Last 3 Encounters:  06/19/24 262 lb (118.8 kg)  03/31/24 264 lb 15.9 oz (120.2 kg)  02/04/24 264 lb (119.7 kg)     Ht Readings from Last 3 Encounters:  06/19/24 6' 1 (1.854 m)  03/31/24 6' 1 (1.854 m)  02/04/24 5' 11 (1.803 m)      General: The patient is awake, alert and appears not in acute distress and groomed. Head: Normocephalic, atraumatic.  Neck is supple. Speech is slow-fluent, without cadence, dysphonia - monotonous,  Mood and affect are aloof.    Cranial nerves: Pupils are equal post cataract un- reactive to light.  He had cataract surgery,   Lazy eye on the right  .  Extraocular movements in vertical and horizontal planes were intact and without nystagmus.  No Diplopia. Visual fields by finger perimetry are intact. Hearing was intact to soft voice and finger rubbing.    Facial sensation intact to fine touch.   Facial motor strength is symmetric and tongue and uvula move midline.  Again, masked face  Neck ROM : rotation, tilt and flexion extension were normal for age and shoulder shrug was symmetrical.    Motor exam:  Symmetric bulk Elevated waxy tone with mild cog wheeling,  left biceps more than right -  symmetric  grip strength .   Sensory:  Fine  touch,and vibration were reduced at the ankles, there was loss of propio-ception,  pronator drift.    Coordination: Alternating movements in the fingers/hands were  slowed    The Finger-to-nose maneuver was so slow (!) but  without evidence of ataxia, dysmetria or tremor.   Gait and station:  walks fluently and turned with 5 steps, left hand severe tremor, no arm swing either side.  Deep tendon reflexes: in the  upper and lower extremities are symmetric and intact.  Babinski response was deferred.    ASSESSMENT AND PLAN :   68 y.o. year old male  here with:    1) worsening left side tremor and rigor.   More Masked face , loss of arm swing.   This time we see cog- wheeling on the left   No RLS and no loss of smell.  Normal Apetite.     ORDERED DAT  scan today .  He failed last years trial of 25/ 100 mg tid po for parkinsonism.  Principal Problem:   Altered mental status 03-30-2024,  resolved with pain treatment.   Active Problems:   Resting tremor   HLD (hyperlipidemia)   CKD stage 3b, GFR 30-44 ml/min (HCC)   Back pain   Upper back pain   History of gout   Essential hypertension   HNP (herniated nucleus pulposus with myelopathy), thoracic    2) chronic diseases underlying.   Status post fairly recent back surgery and still feeling weak.    RV depending on DAT scan outcome.    I would like to thank  [name], Fnp [address] [facility] [address],  [place] [address] for allowing me to meet with this pleasant patient.    The patient will be seen in follow-up in the sleep clinic at [facility] for discussion of test results, sleep related symptoms and treatment compliance review, further management strategies, etc.   The referring provider will be notified of the test results.   The patient's condition requires frequent monitoring and adjustments in the treatment plan, reflecting the ongoing complexity of care.  This provider is the continuing focal point for all needed  services for this condition.  After spending a total time of  35  minutes face to face and time for  history taking, physical and neurologic examination, review of laboratory studies,  personal review of imaging studies, reports and results of other testing and review of referral information / records as far as provided in visit,   Electronically signed by: Dedra Gores, MD 06/19/2024 4:00 PM  [facility] Board certified by The [employer] of Sleep Medicine and Diplomate of the [employer] of Sleep Medicine. Board certified In Neurology through the ABPN, Fellow of the [employer] of Neu[employer].

## 2024-07-09 ENCOUNTER — Encounter (HOSPITAL_COMMUNITY): Payer: Self-pay | Admitting: Diagnostic Radiology

## 2024-07-18 ENCOUNTER — Encounter (HOSPITAL_COMMUNITY): Payer: Self-pay

## 2024-07-18 ENCOUNTER — Encounter (HOSPITAL_COMMUNITY)
Admission: RE | Admit: 2024-07-18 | Discharge: 2024-07-18 | Disposition: A | Discharge status: Home / Self Care | Source: Ambulatory Visit | Attending: Neurology | Admitting: Neurology

## 2024-07-18 ENCOUNTER — Encounter (HOSPITAL_COMMUNITY): Admission: RE | Admit: 2024-07-18 | Discharge: 2024-07-18 | Attending: Neurology | Admitting: Neurology

## 2024-07-18 DIAGNOSIS — G20C Parkinsonism, unspecified: Secondary | ICD-10-CM

## 2024-07-18 DIAGNOSIS — G252 Other specified forms of tremor: Secondary | ICD-10-CM | POA: Diagnosis present

## 2024-07-18 DIAGNOSIS — I6783 Posterior reversible encephalopathy syndrome: Secondary | ICD-10-CM

## 2024-07-18 DIAGNOSIS — R251 Tremor, unspecified: Secondary | ICD-10-CM

## 2024-07-18 DIAGNOSIS — G319 Degenerative disease of nervous system, unspecified: Secondary | ICD-10-CM | POA: Diagnosis present

## 2024-07-18 MED ORDER — IOFLUPANE I 123 185 MBQ/2.5ML IV SOLN
5.0000 | Freq: Once | INTRAVENOUS | Status: AC
  Administered 2024-07-18: 4.71 via INTRAVENOUS

## 2024-07-18 MED ORDER — POTASSIUM IODIDE (ANTIDOTE) 130 MG PO TABS
ORAL_TABLET | ORAL | Status: AC
  Filled 2024-07-18: qty 1

## 2024-07-19 ENCOUNTER — Ambulatory Visit: Payer: Self-pay | Admitting: Neurology

## 2024-07-21 ENCOUNTER — Telehealth: Payer: Self-pay | Admitting: Neurology

## 2024-07-21 NOTE — Telephone Encounter (Signed)
 Pt's wife called again wanting to know if a nurse can explain a little better the DatScan  results.

## 2024-07-21 NOTE — Telephone Encounter (Signed)
 Wife asking for a call with an explanation of results to recent testing

## 2024-07-22 NOTE — Telephone Encounter (Signed)
 Pt's wife called office. We discussed the DaTscan  results showing correlation with PD. Patient's wife will discuss further at OV with Dr Chalice. She accepted an appt on 08/05/24 @ 11:30 am arrive 11:15. She was appreciative.

## 2024-07-22 NOTE — Telephone Encounter (Signed)
 Work-in per Dr Chalice. Spoke with wife. See other result phone note.

## 2024-07-24 ENCOUNTER — Encounter: Payer: Self-pay | Admitting: Psychology

## 2024-08-05 ENCOUNTER — Ambulatory Visit: Admitting: Neurology

## 2024-08-05 ENCOUNTER — Encounter: Payer: Self-pay | Admitting: Neurology

## 2024-08-05 VITALS — BP 132/78 | HR 89 | Ht 72.0 in | Wt 264.0 lb

## 2024-08-05 DIAGNOSIS — H539 Unspecified visual disturbance: Secondary | ICD-10-CM

## 2024-08-05 DIAGNOSIS — G319 Degenerative disease of nervous system, unspecified: Secondary | ICD-10-CM | POA: Diagnosis not present

## 2024-08-05 DIAGNOSIS — G252 Other specified forms of tremor: Secondary | ICD-10-CM | POA: Diagnosis not present

## 2024-08-05 MED ORDER — CARBIDOPA-LEVODOPA 25-100 MG PO TABS: 1.0000 | ORAL_TABLET | Freq: Three times a day (TID) | ORAL | 2 refills | Status: AC

## 2024-08-05 NOTE — Progress Notes (Addendum)
 "        Provider:  Dedra Gores, MD  Primary Care Physician:  Naomi Hitch, MD [address] [place] [address]     Referring Provider: [place] Hitch, Md [address] [place],  [place] [address]          Chief Complaint according to patient   Patient presents with:          Abnormal DATSCAN  demonstrating decreased radiotracer uptake in the posterior putamina bilaterally and relatively decreased uptake in the right caudate head, compatible with a presynaptic dopaminergic deficit suggesting a parkinsonian syndrome. . 2. Parkinsonian syndromes remain a clinical diagnosis. DaTscan  aids in diagnosis but is not diagnostic of Parkinson's disease.Parkinsonian syndromes remain a clinical diagnosis. DaTscan  aids in diagnosis but is not diagnostic of Parkinson's disease.       HISTORY OF PRESENT ILLNESS:  Aaron Marquez is a 68 y.o. male patient who is here for revisit 08/05/2024 for  the results of his DAT scan. He  is aware that the DAT scan was abnormal. 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 He is not very receptive, and his wife feels he is easily angered.  He has a masked face, mild resting tremor, and walks and sits erect upper body.  Left hand tremor at rest, increasing amplitude with emotions.  His memory tests were much worse in 2024 than in 2025, after he seemed to have recovered from PRES.  It may have been the anti HTN meds that caused him to be fatigued,  he had tried sinemet  tid nd felt this medication left him a ZOMBIE , had a normal EEG,  He is to see neuropsychology in February.  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  I reviewed the reports.     MRI brain WO contrast  03-27-2024 : COMPARISON: CT of the head dated 03/28/2024.   CLINICAL HISTORY: Neuro deficit, acute, stroke suspected.   FINDINGS:   BRAIN AND VENTRICLES: No convincing  evidence of restricted diffusion. No acute infarct. No intracranial hemorrhage. No mass. No midline shift. No hydrocephalus. The sella is unremarkable. Normal flow voids.  No acute abnormality.  There is mild mucosal disease within the floor of the maxillary sinuses. No acute abnormality. Normal marrow signal. No acute soft tissue abnormality.   IMPRESSION: 1. No acute intracranial abnormality. 2. Mild mucosal disease within the floor of the maxillary sinuses.   Electronically signed by: Evalene Coho MD 03/27/2024          Aaron Marquez is a 68 y.o. male patient who is seen upon referral on 06/21/2023 from [facility] Primary care provider : for unspecified  memory concern. There was no previous MMSE documented -it is unclear what cognitive areas/  functions are impaired and how independent this patient is in daily life.   This patient has an extensive medical record that I reviewed over the last 2 days.  He is referred for memory changes but there has been an acute change overall in his cognitive function his ability to retain information since February of this year.  His wife ,Gertie , accompanied the patient today to this visit.   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  She reports that he shuffles ongoing for  years - due to long standing history of  back pain and back surgeries that have not helped. Chronic pain.     Theyre have been short-term memory struggles ongoing:  forgetting where certain objects have been placed, sometimes repeating questions over and over, and she is also reporting that he still has some pressure headaches on the top of his head.  Sometimes she states that his hands shake at rest- left more than right .   All these  symptoms were supposingly not present prior to the fall. Several months ago, he underwent cataract surgery and  hasn't been seeing well since . He has disposed of his glasses and now is unable to read !       Chief concern according to patient :   I am not right since I came back from the hospital    I have the pleasure of seeing Aaron Marquez [birth year] a right-handed male with a possible sleep disorder.     Family medical  history:  no family history of dementia  - verified by 2 older sisters, who are fit.  Social history:  Patient is married,  retired from [employer] in [place], and lives in a household with spouse. Adult children, stepchildren , grandchildren.    Tobacco use: none .  ETOH use : none,  Caffeine intake in form of Coffee( /) Soda( 2 a day) Tea ( arnold palmer 2 a day) or energy drinks. No exercise .     Review of Systems: Out of a complete 14 system review, the patient complains of only the following symptoms, and all other reviewed systems are negative.:   SLEEPINESS ?  How likely are you to doze in the following situations: 0 = not likely, 1 = slight chance, 2 = moderate chance, 3 = high chance  Sitting and Reading? Watching Television? Sitting inactive in a public place (theater or meeting)? Lying down in the afternoon when circumstances permit? Sitting and talking to someone? Sitting quietly after lunch without alcohol? In a car, while stopped for a few minutes in traffic? As a passenger in a car for an hour without a break?  Total =        Social History   Socioeconomic History   Marital status: Married    Spouse name: Not on file   Number of children: Not on file   Years of education: Not on file   Highest education level: Not on file  Occupational History   Not on file  Tobacco Use   Smoking status: Never   Smokeless tobacco: Never  Vaping Use   Vaping status: Never Used  Substance and Sexual Activity   Alcohol use: No   Drug use: No    Sexual activity: Not on file  Other Topics Concern   Not on file  Social History Narrative   Pt lives with family    Retired    Social Drivers of Health   Tobacco Use: Low Risk (08/05/2024)   Patient History    Smoking Tobacco Use: Never    Smokeless Tobacco Use: Never    Passive Exposure: Not on file  Financial Resource Strain: Low Risk (12/26/2023)   Received from [facility]   Overall Financial Resource Strain (CARDIA)    Difficulty of Paying Living Expenses: Not very hard  Food Insecurity: No Food Insecurity (03/27/2024)   Epic    Worried About Running  Out of Food in the Last Year: Never true    Ran Out of Food in the Last Year: Never true  Transportation Needs: No Transportation Needs (03/27/2024)   Epic    Lack of Transportation (Medical): No    Lack of Transportation (Non-Medical): No  Physical Activity: Unknown (12/26/2023)   Received from [facility]   Exercise Vital Sign    On average, how many days per week do you engage in moderate to strenuous exercise (like a brisk walk)?: 0 days    Minutes of Exercise per Session: Not on file  Stress: No Stress Concern Present (12/26/2023)   Received from [facility] of Occup[facility] - Occupational Stress Questionnaire    Feeling of Stress : Only a little  Social Connections: Patient Declined (03/27/2024)   Social Connection and Isolation Panel    Frequency of Communication with Friends and Family: Patient declined    Frequency of Social Gatherings with Friends and Family: Patient declined    Attends Religious Services: Patient declined    Active Member of Clubs or Organizations: Patient declined    Attends Banker Meetings: Patient declined    Marital Status: Patient declined  Depression (PHQ2-9): Not on file  Alcohol Screen: Not on file  Housing: Low Risk (03/27/2024)   Epic    Unable to Pay for Housing in the Last Year: No    Number of Times Moved in the Last Year: 0     Homeless in the Last Year: No  Utilities: Not At Risk (03/27/2024)   Epic    Threatened with loss of utilities: No  Health Literacy: Not on file    Family History  Problem Relation Age of Onset   Parkinson's disease Neg Hx     Past Medical History:  Diagnosis Date   Back pain    GERD (gastroesophageal reflux disease)    Gout    Hypertension    Lumbar hernia    Pneumonia 07/31/12    Past Surgical History:  Procedure Laterality Date   BACK SURGERY     DECOMPRESSIVE LUMBAR LAMINECTOMY LEVEL 1 N/A 03/31/2024   Procedure: DECOMPRESSIVE THORACIC LAMINECTOMY THORACIC ELEVEN-TWELVE;  Surgeon: Onetha Kuba, MD;  Location: [facility] OR;  Service: Neurosurgery;  Laterality: N/A;   FERTILITY SURGERY     stent   LUMBAR LAMINECTOMY/DECOMPRESSION MICRODISCECTOMY Right 01/01/2013   Procedure: LUMBAR LAMINECTOMY/DECOMPRESSION MICRODISCECTOMY 1 LEVEL;  Surgeon: Kuba SHAUNNA Onetha, MD;  Location: [facility] NEURO ORS;  Service: Neurosurgery;  Laterality: Right;  Lumbar Laminectomies Lumbar Four-Five, Decompression, Discectomy      Medications Ordered Prior to Encounter[1]  Allergies[2]   DIAGNOSTIC DATA (LABS, IMAGING, TESTING) - I reviewed patient records, labs, notes, testing and imaging myself where available.  Lab Results  Component Value Date   WBC 12.9 (H) 04/01/2024   HGB 13.7 04/01/2024   HCT 41.4 04/01/2024   MCV 79.3 (L) 04/01/2024   PLT 165 04/01/2024      Component Value Date/Time   NA 132 (L) 04/01/2024 1458   NA 144 06/21/2023 1524   K 4.1 04/01/2024 1458   CL 101 04/01/2024 1458   CO2 22 04/01/2024 1458   GLUCOSE 123 (H) 04/01/2024 1458   BUN 40 (H) 04/01/2024 1458   BUN 19 06/21/2023 1524   CREATININE 1.83 (H) 04/01/2024 1458   CALCIUM  7.8 (L) 04/01/2024 1458   PROT 6.0 (L) 03/27/2024 0726   PROT 6.6 06/21/2023 1524   ALBUMIN  3.2 (L) 03/27/2024 0726   ALBUMIN   4.3 06/21/2023 1524   AST 15 03/27/2024 0726   ALT 7 03/27/2024 0726   ALKPHOS 60 03/27/2024 0726   BILITOT 1.1 03/27/2024  0726   BILITOT 0.8 06/21/2023 1524   GFRNONAA 40 (L) 04/01/2024 1458   GFRAA 45 (L) 06/01/2018 0036   No results found for: CHOL, HDL, LDLCALC, LDLDIRECT, TRIG, CHOLHDL Lab Results  Component Value Date   HGBA1C 5.8 (H) 06/21/2023   Lab Results  Component Value Date   VITAMINB12 296 06/21/2023   Lab Results  Component Value Date   TSH 2.520 03/26/2024    Abnormal DATSCAN  demonstrating decreased radiotracer uptake in the posterior putamina bilaterally and relatively decreased uptake in the right caudate head, compatible with a presynaptic dopaminergic deficit suggesting a parkinsonian syndrome. . 2. Parkinsonian syndromes remain a clinical diagnosis. DaTscan  aids in diagnosis but is not diagnostic of Parkinson's disease.Parkinsonian syndromes remain a clinical diagnosis. DaTscan  aids in diagnosis but is not diagnostic of Parkinson's disease.  PHYSICAL EXAM:  Vitals:   08/05/24 1123  BP: 132/78  Pulse: 89  SpO2: 98%   No data found. Body mass index is 35.8 kg/m.   Wt Readings from Last 3 Encounters:  08/05/24 264 lb (119.7 kg)  06/19/24 262 lb (118.8 kg)  03/31/24 264 lb 15.9 oz (120.2 kg)     Ht Readings from Last 3 Encounters:  08/05/24 6' (1.829 m)  06/19/24 6' 1 (1.854 m)  03/31/24 6' 1 (1.854 m)      General: The patient is awake, alert and appears not in acute distress and groomed. Head: Normocephalic, atraumatic.  Neck is supple. Trunk: BThe patient is awake, alert and appears not in acute distress. The patient is well groomed. He has a masked face, a left hand resting tremor.    Head: Skin:  Without evidence of ankle edema, or rash. Trunk: The patient's posture is slightly stooped.    NEUROLOGIC EXAM: The patient is awake and alert, oriented to place and time.   Memory subjective described as intact. Wide is concerned about STM.       06/21/2023    3:18 PM  MMSE - Mini Mental State Exam  Orientation to time 4  Orientation to  Place 5  Registration 3  Attention/ Calculation 3  Recall 1  Language- name 2 objects 2  Language- repeat 1  Language- follow 3 step command 3  Language- read & follow direction 1  Write a sentence 1  Copy design 1  Total score 25    Attention span & concentration ability appears limited.  Speech is slow-fluent, without cadence, monotonous, un modulated  dysphonia  . He speaks very, very slowed, with good volume.   Mood and affect are aloof.    Cranial nerves: Pupils are equal post cataract un- reactive to light.   Lazy eye on the left Extraocular movements in vertical and horizontal planes were intact and without nystagmus. No Diplopia. Visual fields by finger perimetry are intact. Hearing was intact to soft voice and finger rubbing.    Facial sensation intact to fine touch.  Facial motor strength is symmetric and tongue and uvula move midline.  Again, masked face  Neck ROM : rotation, tilt and flexion extension were normal for age and shoulder shrug was symmetrical.    Motor exam:  Symmetric bulk Elevated  waxy tone without cog wheeling, symmetric loss of grip strength .   Sensory:  Fine touch,and vibration were reduced at the ankles, there was loss of propio-ception,  pronator  drift.    Coordination: Alternating movements in the fingers/hands were of severely slowed -speed.    The Finger-to-nose maneuver was so slow (!) but  without evidence of ataxia, dysmetria - slowed, slow.   Resting tremor on the left hand.    Gait and station:  deferred.   Deep tendon reflexes: in the  upper and lower extremities are symmetric and intact.  Babinski response was deferred.    ASSESSMENT AND PLAN :   68 y.o. year old male  here with:    1) abnormal DAT scan and parkinsonian appearance, stooped now, shuffling ,turning with 5 steps and being slow all over.  This may be affected bu his back problems , too.   2) we did not repeat a MOCA test.   3) He reports history of acting out  dreams, to a point where his wife did not want to share the bed any longer.  REM BD ?  Needs an attended sleep study  with his primary sleep- neurologist at [facility].   4) I added a MMSE test : visio spatial challenges, but beautiful handwriting.SABRA   PLAN :   Parkinsonian appearance, left tremor, abnrormal DAT - / PD reportedly started after PRES - can be vascular .   Neuropsychology testing arranged, not yet done.  Will be seen in Feb 2026 . MMSE was today: 25/ 30. He could not finish the clock.    Plan : PT, please  go to the gym with personal trainer to follow up after PT,  I reccommended [facility]  in [place] as well.   Restart sinemet  and  take medication three times a day, 30 minutes before meals.  25/ 100 mg tid.   RV in 6 months.   Needs aflac papers signed and FMLA for wife.     I would like to thank Naomi Hitch, MD  for allowing me to meet with this pleasant patient.     The patient will be seen in follow-up in the sleep clinic at [facility] for discussion of test results, sleep related symptoms and treatment compliance review, further management strategies, etc.   The referring provider will be notified of the test results.   The patient's condition requires frequent monitoring and adjustments in the treatment plan, reflecting the ongoing complexity of care.  This provider is the continuing focal point for all needed services for this condition.  After spending a total time of  35  minutes face to face and time for  history taking, physical and neurologic examination, review of laboratory studies,  personal review of imaging studies, reports and results of other testing and review of referral information / records as far as provided in visit,   Electronically signed by: Dedra Gores, MD 08/05/2024 11:35 AM  [facility] and [facility] Sleep Board certified by The [employer] of Sleep Medicine and Diplomate of the [employer] of Sleep  Medicine. Board certified In Neurology through the ABPN, Fellow of the [employer] of Neurology.      [1]  Current Outpatient Medications on File Prior to Visit  Medication Sig Dispense Refill   acetaminophen  (TYLENOL ) 325 MG tablet Take 2 tablets (650 mg total) by mouth every 6 (six) hours as needed for mild pain (pain score 1-3) (or Fever >/= 101). 20 tablet 0   allopurinol  (ZYLOPRIM ) 100 MG tablet Take 100 mg by mouth daily. (Patient taking differently: Take 100 mg by mouth as needed.)     amLODipine  (NORVASC ) 10 MG  tablet Take 10 mg by mouth daily.     aspirin  81 MG tablet Take 81 mg by mouth daily.     atorvastatin  (LIPITOR) 40 MG tablet Take 40 mg by mouth daily.     gabapentin (NEURONTIN) 100 MG capsule      hydrALAZINE  (APRESOLINE ) 50 MG tablet Take 50 mg by mouth 2 (two) times daily.     lidocaine  (LIDODERM ) 5 % Place 1 patch onto the skin daily. Remove & Discard patch within 12 hours or as directed by MD     methocarbamol  (ROBAXIN ) 500 MG tablet Take 1 tablet (500 mg total) by mouth every 8 (eight) hours as needed. 20 tablet 0   metoprolol  succinate (TOPROL -XL) 50 MG 24 hr tablet Take 50 mg by mouth daily. Take with or immediately following a meal.     pantoprazole  (PROTONIX ) 40 MG tablet Take 40 mg by mouth daily.     telmisartan (MICARDIS) 20 MG tablet Take 20 mg by mouth daily.     telmisartan (MICARDIS) 40 MG tablet      traMADol  (ULTRAM ) 50 MG tablet Take 50 mg by mouth every 6 (six) hours as needed.     No current facility-administered medications on file prior to visit.  [2]  Allergies Allergen Reactions   Nsaids Other (See Comments)    History of CKD limits potential use   Zestril  [Lisinopril ] Nausea Only and Other (See Comments)    Gagging secondary to tingling in throat   "

## 2024-08-05 NOTE — Patient Instructions (Addendum)
 ASSESSMENT AND PLAN :    68 y.o. year old male  here with:     1) abnormal DAT scan and parkinsonian appearance, stooped now, shuffling ,turning with 5 steps and being slow all over.  This may be affected bu his back problems , too.    2) we did not repeat a MOCA test.    3) He reports history of acting out dreams, to a point where his wife did not want to share the bed any longer.  REM BD ?  Needs an attended sleep study  with his primary sleep neurologist at [facility].      PLAN :    Neuropsychology    Had limb twitching and pain in the past, but there is too much back pain and surgical overlay to neuropathic problems to call it RLS.   Plan : PT, please  go to the gym with personal trainer to follow up after PT,  I reccommended [facility]  in [place] as well.    Restart sinemet  and  take medication three times a day, 30 minutes before meals.  25/ 100 mg tid.    RV in 6 months.    Needs aflac papers signed and FMLA for wife.        I would like to thank Naomi Hitch, MD  for allowing me to meet with this pleasant patient.   Parkinson's Disease Parkinson's disease causes problems with movement. It makes it harder for you to walk or control your body. Each person with the disease is affected differently. Treatments can help you manage your symptoms. Parkinson's disease can range from mild to very bad, but it gets worse over time. This often happens slowly over many years. What are the causes? Parkinson's disease is caused by a loss of brain cells called neurons. These brain cells make a substance called dopamine, which is needed to control body movement. It's not known what causes the brain cells to die. What increases the risk? Being male. Being 22 years of age or older. Having a family history of Parkinson's disease. Having an injury to the brain in the past. Being around things that are harmful or poisonous, such as pesticides. Having depression. This is when you feel sad  or hopeless. What are the signs or symptoms? Symptoms can vary and get worse over time. The main symptoms can be seen in your movement. These include: Shaking or tremors that you can't control. This happens while you're resting. Stiffness in your arms and legs. Losing facial expressions. Walking in a way that isn't normal. You may walk with short, shuffling steps. Loss of balance when standing. You may sway, fall backward, or have trouble making turns. Other symptoms include: Being very sad, worried, or nervous. Having delusions. These are strong beliefs that aren't true. Having hallucinations. This is when you see, hear, taste, smell, or feel things that aren't real. Trouble speaking or swallowing. Trouble pooping (constipation). Needing to pee (urinate) right away, peeing often, or losing control of when you pee or poop. Sleep problems. How is this diagnosed? A diagnosis may be made based on symptoms, your medical history, and a physical exam. You may also have imaging tests that make pictures of your brain. How is this treated? There is no cure for Parkinson's disease. The goal of treatment is to manage your symptoms. It may include: Medicines. Therapy to help with talking or movement. Surgery to reduce shaking and other movements that you can't control. Follow these instructions at  home: Medicines Take your medicines only as told by your health care provider. Avoid taking pain or sleeping medicines. These can affect your thinking. Activity Ask your provider if it's safe for you to drive. Exercise as told by your provider or physical therapist. Lifestyle  Put grab bars and railings in your home, especially near the toilet and in the shower. These help prevent falls. Do not smoke, vape, or use products with nicotine or tobacco in them. If you need help quitting, talk with your provider. Do not drink alcohol. General instructions Talk with your provider to find out what kind of  help you need at home. Ask about ways to stay safe. Join a support group for people with Parkinson's disease. Where to find more information [employer] of Neurological Disorders and Stroke: basicfm.no Parkinson's Foundation: parkinson.org Contact a health care provider if: Medicines don't help your symptoms. You have a lot of side effects from your medicines. You keep losing your balance or you fall. You need more help at home. You have trouble swallowing. You have a very hard time pooping. You feel very sad, worried, or confused. You see, hear, taste, smell, or feel things that aren't real. Get help right away if: You were hurt in a fall. You can't swallow without choking. You have chest pain or trouble breathing. You don't feel safe at home. These symptoms may be an emergency. Call 911 right away. Do not wait to see if the symptoms will go away. Do not drive yourself to the hospital. Also, get help right away if: You feel like you may hurt yourself or others. You have thoughts about taking your own life. Take one of these steps: Go to your nearest emergency room. Call 911. Call the National Suicide Prevention Lifeline at 787-388-9446 or 988. Text the Crisis Text Line at 859 511 5221. This information is not intended to replace advice given to you by your health care provider. Make sure you discuss any questions you have with your health care provider. Document Revised: 04/26/2023 Document Reviewed: 10/09/2022 Elsevier Patient Education  2024 [employer].

## 2024-08-13 DIAGNOSIS — Z0289 Encounter for other administrative examinations: Secondary | ICD-10-CM

## 2024-08-13 NOTE — Therapy (Signed)
 " OUTPATIENT PHYSICAL THERAPY PARKINSON'S EVALUATION   Patient Name: Aaron Marquez MRN: [id number] DOB:[birth year], 69 y.o., male Today's Date: 08/14/2024   END OF SESSION:  PT End of Session - 08/14/24 1532     Visit Number 1    Date for Recertification  11/06/24    Authorization Type [employer]    Authorization Time Period *Auth pending    Progress Note Due on Visit 10    PT Start Time 1532    PT Stop Time 1626    PT Time Calculation (min) 54 min    Activity Tolerance Patient tolerated treatment well    Behavior During Therapy Flat affect          Past Medical History:  Diagnosis Date   Back pain    GERD (gastroesophageal reflux disease)    Gout    Hypertension    Lumbar hernia    Pneumonia 07/31/12   Past Surgical History:  Procedure Laterality Date   BACK SURGERY     DECOMPRESSIVE LUMBAR LAMINECTOMY LEVEL 1 N/A 03/31/2024   Procedure: DECOMPRESSIVE THORACIC LAMINECTOMY THORACIC ELEVEN-TWELVE;  Surgeon: Onetha Kuba, MD;  Location: [facility] OR;  Service: Neurosurgery;  Laterality: N/A;   FERTILITY SURGERY     stent   LUMBAR LAMINECTOMY/DECOMPRESSION MICRODISCECTOMY Right 01/01/2013   Procedure: LUMBAR LAMINECTOMY/DECOMPRESSION MICRODISCECTOMY 1 LEVEL;  Surgeon: Kuba SHAUNNA Onetha, MD;  Location: [facility];  Service: Neurosurgery;  Laterality: Right;  Lumbar Laminectomies Lumbar Four-Five, Decompression, Discectomy    Patient Active Problem List   Diagnosis Date Noted   Parkinsonism (HCC) 06/19/2024   HNP (herniated nucleus pulposus with myelopathy), thoracic 03/31/2024   Back pain 03/27/2024   Upper back pain 03/27/2024   History of gout 03/27/2024   Essential hypertension 03/27/2024   Altered mental status 03/26/2024   Atypical chest pain 01/18/2024   History of essential hypertension 01/18/2024   HLD (hyperlipidemia) 01/18/2024   GERD (gastroesophageal reflux disease) 01/18/2024   CKD stage 3b, GFR 30-44 ml/min (HCC) 01/18/2024   Parkinson's disease (HCC) 01/18/2024    Hypertensive crisis, unspecified 06/21/2023   Neurodegenerative cognitive impairment 06/21/2023   PRES (posterior reversible encephalopathy syndrome) 06/21/2023   Resting tremor 06/21/2023    PCP: Naomi Hitch, MD   REFERRING PROVIDER: Dohmeier, Dedra, MD   REFERRING DIAG:  G25.2 (ICD-10-CM) - Resting tremor  G31.9 (ICD-10-CM) - Neurodegenerative cognitive impairment  H53.9 (ICD-10-CM) - Vision changes   Parkinsonian patient, with recent back surgery. Needs gait and balance training.   THERAPY DIAG:  Parkinsonism, unspecified Parkinsonism type (HCC)  Other low back pain  Pain in thoracic spine  Radiculopathy, thoracolumbar region  Muscle weakness (generalized)  Difficulty in walking, not elsewhere classified  RATIONALE FOR EVALUATION AND TREATMENT: Rehabilitation  ONSET DATE: 03/31/2024 - Decompressive thoracic laminectomy T11-12  Parkinson's disease diagnosed within the past year  NEXT MD VISIT: 02/03/2025; 08/21/24 with neurosurgeon - Kuba Onetha, MD   SUBJECTIVE:  SUBJECTIVE STATEMENT: Patient reports back pain has been constant since his surgery in 03/2024 - no better but no worse. He is to f/u with Dr. Onetha (neurosurgeon) on 08/21/24 for ongoing pain issues and to go over 07/10/24 MRIs.  Newly diagnosed with PD.  Notes tremors in L hand.  Doesn't walk as fast as he used to.   Pt accompanied by: self  PAIN: Are you having pain? Yes: NPRS scale: 9/10  Pain location: B mid/lower back into R hip/buttock  Pain description: sharp  Aggravating factors: prolonged lying, sitting, or standing in one position, driving long distances  Relieving factors: Tylenol , prescription pain meds and muscle relaxants (but muscle relaxant make him sleepy or feel like a zombie)    PERTINENT HISTORY:  Parkinsonism/Parkinson's disease, PRES 06/2023, resting tremor - L hand (non-dominant side), HTN, gout, lumbar hernia, GERD, back pain, CKD-IIIb Per neuro: severely neuro-cognitively impaired, slowed, masked face, resting tremor in the left hand, the non-dominant site    PRECAUTIONS: None  RED FLAGS: None  WEIGHT BEARING RESTRICTIONS: No  FALLS:  Has patient fallen in last 6 months? No  LIVING ENVIRONMENT: Lives with: lives with their spouse Lives in: House Stairs: Yes: External: 2 steps; on right going up, on left going up, and can reach both Has following equipment at home: Single point cane  OCCUPATION: Retired   PLOF: Independent with household mobility without device, Independent with community mobility with device, Needs assistance with ADLs, and Leisure: programmer, multimedia, sing gospel  PATIENT GOALS: I hope that I can get stronger than what I am.   OBJECTIVE: (objective measures completed at initial evaluation unless otherwise dated)  DIAGNOSTIC FINDINGS:  07/18/24 - EXAM: DATSCAN  CLINICAL HISTORY: PRES (posterior reversible encephalopathy syndrome), Neurodegenerative cognitive impairment, Resting tremor, Parkinsonism, unspecified Parkinsonism type (HCC), Tremor of left hand.   FINDINGS:   Good target to background ratio. There is significant decreased radiotracer activity within the posterior left and right putamen. There is decreased relative radiotracer activity in the head of the right caudate nucleus compared to the left.   IMPRESSION: 1. Abnormal DATSCAN  demonstrating decreased radiotracer uptake in the posterior putamina bilaterally and relatively decreased uptake in the right caudate head, compatible with a presynaptic dopaminergic deficit suggesting a parkinsonian syndrome.  2. Parkinsonian syndromes remain a clinical diagnosis. DaTscan  aids in diagnosis but is not diagnostic of Parkinson's disease.Parkinsonian syndromes remain a  clinical diagnosis. DaTscan  aids in diagnosis but is not diagnostic of Parkinson's disease.  07/10/2024 - MRI THORACIC SPINE WITH AND WITHOUT CONTRAST  INDICATION: Other spondylosis with myelopathy, thoracic region \ M47.14 Other spondylosis with myelopathy, thoracic region   LEVELS IMAGED: Lower cervical region to upper lumbar region.   FINDINGS:  Thoracic spinal cord: Normal signal and contour. Conus medullaris terminates in normal position.   Vertebrae: Mild height loss along the central aspect of T1 and T2. No edema. No marrow signal abnormality to suggest neoplasm.   Degenerative changes: Diffuse degenerative disc disease with disc herniations throughout the thoracic spine without advanced canal stenosis. There is been interval development of height loss along the posterior aspect of the T11-T12 disc space with associated edema and extruded T2 hyperintense material into the ventral epidural space, eccentric to the left and extending behind the T12 vertebral body. There is associated avid but heterogeneous enhancement of the extruded disc material and of the adjacent endplates. STIR signal within the disc space and adjacent endplates but with preservation of the T1 hypointense cortical margins. No significant enhancement within the adjacent surrounding  epidural space.   Alignment: No substantial subluxation.   Paraspinal soft tissues: Postoperative changes at T11-T12 within the paraspinal soft tissues where there is edema and enhancement but not extending deep to the supraspinous ligament.   IMPRESSION:  1. Findings most compatible with a large disc extrusion at T11-T12 with associated inflammatory changes resulting in mild canal stenosis. Infection is considered less likely given the lack of surrounding soft tissue changes and preserved hypointense cortical endplate signal. Suggest correlation with clinical signs and symptoms.  2.  No high-grade canal or foraminal stenosis within the  thoracic spine.  3.  No thoracic spinal cord signal abnormality.   07/10/2024 - MRI LUMBAR SPINE WITHOUT CONTRAST  INDICATION: Radiculopathy, lumbar region \ M54.16 Radiculopathy, lumbar region   LEVELS IMAGED: Lower thoracic region to upper sacrum.   FINDINGS:   Alignment: Mild retrolisthesis at L5-S1.   Vertebrae: Vertebral body heights are maintained. Heterogeneous bone marrow signal. Modic type II changes at L4-L5 and L5-S1.   Conus medullaris: In normal position. Fatty filum terminale.   Degenerative changes:  T12-L1: Posterior disc bulge with a small inferiorly dissecting disc herniation, but no substantial canal or foraminal stenosis.  L1-L2: Disc bulge with a left paracentral disc herniation results in mild left lateral recess stenosis. No significant foraminal stenosis.  L2-L3: Disc bulge, ligamentum flavum thickening, and facet hypertrophy results in mild bilateral lateral recess stenosis. No significant foraminal stenosis.  L3-L4: Disc bulge, ligamentum flavum thickening, and facet hypertrophy results in moderate bilateral lateral recess stenosis. Mild bilateral foraminal stenosis.  L4-L5: Disc bulge, ligamentum flavum thickening, and facet hypertrophy results in severe left and moderate right-sided lateral recess stenosis and moderate bilateral foraminal stenosis.  L5-S1: Disc bulge, ligamentum flavum thickening, and facet hypertrophy results in moderate bilateral lateral recess stenosis and severe left and moderate right-sided foraminal stenosis.   Upper sacrum: No focal lesion identified.   Additional findings: Left-sided renal cyst.   IMPRESSION:  1. Multilevel degenerative changes within the lumbar spine with severe left-sided foraminal stenosis at L5-S1.  2.  Multilevel lateral recess stenosis worst at L4-L5.  3.  Overall, findings are similar to prior.  4.  Diffuse heterogeneous marrow signal is nonspecific, but can be seen in individuals with chronic anemia, chronic  hypoxemia (such as smoking) versus other marrow infiltrative/myeloproliferative disorders.   03/27/24 - MRI THORACIC SPINE WITHOUT INTRAVENOUS CONTRAST CLINICAL HISTORY: Mid-back pain, infection suspected, positive xray/CT.   FINDINGS:   BONES AND ALIGNMENT: Normal alignment. Normal vertebral body heights. Bone marrow signal is unremarkable. No abnormal enhancement.   SPINAL CORD: Normal spinal cord volume. Normal spinal cord signal.   SOFT TISSUES: Unremarkable.   DEGENERATIVE CHANGES: There is a left posterolateral disc protrusion at T11-12, which is impressing upon the ventral surface of the spinal cord and causing moderate left-sided spinal canal stenosis. The protruded disc measures approximately 6 mm in AP dimension and 13 mm in transverse dimension and 19 mm in Craniocaudad length.   There is minimal posterior disc bulging also present at T3-4, T5-6, T6-7, T7-8, T8-9, T9-10 and T10-11, with very mild central spinal canal stenosis at each level, but no impingement upon the spinal cord or nerve roots.   IMPRESSION: 1. Left posterolateral disc protrusion at T11-12 impressing upon the ventral surface of the spinal cord and causing moderate left-sided spinal canal stenosis. 2. Minimal posterior disc bulging at T3-4, T5-6, T6-7, T7-8, T8-9, T9-10, and T10-11 with very mild central spinal canal stenosis at each level, without impingement upon the spinal  cord or nerve roots.  03/27/24 - MRI brain WO contrast    CLINICAL HISTORY: Neuro deficit, acute, stroke suspected.   FINDINGS:   BRAIN AND VENTRICLES: No convincing evidence of restricted diffusion. No acute infarct. No intracranial hemorrhage. No mass. No midline shift. No hydrocephalus. The sella is unremarkable. Normal flow voids.  No acute abnormality.  There is mild mucosal disease within the floor of the maxillary sinuses. No acute abnormality. Normal marrow signal. No acute soft tissue abnormality.    IMPRESSION: 1. No acute intracranial abnormality. 2. Mild mucosal disease within the floor of the maxillary sinuses.  COGNITION: Overall cognitive status: Difficulty to assess due to: no family present and No family/caregiver present to determine baseline cognitive functioning   SENSATION:    COORDINATION:   POSTURE:  rounded shoulders, forward head, and flexed trunk   LUMBAR ROM:   Active  A/PROM  eval  Flexion Hands to distal thighs  Extension 80% limited - p!  Right lateral flexion 75% limited   Left lateral flexion 75% limited - p! on R  Right rotation 30% limited - p! on R  Left rotation 50% limited - p! on R   (Blank rows = not tested)  MUSCLE LENGTH: TBA next visit Hamstrings:  ITB:  Piriformis:  Hip flexors:  Quads:  Heelcord:   LOWER EXTREMITY ROM:     Active  Right eval Left eval  Hip flexion    Hip extension    Hip abduction    Hip adduction    Hip internal rotation    Hip external rotation    Knee flexion    Knee extension    Ankle dorsiflexion    Ankle plantarflexion    Ankle inversion    Ankle eversion     (Blank rows = not tested)  LOWER EXTREMITY MMT:    MMT Right eval Left eval  Hip flexion 4 4+  Hip extension 4- 4  Hip abduction 4 4+  Hip adduction 4+ 4+  Hip internal rotation 4 4+  Hip external rotation 4+ 5  Knee flexion 5 5  Knee extension 5 5  Ankle dorsiflexion 4 4+  Ankle plantarflexion    Ankle inversion    Ankle eversion    (Blank rows = not tested)  BED MOBILITY:     TRANSFERS: Assistive device utilized:    Sit to stand: Modified independence Stand to sit: Modified independence Chair to chair:   Floor:    GAIT: Distance walked: clinic distances Assistive device utilized: None Level of assistance: Complete Independence Gait pattern: decreased stride length and trunk flexed Comments: exceedingly slow pace  STAIRS:  Level of Assistance:    Psychologist, Counselling Technique:   Number of Stairs:    Height of  Stairs:   Comments:   FUNCTIONAL TESTS:  5 times sit to stand:   Timed up and go (TUG):   10 meter walk test:    PATIENT SURVEYS:  ABC scale: The Activities-Specific Balance Confidence (ABC) Scale 0% 10 20 30  40 50 60 70 80 90 100% No confidence<->completely confident  How confident are you that you will not lose your balance or become unsteady when you . . .  Date tested 08/14/2024   Walk around the house 100%  2. Walk up or down stairs 60%  3. Bend over and pick up a slipper from in front of a closet floor 80%  4. Reach for a small can off a shelf at eye level 100%  5. Stand  on tip toes and reach for something above your head 90%  6. Stand on a chair and reach for something 50%  7. Sweep the floor 100%  8. Walk outside the house to a car parked in the driveway 100%  9. Get into or out of a car 100%  10. Walk across a parking lot to the mall 100%  11. Walk up or down a ramp 60%  12. Walk in a crowded mall where people rapidly walk past you 100%  13. Are bumped into by people as you walk through the mall 70%  14. Step onto or off of an escalator while you are holding onto the railing 80%  15. Step onto or off an escalator while holding onto parcels such that you cannot hold onto the railing 50%  16. Walk outside on icy sidewalks 70%  Total: #/16 1310/1600 = 81.9 %  Level of physical functioning: High    Modified Oswestry:  MODIFIED OSWESTRY DISABILITY SCALE  Date:  Score - 08/14/2024   Pain intensity 4 =  Pain medication provides me with little relief from pain.  2. Personal care (washing, dressing, etc.) 2 =  It is painful to take care of myself, and I am slow and careful.  3. Lifting 3 = Pain prevents me from lifting heavy weights, but I can manage light to medium weights if they are conveniently positioned  4. Walking 3 =  Pain prevents me from walking more than  mile.  5. Sitting 3 =  Pain prevents me from sitting more than  hour.  6. Standing 4 =  Pain prevents me from  standing more than 10 minutes.  7. Sleeping 4 =  Even when I take pain medication, I sleep less than 2 hour  8. Social Life 3 =  Pain prevents me from going out very often.  9. Traveling 2 =  My pain restricts my travel over 2 hours.  10. Employment/ Homemaking 3 = Pain prevents me from doing anything but light duties.  Total 31/50  % Disability 62.0 % - Crippling   Interpretation of scores: Score Category Description  0-20% Minimal Disability The patient can cope with most living activities. Usually no treatment is indicated apart from advice on lifting, sitting and exercise  21-40% Moderate Disability The patient experiences more pain and difficulty with sitting, lifting and standing. Travel and social life are more difficult and they may be disabled from work. Personal care, sexual activity and sleeping are not grossly affected, and the patient can usually be managed by conservative means  41-60% Severe Disability Pain remains the main problem in this group, but activities of daily living are affected. These patients require a detailed investigation  61-80% Crippled Back pain impinges on all aspects of the patients life. Positive intervention is required  81-100% Bed-bound These patients are either bed-bound or exaggerating their symptoms  Bluford FORBES Zoe DELENA Karon DELENA, et al. Surgery versus conservative management of stable thoracolumbar fracture: the PRESTO feasibility RCT. [place] ([place]): [employer]; 2021 Nov. [facility] Technology Assessment, No. 25.62.) Appendix 3, Oswestry Disability Index category descriptors. Available from: Findjewelers.cz  Minimally Clinically Important Difference (MCID) = 12.8%   TODAY'S TREATMENT:    08/14/2024 - Eval SELF CARE:  Reviewed eval findings and role of PT in addressing identified deficits as well as need for further assessment of deficits related to PD.    PATIENT EDUCATION:  Education details: PT eval  findings, anticipated POC, and need for further  assessment of mobility and standardized balance testing  Person educated: Patient Education method: Explanation Education comprehension: verbalized understanding  HOME EXERCISE PROGRAM: TBD   ASSESSMENT:  CLINICAL IMPRESSION: Aaron Marquez is a 69 y.o. male who was referred to physical therapy for evaluation and treatment for Parkinson's disease/Parkinsonism.  Patient was diagnosed with Parkinson's within the last year.  He also continues to experience chronic back pain following decompressive thoracic laminectomy T11-12 on 03/31/24 for a L posterolateral disc protrusion at T11-12 impressing upon the ventral surface of the spinal cord and causing moderate left-sided spinal canal stenosis.  Pain is worse with prolonged postioning in lying, sitting or standing.  Patient has deficits in thoracolumbar ROM, abnormal posture, and R proximal LE radicular pain affecting ADLs and daily mobility.  On Modified Oswestry patient scored 31/50 demonstrating 62% or crippling disability.  Unable to complete full neuro assessment due to initially focusing on patient's c/o LBP, however observation of and testing so far reveal patient also presents with physical impairments of decreased timing and coordination of gait, impaired ambulation, impaired standing balance, abnormal posture, bradykinesia with transfers, impaired activity tolerance, LE weakness, postural instability and decreased safety awareness impacting safe and independent functional mobility.  Formal assessment of standardized balance testing to be completed next visit. ABC scale score of 81.9% indicates a high level of physical functioning, however patient observation does not fully correlate with reported confidence levels.  Adain will benefit from skilled PT to address above deficits to improve mobility and activity tolerance to help reach the maximal level of functional independence with mobility and gait  with reduced risk for falls.  Patient demonstrates understanding of this POC and is in agreement with this plan.   OBJECTIVE IMPAIRMENTS: Abnormal gait, decreased activity tolerance, decreased balance, decreased endurance, decreased knowledge of condition, decreased knowledge of use of DME, decreased mobility, difficulty walking, decreased ROM, decreased strength, decreased safety awareness, increased fascial restrictions, impaired perceived functional ability, increased muscle spasms, impaired flexibility, improper body mechanics, postural dysfunction, and pain.   ACTIVITY LIMITATIONS: carrying, lifting, bending, sitting, standing, squatting, sleeping, stairs, transfers, bed mobility, bathing, dressing, locomotion level, and caring for others  PARTICIPATION LIMITATIONS: meal prep, cleaning, laundry, driving, shopping, community activity, and church  PERSONAL FACTORS: Age, Behavior pattern, Past/current experiences, Time since onset of injury/illness/exacerbation, and 3+ comorbidities: Parkinsonism/Parkinson's disease, h/o PRES 06/2023, resting tremor - L hand (non-dominant side), HTN, gout, lumbar hernia, GERD, back pain, CKD-IIIb, severely neuro-cognitively impairment are also affecting patient's functional outcome.     REHAB POTENTIAL: Fair - limited self-awareness of deficits  CLINICAL DECISION MAKING: Unstable/unpredictable  EVALUATION COMPLEXITY: High   GOALS: Goals reviewed with patient? Yes  SHORT TERM GOALS: Target date: 09/25/2024  Patient will be independent with initial HEP. Baseline: TBD Goal status: INITIAL  2.  Patient will report 25% reduction in thoracolumbar back pain to allow for improve QOL. Baseline: 9/10 Goal status: INITIAL  3.  Patient will be educated on strategies to decrease risk of falls.  Baseline:  Goal status: INITIAL  4.  Patient will demonstrate decreased fall risk by scoring < 25 sec on TUG. Baseline: TBA Goal status: INITIAL  LONG TERM GOALS:  Target date: 11/06/2024  Patient will be independent with ongoing/advanced HEP for self-management at home incorporating PWR! Moves as indicated .  Baseline:  Goal status: INITIAL  2.  Patient will demonstrate gait speed of >/= 1.8 ft/sec (0.55 m/s) to be a safe limited community ambulator with decreased risk for recurrent falls.  Baseline: TBD Goal  status: INITIAL  3.  Patient will improve 5xSTS time to </= 20 seconds to demonstrate improved functional strength and transfer efficiency. Baseline: TBD Goal status: INITIAL  4.  Patient will demonstrate at least 19/24 on DGI to improve gait stability and reduce risk for falls. Baseline: TBA Goal status: INITIAL  5.  Patient will report </= 50% on Modified Oswestry to demonstrate improving functional activity tolerance. Baseline: 31 / 50 = 62.0 % Goal status: INITIAL  6.  Patient will report at least 50% reduction in thoracolumbar back pain to allow for improve QOL. Baseline: 9/10 Goal status: INITIAL   7.  Patient will verbalize understanding of local Parkinson's disease community resources, including community fitness post d/c. Baseline:  Goal status: INITIAL   PLAN:  PT FREQUENCY: 2x/week  PT DURATION: 12 weeks  PLANNED INTERVENTIONS: 97164- PT Re-evaluation, 97750- Physical Performance Testing, 97110-Therapeutic exercises, 97530- Therapeutic activity, [id number]- Neuromuscular re-education, 97535- Self Care, 02859- Manual therapy, [phone]- Gait training, [phone]- Electrical stimulation (unattended), 97035- Ultrasound, 02966- Ionotophoresis 4mg /ml Dexamethasone , 79439 (1-2 muscles), 20561 (3+ muscles)- Dry Needling, Patient/Family education, Balance training, Stair training, Taping, Joint mobilization, Spinal mobilization, Scar mobilization, DME instructions, Cryotherapy, and Moist heat  PLAN FOR NEXT SESSION: Complete neuro assessment including standardized balance testing - 5xSTS, TUG, and DGI; assess LE ROM/flexibility; create  initial HEP to address core, thoracolumbar and lumbopelvic flexibility and strength; MT and/or modalities for pain management PRN   Elijah CHRISTELLA Hidden, PT 08/14/2024, 7:08 PM  "

## 2024-08-14 ENCOUNTER — Other Ambulatory Visit: Payer: Self-pay

## 2024-08-14 ENCOUNTER — Ambulatory Visit: Attending: Neurology | Admitting: Physical Therapy

## 2024-08-14 DIAGNOSIS — G252 Other specified forms of tremor: Secondary | ICD-10-CM | POA: Insufficient documentation

## 2024-08-14 DIAGNOSIS — R262 Difficulty in walking, not elsewhere classified: Secondary | ICD-10-CM | POA: Insufficient documentation

## 2024-08-14 DIAGNOSIS — G20C Parkinsonism, unspecified: Secondary | ICD-10-CM | POA: Insufficient documentation

## 2024-08-14 DIAGNOSIS — H539 Unspecified visual disturbance: Secondary | ICD-10-CM | POA: Insufficient documentation

## 2024-08-14 DIAGNOSIS — M5459 Other low back pain: Secondary | ICD-10-CM | POA: Insufficient documentation

## 2024-08-14 DIAGNOSIS — G319 Degenerative disease of nervous system, unspecified: Secondary | ICD-10-CM | POA: Insufficient documentation

## 2024-08-14 DIAGNOSIS — M6281 Muscle weakness (generalized): Secondary | ICD-10-CM | POA: Insufficient documentation

## 2024-08-14 DIAGNOSIS — M546 Pain in thoracic spine: Secondary | ICD-10-CM | POA: Insufficient documentation

## 2024-08-14 DIAGNOSIS — M5415 Radiculopathy, thoracolumbar region: Secondary | ICD-10-CM | POA: Insufficient documentation

## 2024-08-19 ENCOUNTER — Encounter: Payer: Self-pay | Admitting: Physical Therapy

## 2024-08-19 ENCOUNTER — Ambulatory Visit: Admitting: Physical Therapy

## 2024-08-19 DIAGNOSIS — R262 Difficulty in walking, not elsewhere classified: Secondary | ICD-10-CM

## 2024-08-19 DIAGNOSIS — M5459 Other low back pain: Secondary | ICD-10-CM

## 2024-08-19 DIAGNOSIS — G20C Parkinsonism, unspecified: Secondary | ICD-10-CM

## 2024-08-19 DIAGNOSIS — M546 Pain in thoracic spine: Secondary | ICD-10-CM

## 2024-08-19 DIAGNOSIS — M6281 Muscle weakness (generalized): Secondary | ICD-10-CM

## 2024-08-19 DIAGNOSIS — M5415 Radiculopathy, thoracolumbar region: Secondary | ICD-10-CM

## 2024-08-19 NOTE — Therapy (Signed)
 " OUTPATIENT PHYSICAL THERAPY PARKINSON'S EVALUATION   Patient Name: Aaron Marquez MRN: [id number] DOB:05-23-1956, 69 y.o., male Today's Date: 08/19/2024   END OF SESSION:  PT End of Session - 08/19/24 1534     Visit Number 2    Date for Recertification  11/06/24    Authorization Type [employer]    Authorization Time Period 08/14/24 - 11/12/24    Authorization - Visit Number 2    Authorization - Number of Visits 10    Progress Note Due on Visit 10    PT Start Time 1534    PT Stop Time 1617    PT Time Calculation (min) 43 min    Activity Tolerance Patient tolerated treatment well;Patient limited by pain    Behavior During Therapy Flat affect           Past Medical History:  Diagnosis Date   Back pain    GERD (gastroesophageal reflux disease)    Gout    Hypertension    Lumbar hernia    Pneumonia 07/31/12   Past Surgical History:  Procedure Laterality Date   BACK SURGERY     DECOMPRESSIVE LUMBAR LAMINECTOMY LEVEL 1 N/A 03/31/2024   Procedure: DECOMPRESSIVE THORACIC LAMINECTOMY THORACIC ELEVEN-TWELVE;  Surgeon: Onetha Kuba, MD;  Location: [facility] OR;  Service: Neurosurgery;  Laterality: N/A;   FERTILITY SURGERY     stent   LUMBAR LAMINECTOMY/DECOMPRESSION MICRODISCECTOMY Right 01/01/2013   Procedure: LUMBAR LAMINECTOMY/DECOMPRESSION MICRODISCECTOMY 1 LEVEL;  Surgeon: Kuba SHAUNNA Onetha, MD;  Location: [facility];  Service: Neurosurgery;  Laterality: Right;  Lumbar Laminectomies Lumbar Four-Five, Decompression, Discectomy    Patient Active Problem List   Diagnosis Date Noted   Parkinsonism (HCC) 06/19/2024   HNP (herniated nucleus pulposus with myelopathy), thoracic 03/31/2024   Back pain 03/27/2024   Upper back pain 03/27/2024   History of gout 03/27/2024   Essential hypertension 03/27/2024   Altered mental status 03/26/2024   Atypical chest pain 01/18/2024   History of essential hypertension 01/18/2024   HLD (hyperlipidemia) 01/18/2024   GERD (gastroesophageal reflux  disease) 01/18/2024   CKD stage 3b, GFR 30-44 ml/min (HCC) 01/18/2024   Parkinson's disease (HCC) 01/18/2024   Hypertensive crisis, unspecified 06/21/2023   Neurodegenerative cognitive impairment 06/21/2023   PRES (posterior reversible encephalopathy syndrome) 06/21/2023   Resting tremor 06/21/2023    PCP: Naomi Hitch, MD   REFERRING PROVIDER: Dohmeier, Dedra, MD   REFERRING DIAG:  G25.2 (ICD-10-CM) - Resting tremor  G31.9 (ICD-10-CM) - Neurodegenerative cognitive impairment  H53.9 (ICD-10-CM) - Vision changes   Parkinsonian patient, with recent back surgery. Needs gait and balance training.   THERAPY DIAG:  Parkinsonism, unspecified Parkinsonism type (HCC)  Other low back pain  Pain in thoracic spine  Radiculopathy, thoracolumbar region  Muscle weakness (generalized)  Difficulty in walking, not elsewhere classified  RATIONALE FOR EVALUATION AND TREATMENT: Rehabilitation  ONSET DATE: 03/31/2024 - Decompressive thoracic laminectomy T11-12  Parkinson's disease diagnosed within the past year  NEXT MD VISIT: 02/03/2025; 08/21/24 with neurosurgeon - Kuba Onetha, MD   SUBJECTIVE:  SUBJECTIVE STATEMENT: Patient missed his appointment earlier this afternoon due to being tied up at MD office.  He reports he will be seeing his doctor for his back on Thursday.  EVAL: Patient reports back pain has been constant since his surgery in 03/2024 - no better but no worse. He is to f/u with Dr. Onetha (neurosurgeon) on 08/21/24 for ongoing pain issues and to go over 07/10/24 MRIs.  Newly diagnosed with PD.  Notes tremors in L hand.  Doesn't walk as fast as he used to.   Pt accompanied by: self  PAIN: Are you having pain? Yes: NPRS scale: 8-8.5/10  Pain location: B mid/lower back into R  hip/buttock  Pain description: sharp  Aggravating factors: prolonged lying, sitting, or standing in one position, driving long distances  Relieving factors: Tylenol , prescription pain meds and muscle relaxants (but muscle relaxant make him sleepy or feel like a zombie)   PERTINENT HISTORY:  Parkinsonism/Parkinson's disease, PRES 06/2023, resting tremor - L hand (non-dominant side), HTN, gout, lumbar hernia, GERD, back pain, CKD-IIIb Per neuro: severely neuro-cognitively impaired, slowed, masked face, resting tremor in the left hand, the non-dominant site    PRECAUTIONS: None  RED FLAGS: None  WEIGHT BEARING RESTRICTIONS: No  FALLS:  Has patient fallen in last 6 months? No  LIVING ENVIRONMENT: Lives with: lives with their spouse Lives in: House Stairs: Yes: External: 2 steps; on right going up, on left going up, and can reach both Has following equipment at home: Single point cane  OCCUPATION: Retired   PLOF: Independent with household mobility without device, Independent with community mobility with device, Needs assistance with ADLs, and Leisure: programmer, multimedia, sing gospel  PATIENT GOALS: I hope that I can get stronger than what I am.   OBJECTIVE: (objective measures completed at initial evaluation unless otherwise dated)  DIAGNOSTIC FINDINGS:  07/18/24 - EXAM: DATSCAN  CLINICAL HISTORY: PRES (posterior reversible encephalopathy syndrome), Neurodegenerative cognitive impairment, Resting tremor, Parkinsonism, unspecified Parkinsonism type (HCC), Tremor of left hand.   FINDINGS:   Good target to background ratio. There is significant decreased radiotracer activity within the posterior left and right putamen. There is decreased relative radiotracer activity in the head of the right caudate nucleus compared to the left.   IMPRESSION: 1. Abnormal DATSCAN  demonstrating decreased radiotracer uptake in the posterior putamina bilaterally and relatively decreased uptake in the  right caudate head, compatible with a presynaptic dopaminergic deficit suggesting a parkinsonian syndrome.  2. Parkinsonian syndromes remain a clinical diagnosis. DaTscan  aids in diagnosis but is not diagnostic of Parkinson's disease.Parkinsonian syndromes remain a clinical diagnosis. DaTscan  aids in diagnosis but is not diagnostic of Parkinson's disease.  07/10/2024 - MRI THORACIC SPINE WITH AND WITHOUT CONTRAST  INDICATION: Other spondylosis with myelopathy, thoracic region \ M47.14 Other spondylosis with myelopathy, thoracic region   LEVELS IMAGED: Lower cervical region to upper lumbar region.   FINDINGS:  Thoracic spinal cord: Normal signal and contour. Conus medullaris terminates in normal position.   Vertebrae: Mild height loss along the central aspect of T1 and T2. No edema. No marrow signal abnormality to suggest neoplasm.   Degenerative changes: Diffuse degenerative disc disease with disc herniations throughout the thoracic spine without advanced canal stenosis. There is been interval development of height loss along the posterior aspect of the T11-T12 disc space with associated edema and extruded T2 hyperintense material into the ventral epidural space, eccentric to the left and extending behind the T12 vertebral body. There is associated avid but heterogeneous enhancement of the extruded disc  material and of the adjacent endplates. STIR signal within the disc space and adjacent endplates but with preservation of the T1 hypointense cortical margins. No significant enhancement within the adjacent surrounding epidural space.   Alignment: No substantial subluxation.   Paraspinal soft tissues: Postoperative changes at T11-T12 within the paraspinal soft tissues where there is edema and enhancement but not extending deep to the supraspinous ligament.   IMPRESSION:  1. Findings most compatible with a large disc extrusion at T11-T12 with associated inflammatory changes resulting in mild  canal stenosis. Infection is considered less likely given the lack of surrounding soft tissue changes and preserved hypointense cortical endplate signal. Suggest correlation with clinical signs and symptoms.  2.  No high-grade canal or foraminal stenosis within the thoracic spine.  3.  No thoracic spinal cord signal abnormality.   07/10/2024 - MRI LUMBAR SPINE WITHOUT CONTRAST  INDICATION: Radiculopathy, lumbar region \ M54.16 Radiculopathy, lumbar region   LEVELS IMAGED: Lower thoracic region to upper sacrum.   FINDINGS:   Alignment: Mild retrolisthesis at L5-S1.   Vertebrae: Vertebral body heights are maintained. Heterogeneous bone marrow signal. Modic type II changes at L4-L5 and L5-S1.   Conus medullaris: In normal position. Fatty filum terminale.   Degenerative changes:  T12-L1: Posterior disc bulge with a small inferiorly dissecting disc herniation, but no substantial canal or foraminal stenosis.  L1-L2: Disc bulge with a left paracentral disc herniation results in mild left lateral recess stenosis. No significant foraminal stenosis.  L2-L3: Disc bulge, ligamentum flavum thickening, and facet hypertrophy results in mild bilateral lateral recess stenosis. No significant foraminal stenosis.  L3-L4: Disc bulge, ligamentum flavum thickening, and facet hypertrophy results in moderate bilateral lateral recess stenosis. Mild bilateral foraminal stenosis.  L4-L5: Disc bulge, ligamentum flavum thickening, and facet hypertrophy results in severe left and moderate right-sided lateral recess stenosis and moderate bilateral foraminal stenosis.  L5-S1: Disc bulge, ligamentum flavum thickening, and facet hypertrophy results in moderate bilateral lateral recess stenosis and severe left and moderate right-sided foraminal stenosis.   Upper sacrum: No focal lesion identified.   Additional findings: Left-sided renal cyst.   IMPRESSION:  1. Multilevel degenerative changes within the lumbar spine with  severe left-sided foraminal stenosis at L5-S1.  2.  Multilevel lateral recess stenosis worst at L4-L5.  3.  Overall, findings are similar to prior.  4.  Diffuse heterogeneous marrow signal is nonspecific, but can be seen in individuals with chronic anemia, chronic hypoxemia (such as smoking) versus other marrow infiltrative/myeloproliferative disorders.   03/27/24 - MRI THORACIC SPINE WITHOUT INTRAVENOUS CONTRAST CLINICAL HISTORY: Mid-back pain, infection suspected, positive xray/CT.   FINDINGS:   BONES AND ALIGNMENT: Normal alignment. Normal vertebral body heights. Bone marrow signal is unremarkable. No abnormal enhancement.   SPINAL CORD: Normal spinal cord volume. Normal spinal cord signal.   SOFT TISSUES: Unremarkable.   DEGENERATIVE CHANGES: There is a left posterolateral disc protrusion at T11-12, which is impressing upon the ventral surface of the spinal cord and causing moderate left-sided spinal canal stenosis. The protruded disc measures approximately 6 mm in AP dimension and 13 mm in transverse dimension and 19 mm in Craniocaudad length.   There is minimal posterior disc bulging also present at T3-4, T5-6, T6-7, T7-8, T8-9, T9-10 and T10-11, with very mild central spinal canal stenosis at each level, but no impingement upon the spinal cord or nerve roots.   IMPRESSION: 1. Left posterolateral disc protrusion at T11-12 impressing upon the ventral surface of the spinal cord and causing moderate left-sided spinal  canal stenosis. 2. Minimal posterior disc bulging at T3-4, T5-6, T6-7, T7-8, T8-9, T9-10, and T10-11 with very mild central spinal canal stenosis at each level, without impingement upon the spinal cord or nerve roots.  03/27/24 - MRI brain WO contrast    CLINICAL HISTORY: Neuro deficit, acute, stroke suspected.   FINDINGS:   BRAIN AND VENTRICLES: No convincing evidence of restricted diffusion. No acute infarct. No intracranial hemorrhage. No mass. No  midline shift. No hydrocephalus. The sella is unremarkable. Normal flow voids.  No acute abnormality.  There is mild mucosal disease within the floor of the maxillary sinuses. No acute abnormality. Normal marrow signal. No acute soft tissue abnormality.   IMPRESSION: 1. No acute intracranial abnormality. 2. Mild mucosal disease within the floor of the maxillary sinuses.  COGNITION: Overall cognitive status: Difficulty to assess due to: no family present and No family/caregiver present to determine baseline cognitive functioning   SENSATION:    COORDINATION:   POSTURE:  rounded shoulders, forward head, and flexed trunk   LUMBAR ROM:   Active  A/PROM  eval  Flexion Hands to distal thighs  Extension 80% limited - p!  Right lateral flexion 75% limited   Left lateral flexion 75% limited - p! on R  Right rotation 30% limited - p! on R  Left rotation 50% limited - p! on R   (Blank rows = not tested)  MUSCLE LENGTH: (08/19/24) Hamstrings: mod tight B ITB: mod/severe tight B Piriformis: mod/severe tight B Hip flexors: mod/severe tight B Quads: mod tight B Heelcord:   LOWER EXTREMITY ROM:    Proximal LE ROM limited due to tightness as above  LOWER EXTREMITY MMT:    MMT Right eval Left eval  Hip flexion 4 4+  Hip extension 4- 4  Hip abduction 4 4+  Hip adduction 4+ 4+  Hip internal rotation 4 4+  Hip external rotation 4+ 5  Knee flexion 5 5  Knee extension 5 5  Ankle dorsiflexion 4 4+  Ankle plantarflexion    Ankle inversion    Ankle eversion    (Blank rows = not tested)  BED MOBILITY:     TRANSFERS: Assistive device utilized:    Sit to stand: Modified independence Stand to sit: Modified independence Chair to chair:   Floor:    GAIT: Distance walked: clinic distances Assistive device utilized: None Level of assistance: Complete Independence Gait pattern: decreased stride length and trunk flexed Comments: exceedingly slow pace  STAIRS:  Level of  Assistance:    Psychologist, Counselling Technique:   Number of Stairs:    Height of Stairs:   Comments:   FUNCTIONAL TESTS: (08/19/24) 5 times sit to stand: 36.81 sec with need for B UE assist Timed up and go (TUG): Normal = 16.79 sec Manual = 17.78 sec Cognitive =  18.00 sec (pt only able to count backwards by 1's) 10 meter walk test: 11.13 sec Gait speed: 2.95 ft/sec with shuffling gait  PATIENT SURVEYS:  ABC scale: The Activities-Specific Balance Confidence (ABC) Scale 0% 10 20 30  40 50 60 70 80 90 100% No confidence<->completely confident  How confident are you that you will not lose your balance or become unsteady when you . . .  Date tested 08/14/2024   Walk around the house 100%  2. Walk up or down stairs 60%  3. Bend over and pick up a slipper from in front of a closet floor 80%  4. Reach for a small can off a shelf at eye level  100%  5. Stand on tip toes and reach for something above your head 90%  6. Stand on a chair and reach for something 50%  7. Sweep the floor 100%  8. Walk outside the house to a car parked in the driveway 100%  9. Get into or out of a car 100%  10. Walk across a parking lot to the mall 100%  11. Walk up or down a ramp 60%  12. Walk in a crowded mall where people rapidly walk past you 100%  13. Are bumped into by people as you walk through the mall 70%  14. Step onto or off of an escalator while you are holding onto the railing 80%  15. Step onto or off an escalator while holding onto parcels such that you cannot hold onto the railing 50%  16. Walk outside on icy sidewalks 70%  Total: #/16 1310/1600 = 81.9 %  Level of physical functioning: High    Modified Oswestry:  MODIFIED OSWESTRY DISABILITY SCALE  Date:  Score - 08/14/2024   Pain intensity 4 =  Pain medication provides me with little relief from pain.  2. Personal care (washing, dressing, etc.) 2 =  It is painful to take care of myself, and I am slow and careful.  3. Lifting 3 = Pain prevents me  from lifting heavy weights, but I can manage light to medium weights if they are conveniently positioned  4. Walking 3 =  Pain prevents me from walking more than  mile.  5. Sitting 3 =  Pain prevents me from sitting more than  hour.  6. Standing 4 =  Pain prevents me from standing more than 10 minutes.  7. Sleeping 4 =  Even when I take pain medication, I sleep less than 2 hour  8. Social Life 3 =  Pain prevents me from going out very often.  9. Traveling 2 =  My pain restricts my travel over 2 hours.  10. Employment/ Homemaking 3 = Pain prevents me from doing anything but light duties.  Total 31/50  % Disability 62.0 % - Crippling   Interpretation of scores: Score Category Description  0-20% Minimal Disability The patient can cope with most living activities. Usually no treatment is indicated apart from advice on lifting, sitting and exercise  21-40% Moderate Disability The patient experiences more pain and difficulty with sitting, lifting and standing. Travel and social life are more difficult and they may be disabled from work. Personal care, sexual activity and sleeping are not grossly affected, and the patient can usually be managed by conservative means  41-60% Severe Disability Pain remains the main problem in this group, but activities of daily living are affected. These patients require a detailed investigation  61-80% Crippled Back pain impinges on all aspects of the patients life. Positive intervention is required  81-100% Bed-bound These patients are either bed-bound or exaggerating their symptoms  Bluford FORBES Zoe DELENA Karon DELENA, et al. Surgery versus conservative management of stable thoracolumbar fracture: the PRESTO feasibility RCT. [place] ([place]): [employer]; 2021 Nov. [facility] [employer] Assessment, No. 25.62.) Appendix 3, Oswestry Disability Index category descriptors. Available from: Findjewelers.cz  Minimally Clinically Important  Difference (MCID) = 12.8%   TODAY'S TREATMENT:    08/19/2024  THERAPEUTIC ACTIVITIES: To improve functional performance.  Demonstration, verbal and tactile cues throughout for technique.  5 times sit to stand: 36.81 sec with need for B UE assist Timed up and go (TUG): Normal = 16.79 sec Manual =  17.78 sec Cognitive =  18.00 sec (pt only able to count backwards by 1's) 10 meter walk test: 11.13 sec Gait speed: 2.95 ft/sec with shuffling gait  PHYSICAL PERFORMANCE TEST or MEASUREMENT: Dynamic Gait Index  Level Surface Mild Impairment   Change in Gait Speed Moderate Impairment   Gait with Horizontal Head Turns Mild Impairment   Gait with Vertical Head Turns Moderate Impairment   Gait and Pivot Turn Mild Impairment   Step Over Obstacle Moderate Impairment   Step Around Obstacles Normal   Steps Mild Impairment   Total Score 14   DGI comment: Scores of 19 or less are predicitve of falls in older community living adults.       THERAPEUTIC EXERCISE: To improve strength, endurance, ROM, and flexibility.  Demonstration, verbal and tactile cues throughout for technique.  Hooklying LTR 10 x 5- cues to avoid forcing painful ROM  Hooklying SKTC 2 x 30 B Hooklying KTOS piriformis stretch 2 x 30 B Hooklying abdominal bracing/PPT 10 x 5 - repeated cues to avoid holding breath with limited abdominal muscle activation palpated Hooklying TrA + scapular retraction 10 x 5 Hooklying bent knee fallout 10 x 3-5   08/14/2024 - Eval SELF CARE:  Reviewed eval findings and role of PT in addressing identified deficits as well as need for further assessment of deficits related to PD.    PATIENT EDUCATION:  Education details: standardized testing results and interpretation and initial HEP  Person educated: Patient Education method: Explanation, Demonstration, Verbal cues, Tactile cues, and Handouts Education comprehension: verbalized understanding, returned demonstration, verbal cues required,  tactile cues required, and needs further education  HOME EXERCISE PROGRAM: Access Code: [email or web address] URL: [email or web address] Date: 08/19/2024 Prepared by: Elijah Hidden  Exercises - Supine Lower Trunk Rotation  - 2 x daily - 7 x weekly - 5 reps - 10 sec hold - Hooklying Single Knee to Chest Stretch  - 2 x daily - 7 x weekly - 3 reps - 30 sec hold - Supine Piriformis Stretch with Foot on Ground  - 2 x daily - 7 x weekly - 3 reps - 30 sec hold - Supine Scapular Retraction  - 2 x daily - 7 x weekly - 2 sets - 10 reps - 3 sec hold - Bent Knee Fallouts with Alternating Legs  - 2 x daily - 7 x weekly - 2 sets - 10 reps - 3 sec hold   ASSESSMENT:  CLINICAL IMPRESSION: Standardized balance testing revealed patient is at risk for falls and functional decline as evidenced by the following objective test measures: 5xSTS of 36.81 sec with need for B UE assist (>15 sec indicates increased risk for falls and decreased BLE power) and DGI score of 14/24 (scores of 19 or less are predictive of falls in older community living adults).  Gait speed was 2.95 ft/sec during testing with however pace during testing was faster than his typical pace with walking. TUG scores also indicating increased fall risk with TUG normal of 16.79 sec (>13.5 sec indicates increased risk for falls), TUG Manual of 17.78 sec (difference between TUG manual and TUG >4.5 seconds indicates increased fall risk), and TUG cognitive of 18.00 sec (>/= 15 seconds indicates high risk for falls and community dwelling older adults) all above fall risk thresholds.  Patient unable to complete cognitive TUG with accurate mental calculations, counting backward by 1's instead of 3's.  Remainder of session focusing on gentle thoracolumbar ROM, flexibility/stretching and core/postural muscle activation to help alleviate his  thoracolumbar back pain, however patient having difficulty isolating appropriate degree of motion to create gentle  stretch without increasing pain.  Initial HEP instructions provided with patient cautioned to avoid motions which increase pain and to report back to PT at next visit if modifications necessary to HEP.  Detravion will benefit from continued skilled PT to address ongoing thoracolumbar and lumbopelvic flexibility and ROM, strength, coordination, and balance deficits to improve mobility and activity tolerance with decreased pain interference and decreased risk for falls.   EVAL:  Kashaun Bebo is a 68 y.o. male who was referred to physical therapy for evaluation and treatment for Parkinson's disease/Parkinsonism.  Patient was diagnosed with Parkinson's within the last year.  He also continues to experience chronic back pain following decompressive thoracic laminectomy T11-12 on 03/31/24 for a L posterolateral disc protrusion at T11-12 impressing upon the ventral surface of the spinal cord and causing moderate left-sided spinal canal stenosis.  Pain is worse with prolonged postioning in lying, sitting or standing.  Patient has deficits in thoracolumbar ROM, abnormal posture, and R proximal LE radicular pain affecting ADLs and daily mobility.  On Modified Oswestry patient scored 31/50 demonstrating 62% or crippling disability.  Unable to complete full neuro assessment due to initially focusing on patient's c/o LBP, however observation of and testing so far reveal patient also presents with physical impairments of decreased timing and coordination of gait, impaired ambulation, impaired standing balance, abnormal posture, bradykinesia with transfers, impaired activity tolerance, LE weakness, postural instability and decreased safety awareness impacting safe and independent functional mobility.  Formal assessment of standardized balance testing to be completed next visit. ABC scale score of 81.9% indicates a high level of physical functioning, however patient observation does not fully correlate with reported confidence  levels.  Serenity will benefit from skilled PT to address above deficits to improve mobility and activity tolerance to help reach the maximal level of functional independence with mobility and gait with reduced risk for falls.  Patient demonstrates understanding of this POC and is in agreement with this plan.   OBJECTIVE IMPAIRMENTS: Abnormal gait, decreased activity tolerance, decreased balance, decreased endurance, decreased knowledge of condition, decreased knowledge of use of DME, decreased mobility, difficulty walking, decreased ROM, decreased strength, decreased safety awareness, increased fascial restrictions, impaired perceived functional ability, increased muscle spasms, impaired flexibility, improper body mechanics, postural dysfunction, and pain.   ACTIVITY LIMITATIONS: carrying, lifting, bending, sitting, standing, squatting, sleeping, stairs, transfers, bed mobility, bathing, dressing, locomotion level, and caring for others  PARTICIPATION LIMITATIONS: meal prep, cleaning, laundry, driving, shopping, community activity, and church  PERSONAL FACTORS: Age, Behavior pattern, Past/current experiences, Time since onset of injury/illness/exacerbation, and 3+ comorbidities: Parkinsonism/Parkinson's disease, h/o PRES 06/2023, resting tremor - L hand (non-dominant side), HTN, gout, lumbar hernia, GERD, back pain, CKD-IIIb, severely neuro-cognitively impairment are also affecting patient's functional outcome.     REHAB POTENTIAL: Fair - limited self-awareness of deficits  CLINICAL DECISION MAKING: Unstable/unpredictable  EVALUATION COMPLEXITY: High   GOALS: Goals reviewed with patient? Yes  SHORT TERM GOALS: Target date: 09/25/2024  Patient will be independent with initial HEP. Baseline: TBD Goal status: IN PROGRESS - 08/19/24 - HEP initiated  2.  Patient will report 25% reduction in thoracolumbar back pain to allow for improve QOL. Baseline: 9/10 Goal status: INITIAL  3.  Patient will  be educated on strategies to decrease risk of falls.  Baseline:  Goal status: INITIAL  4.  Patient will demonstrate decreased fall risk by scoring < 13.5 sec on TUG. Baseline:  08/19/24 - 16.79 sec (normal TUG) Goal status: REVISED - 08/19/24  LONG TERM GOALS: Target date: 11/06/2024  Patient will be independent with ongoing/advanced HEP for self-management at home incorporating PWR! Moves as indicated .  Baseline:  Goal status: INITIAL  2.  Patient will demonstrate gait speed of >/= 3.0 ft/sec with good reciprocal gait pattern to be a safe limited community ambulator with decreased risk for recurrent falls.  Baseline: 08/19/24 - 2.95 ft/sec with shuffling gait Goal status: REVISED - 08/19/24  3.  Patient will improve 5xSTS time to </= 20 seconds to demonstrate improved functional strength and transfer efficiency. Baseline: 08/19/24 - 36.81 sec with need for B UE assist Goal status: IN PROGRESS  4.  Patient will demonstrate at least 19/24 on DGI to improve gait stability and reduce risk for falls. Baseline: 08/19/24 - 14/24 Goal status: IN PROGRESS  5.  Patient will report </= 50% on Modified Oswestry to demonstrate improving functional activity tolerance. Baseline: 31 / 50 = 62.0 % Goal status: INITIAL  6.  Patient will report at least 50% reduction in thoracolumbar back pain to allow for improve QOL. Baseline: 9/10 Goal status: INITIAL   7.  Patient will verbalize understanding of local Parkinson's disease community resources, including community fitness post d/c. Baseline:  Goal status: INITIAL   PLAN:  PT FREQUENCY: 2x/week  PT DURATION: 12 weeks  PLANNED INTERVENTIONS: 97164- PT Re-evaluation, 97750- Physical Performance Testing, 97110-Therapeutic exercises, 97530- Therapeutic activity, 97112- Neuromuscular re-education, 97535- Self Care, 02859- Manual therapy, [phone]- Gait training, [phone]- Electrical stimulation (unattended), 97035- Ultrasound, 02966- Ionotophoresis 4mg /ml  Dexamethasone , 79439 (1-2 muscles), [id number] (3+ muscles)- Dry Needling, Patient/Family education, Balance training, Stair training, Taping, Joint mobilization, Spinal mobilization, Scar mobilization, DME instructions, Cryotherapy, and Moist heat  PLAN FOR NEXT SESSION: Review and update/modify initial HEP to address core, thoracolumbar and lumbopelvic flexibility and strength; MT and/or modalities for pain management PRN   Elijah CHRISTELLA Hidden, PT 08/19/2024, 6:05 PM  "

## 2024-08-22 ENCOUNTER — Ambulatory Visit: Admitting: Physical Therapy

## 2024-08-22 ENCOUNTER — Telehealth: Payer: Self-pay | Admitting: Physical Therapy

## 2024-08-22 NOTE — Telephone Encounter (Signed)
 PT called Aaron Marquez regarding no show for today's appointment at 9:30.  Patient states he thought his appointment was at 1:00 today.  Patient informed that this clinic closes at 12:00 on Fridays.  Offered patient an appointment for Monday, 08/25/24 at 10:15 which he accepted.  Elijah EMERSON Hidden, PT 08/22/2024, 10:18 AM  [facility] [address]  [address] [place], [place], [address] Phone: [phone]   Fax:  [phone]

## 2024-08-25 ENCOUNTER — Telehealth: Payer: Self-pay | Admitting: *Deleted

## 2024-08-25 ENCOUNTER — Ambulatory Visit: Admitting: Physical Therapy

## 2024-08-25 NOTE — Telephone Encounter (Signed)
 [employer] form completed for Parkinsonism syndrome.  (The form is for critical illness).  To Dr Chalice for completion/ signature. I do not have the FMLA (wife said that would be done).  Will defer to medical records.

## 2024-08-27 ENCOUNTER — Ambulatory Visit

## 2024-08-27 NOTE — Telephone Encounter (Signed)
 I called to ask wife how much time does she need daily edsel /monthly to take care of her spouse. Could not LM (VM full.

## 2024-08-27 NOTE — Telephone Encounter (Signed)
 FMLA form completed (needs how much time allowed ) and signed for wife of pt for her to care for pt.

## 2024-08-28 ENCOUNTER — Telehealth: Payer: Self-pay | Admitting: *Deleted

## 2024-08-28 NOTE — Telephone Encounter (Signed)
 Pt [employer] form faxed on 08/28/2024 [phone]

## 2024-09-01 ENCOUNTER — Ambulatory Visit: Admitting: Physical Therapy

## 2024-09-04 NOTE — Telephone Encounter (Signed)
 FMLA signed, placed in MR for pick up

## 2024-09-08 ENCOUNTER — Ambulatory Visit

## 2024-09-11 ENCOUNTER — Ambulatory Visit: Admitting: Physical Therapy

## 2024-09-15 ENCOUNTER — Ambulatory Visit: Admitting: Physical Therapy

## 2024-09-25 ENCOUNTER — Encounter: Admitting: Psychology

## 2025-02-03 ENCOUNTER — Ambulatory Visit: Admitting: Neurology
